# Patient Record
Sex: Male | Born: 1985 | Race: Black or African American | Hispanic: No | Marital: Single | Smoking: Never smoker
Health system: Southern US, Community
[De-identification: ages and names within clinical notes are randomized; demographics above are authoritative.]

## PROBLEM LIST (undated history)

## (undated) DIAGNOSIS — I509 Heart failure, unspecified: Secondary | ICD-10-CM

## (undated) DIAGNOSIS — I10 Essential (primary) hypertension: Secondary | ICD-10-CM

## (undated) DIAGNOSIS — E7800 Pure hypercholesterolemia, unspecified: Secondary | ICD-10-CM

## (undated) DIAGNOSIS — E119 Type 2 diabetes mellitus without complications: Secondary | ICD-10-CM

## (undated) HISTORY — PX: NO PAST SURGERIES: SHX2092

---

## 2009-06-02 ENCOUNTER — Emergency Department (HOSPITAL_COMMUNITY): Admission: EM | Admit: 2009-06-02 | Discharge: 2009-06-02 | Payer: Self-pay | Admitting: Emergency Medicine

## 2014-09-16 ENCOUNTER — Encounter (HOSPITAL_COMMUNITY): Payer: Self-pay | Admitting: *Deleted

## 2014-09-16 ENCOUNTER — Inpatient Hospital Stay (HOSPITAL_COMMUNITY)
Admission: EM | Admit: 2014-09-16 | Discharge: 2014-09-18 | DRG: 637 | Disposition: A | Discharge status: Home / Self Care | Payer: 59 | Attending: Internal Medicine | Admitting: Internal Medicine

## 2014-09-16 ENCOUNTER — Encounter (HOSPITAL_COMMUNITY): Payer: Self-pay | Admitting: Family Medicine

## 2014-09-16 ENCOUNTER — Encounter: Payer: Self-pay | Admitting: Podiatry

## 2014-09-16 ENCOUNTER — Ambulatory Visit (INDEPENDENT_AMBULATORY_CARE_PROVIDER_SITE_OTHER): Payer: 59

## 2014-09-16 ENCOUNTER — Ambulatory Visit (INDEPENDENT_AMBULATORY_CARE_PROVIDER_SITE_OTHER): Payer: 59 | Admitting: Podiatry

## 2014-09-16 VITALS — BP 138/73 | HR 67 | Temp 99.8°F | Resp 17

## 2014-09-16 DIAGNOSIS — L0390 Cellulitis, unspecified: Secondary | ICD-10-CM | POA: Diagnosis present

## 2014-09-16 DIAGNOSIS — R52 Pain, unspecified: Secondary | ICD-10-CM

## 2014-09-16 DIAGNOSIS — E1165 Type 2 diabetes mellitus with hyperglycemia: Secondary | ICD-10-CM | POA: Diagnosis present

## 2014-09-16 DIAGNOSIS — Z9114 Patient's other noncompliance with medication regimen: Secondary | ICD-10-CM | POA: Diagnosis present

## 2014-09-16 DIAGNOSIS — A419 Sepsis, unspecified organism: Secondary | ICD-10-CM | POA: Diagnosis present

## 2014-09-16 DIAGNOSIS — IMO0001 Reserved for inherently not codable concepts without codable children: Secondary | ICD-10-CM | POA: Diagnosis present

## 2014-09-16 DIAGNOSIS — M86171 Other acute osteomyelitis, right ankle and foot: Secondary | ICD-10-CM

## 2014-09-16 DIAGNOSIS — E780 Pure hypercholesterolemia: Secondary | ICD-10-CM | POA: Diagnosis present

## 2014-09-16 DIAGNOSIS — I10 Essential (primary) hypertension: Secondary | ICD-10-CM | POA: Diagnosis present

## 2014-09-16 DIAGNOSIS — E11621 Type 2 diabetes mellitus with foot ulcer: Principal | ICD-10-CM | POA: Diagnosis present

## 2014-09-16 DIAGNOSIS — L03115 Cellulitis of right lower limb: Secondary | ICD-10-CM | POA: Diagnosis present

## 2014-09-16 DIAGNOSIS — E1142 Type 2 diabetes mellitus with diabetic polyneuropathy: Secondary | ICD-10-CM | POA: Diagnosis present

## 2014-09-16 DIAGNOSIS — Z794 Long term (current) use of insulin: Secondary | ICD-10-CM

## 2014-09-16 DIAGNOSIS — L02611 Cutaneous abscess of right foot: Secondary | ICD-10-CM

## 2014-09-16 DIAGNOSIS — E1169 Type 2 diabetes mellitus with other specified complication: Secondary | ICD-10-CM | POA: Diagnosis present

## 2014-09-16 DIAGNOSIS — IMO0002 Reserved for concepts with insufficient information to code with codable children: Secondary | ICD-10-CM

## 2014-09-16 DIAGNOSIS — Z79899 Other long term (current) drug therapy: Secondary | ICD-10-CM | POA: Diagnosis not present

## 2014-09-16 DIAGNOSIS — E785 Hyperlipidemia, unspecified: Secondary | ICD-10-CM | POA: Diagnosis present

## 2014-09-16 DIAGNOSIS — L97519 Non-pressure chronic ulcer of other part of right foot with unspecified severity: Secondary | ICD-10-CM | POA: Diagnosis present

## 2014-09-16 DIAGNOSIS — L97509 Non-pressure chronic ulcer of other part of unspecified foot with unspecified severity: Secondary | ICD-10-CM

## 2014-09-16 DIAGNOSIS — M868X7 Other osteomyelitis, ankle and foot: Secondary | ICD-10-CM | POA: Diagnosis present

## 2014-09-16 DIAGNOSIS — L03119 Cellulitis of unspecified part of limb: Secondary | ICD-10-CM

## 2014-09-16 HISTORY — DX: Essential (primary) hypertension: I10

## 2014-09-16 HISTORY — DX: Type 2 diabetes mellitus without complications: E11.9

## 2014-09-16 HISTORY — DX: Pure hypercholesterolemia, unspecified: E78.00

## 2014-09-16 LAB — CBC WITH DIFFERENTIAL/PLATELET
BASOS ABS: 0 10*3/uL (ref 0.0–0.1)
Basophils Relative: 0 % (ref 0–1)
Eosinophils Absolute: 0.1 10*3/uL (ref 0.0–0.7)
Eosinophils Relative: 1 % (ref 0–5)
HCT: 40.1 % (ref 39.0–52.0)
Hemoglobin: 13.4 g/dL (ref 13.0–17.0)
Lymphocytes Relative: 17 % (ref 12–46)
Lymphs Abs: 2.5 10*3/uL (ref 0.7–4.0)
MCH: 26.9 pg (ref 26.0–34.0)
MCHC: 33.4 g/dL (ref 30.0–36.0)
MCV: 80.5 fL (ref 78.0–100.0)
Monocytes Absolute: 1.2 10*3/uL — ABNORMAL HIGH (ref 0.1–1.0)
Monocytes Relative: 8 % (ref 3–12)
NEUTROS ABS: 10.9 10*3/uL — AB (ref 1.7–7.7)
Neutrophils Relative %: 74 % (ref 43–77)
PLATELETS: 285 10*3/uL (ref 150–400)
RBC: 4.98 MIL/uL (ref 4.22–5.81)
RDW: 13.4 % (ref 11.5–15.5)
WBC: 14.7 10*3/uL — AB (ref 4.0–10.5)

## 2014-09-16 LAB — COMPREHENSIVE METABOLIC PANEL
ALK PHOS: 91 U/L (ref 39–117)
ALT: 18 U/L (ref 0–53)
AST: 13 U/L (ref 0–37)
Albumin: 4 g/dL (ref 3.5–5.2)
Anion gap: 12 (ref 5–15)
BUN: 11 mg/dL (ref 6–23)
CALCIUM: 10 mg/dL (ref 8.4–10.5)
CO2: 27 mEq/L (ref 19–32)
Chloride: 98 mEq/L (ref 96–112)
Creatinine, Ser: 0.86 mg/dL (ref 0.50–1.35)
GFR calc Af Amer: >90 mL/min (ref >90)
Glucose, Bld: 285 mg/dL — ABNORMAL HIGH (ref 70–99)
Potassium: 4.5 mEq/L (ref 3.7–5.3)
SODIUM: 137 meq/L (ref 137–147)
Total Bilirubin: 0.4 mg/dL (ref 0.3–1.2)
Total Protein: 8.3 g/dL (ref 6.0–8.3)

## 2014-09-16 LAB — GLUCOSE, CAPILLARY: Glucose-Capillary: 288 mg/dL — ABNORMAL HIGH (ref 70–99)

## 2014-09-16 LAB — CBG MONITORING, ED
GLUCOSE-CAPILLARY: 158 mg/dL — AB (ref 70–99)
Glucose-Capillary: 278 mg/dL — ABNORMAL HIGH (ref 70–99)

## 2014-09-16 LAB — HEMOGLOBIN A1C
Hgb A1c MFr Bld: 10 % — ABNORMAL HIGH (ref <5.7)
MEAN PLASMA GLUCOSE: 240 mg/dL — AB (ref <117)

## 2014-09-16 LAB — SEDIMENTATION RATE: Sed Rate: 38 mm/hr — ABNORMAL HIGH (ref 0–16)

## 2014-09-16 MED ORDER — ENOXAPARIN SODIUM 40 MG/0.4ML ~~LOC~~ SOLN
40.0000 mg | SUBCUTANEOUS | Status: DC
  Administered 2014-09-16 – 2014-09-17 (×2): 40 mg via SUBCUTANEOUS
  Filled 2014-09-16 (×5): qty 0.4

## 2014-09-16 MED ORDER — HYDROCODONE-ACETAMINOPHEN 5-325 MG PO TABS
1.0000 | ORAL_TABLET | ORAL | Status: DC | PRN
  Filled 2014-09-16: qty 2

## 2014-09-16 MED ORDER — VANCOMYCIN HCL IN DEXTROSE 1-5 GM/200ML-% IV SOLN
1000.0000 mg | Freq: Once | INTRAVENOUS | Status: AC
  Administered 2014-09-16: 1000 mg via INTRAVENOUS
  Filled 2014-09-16: qty 200

## 2014-09-16 MED ORDER — SODIUM CHLORIDE 0.9 % IV BOLUS (SEPSIS)
1000.0000 mL | Freq: Once | INTRAVENOUS | Status: AC
  Administered 2014-09-16: 1000 mL via INTRAVENOUS

## 2014-09-16 MED ORDER — ATORVASTATIN CALCIUM 20 MG PO TABS
20.0000 mg | ORAL_TABLET | Freq: Every day | ORAL | Status: DC
  Administered 2014-09-17: 20 mg via ORAL
  Filled 2014-09-16: qty 1
  Filled 2014-09-16: qty 2
  Filled 2014-09-16: qty 1

## 2014-09-16 MED ORDER — SODIUM CHLORIDE 0.9 % IV SOLN
INTRAVENOUS | Status: DC
  Administered 2014-09-16: 17:00:00 via INTRAVENOUS

## 2014-09-16 MED ORDER — SODIUM CHLORIDE 0.9 % IV SOLN
INTRAVENOUS | Status: DC
  Administered 2014-09-16: 21:00:00 via INTRAVENOUS

## 2014-09-16 MED ORDER — DEXTROSE 5 % IV SOLN
2.0000 g | INTRAVENOUS | Status: DC
  Administered 2014-09-16 – 2014-09-17 (×2): 2 g via INTRAVENOUS
  Filled 2014-09-16 (×4): qty 2

## 2014-09-16 MED ORDER — LINAGLIPTIN 5 MG PO TABS
5.0000 mg | ORAL_TABLET | Freq: Every day | ORAL | Status: DC
  Administered 2014-09-17: 5 mg via ORAL
  Filled 2014-09-16: qty 1

## 2014-09-16 MED ORDER — PIPERACILLIN-TAZOBACTAM 4.5 G IVPB
4.5000 g | Freq: Once | INTRAVENOUS | Status: AC
  Administered 2014-09-16: 4.5 g via INTRAVENOUS
  Filled 2014-09-16: qty 100

## 2014-09-16 MED ORDER — AMLODIPINE BESY-BENAZEPRIL HCL 10-40 MG PO CAPS: 1.0000 | ORAL_CAPSULE | Freq: Every day | ORAL | Status: DC

## 2014-09-16 MED ORDER — BENAZEPRIL HCL 40 MG PO TABS
40.0000 mg | ORAL_TABLET | Freq: Every day | ORAL | Status: DC
Start: 2014-09-16 — End: 2014-09-18
  Administered 2014-09-16 – 2014-09-18 (×3): 40 mg via ORAL
  Filled 2014-09-16 (×4): qty 1

## 2014-09-16 MED ORDER — SITAGLIPTIN PHOS-METFORMIN HCL 50-1000 MG PO TABS: 1.0000 | ORAL_TABLET | Freq: Two times a day (BID) | ORAL | Status: DC

## 2014-09-16 MED ORDER — GLIPIZIDE 10 MG PO TABS
10.0000 mg | ORAL_TABLET | Freq: Two times a day (BID) | ORAL | Status: DC
Start: 2014-09-17 — End: 2014-09-17
  Administered 2014-09-17: 10 mg via ORAL
  Filled 2014-09-16 (×3): qty 1

## 2014-09-16 MED ORDER — ONDANSETRON HCL 4 MG/2ML IJ SOLN: 4.0000 mg | Freq: Four times a day (QID) | INTRAMUSCULAR | Status: DC | PRN

## 2014-09-16 MED ORDER — METFORMIN HCL 500 MG PO TABS
1000.0000 mg | ORAL_TABLET | Freq: Two times a day (BID) | ORAL | Status: DC
  Administered 2014-09-17: 1000 mg via ORAL
  Filled 2014-09-16 (×3): qty 2

## 2014-09-16 MED ORDER — ONDANSETRON HCL 4 MG PO TABS: 4.0000 mg | ORAL_TABLET | Freq: Four times a day (QID) | ORAL | Status: DC | PRN

## 2014-09-16 MED ORDER — INSULIN ASPART 100 UNIT/ML ~~LOC~~ SOLN
8.0000 [IU] | Freq: Once | SUBCUTANEOUS | Status: AC
  Administered 2014-09-16: 8 [IU] via INTRAVENOUS
  Filled 2014-09-16: qty 1

## 2014-09-16 MED ORDER — DAPAGLIFLOZIN PROPANEDIOL 10 MG PO TABS: 10.0000 mg | ORAL_TABLET | Freq: Every day | ORAL | Status: DC

## 2014-09-16 MED ORDER — METRONIDAZOLE IN NACL 5-0.79 MG/ML-% IV SOLN
500.0000 mg | Freq: Three times a day (TID) | INTRAVENOUS | Status: DC
  Administered 2014-09-16 – 2014-09-18 (×6): 500 mg via INTRAVENOUS
  Filled 2014-09-16 (×8): qty 100

## 2014-09-16 MED ORDER — AMLODIPINE BESYLATE 10 MG PO TABS
10.0000 mg | ORAL_TABLET | Freq: Every day | ORAL | Status: DC
Start: 2014-09-16 — End: 2014-09-18
  Administered 2014-09-16 – 2014-09-18 (×3): 10 mg via ORAL
  Filled 2014-09-16 (×3): qty 1
  Filled 2014-09-16: qty 2

## 2014-09-16 NOTE — H&P (Signed)
[facility] History and Physical  Jeffery Villa UQJ:[id number] DOB: 01/15/1986 DOA: 09/16/2014  Referring physician: ED physician PCP: No PCP Per Patient   Chief Complaint: right foot ulcer   HPI:  Pt is 28 yo male with HTN, DM type II, presented to [facility] ED from [place] office for evaluation of diabetic foot ulcer. Pt explains he has noted the small blister like wound on the side of the right foot for several weeks and has gotten progressively worse, associated with malodorous discharge, throbbing pain present only with ambulation and applying pressure to the area, also progressive erythema around the ulcer, warmth to touch noted. Pt also reports subjective fevers, chills but has not checked the temperature. He explains he ran out of the diabetic medication several days ago.  In ED, pt is hemodynamically stable, VSS, report from doctor's office indicated XRAY of the right foot with osteophyte lysis of the fifth metatarsal head c/w osteomyelitis. TRH asked to admit for further evaluation.   Assessment and Plan: Active Problems: Diabetic foot ulcer with osteomyelitis, moderate to severe - SIRS criteria met with T 99.9 F, WBC 14. 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   Code Status: Full Family Communication: Pt at bedside Disposition Plan: Admit for further evaluation     Review of Systems:  Constitutional: Negative for fever,  chills and malaise/fatigue. Negative for diaphoresis.  HENT: Negative for hearing loss, ear pain, nosebleeds, congestion, sore throat, neck pain, tinnitus and ear discharge.   Eyes: Negative for blurred vision, double vision, photophobia, pain, discharge and redness.  Respiratory: Negative for cough, hemoptysis, sputum production, shortness of breath, wheezing and stridor.   Cardiovascular: Negative for chest pain, palpitations, orthopnea, claudication and leg swelling.  Gastrointestinal: Negative for nausea, vomiting and abdominal pain.  Genitourinary: Negative for dysuria, urgency, frequency, hematuria and flank pain.  Musculoskeletal: Negative for myalgias, back pain, joint pain and falls.  Skin: Negative for itching and rash.  Neurological: Negative for dizziness and weakness.  Endo/Heme/Allergies: Negative for environmental allergies and polydipsia. Does not bruise/bleed easily.  Psychiatric/Behavioral: Negative for suicidal ideas. The patient is not nervous/anxious.      Past Medical History  Diagnosis Date  . Hypertension   . Diabetes mellitus without complication   . High cholesterol     History reviewed. No pertinent past surgical history.  Social History:  reports that he has never smoked. He does not have any smokeless tobacco history on file. His alcohol and drug histories are not on file.  No Known Allergies  No known family medical history   Prior to Admission medications   Medication Sig Start Date End Date Taking? Authorizing Provider  acetaminophen (TYLENOL) 325 MG tablet Take 650 mg by mouth every 6 (six) hours as needed.   Yes Historical Provider, MD  glipiZIDE (GLUCOTROL) 10 MG tablet Take 10 mg by mouth 2 (two) times daily.   Yes Historical Provider, MD  amLODipine-benazepril (LOTREL) 10-40 MG per  capsule Take 1 capsule by mouth daily.    Historical Provider, MD  atorvastatin (LIPITOR) 20 MG tablet Take 20 mg by mouth daily.    Historical Provider, MD   dapagliflozin propanediol (FARXIGA) 10 MG TABS tablet Take 10 mg by mouth daily.    Historical Provider, MD  sitaGLIPtin-metformin (JANUMET) 50-1000 MG per tablet Take 1 tablet by mouth 2 (two) times daily with a meal.    Historical Provider, MD    Physical Exam: Filed Vitals:   09/16/14 1554 09/16/14 1730  BP: 156/93 156/87  Pulse: 118 107  Temp: 99.4 F (37.4 C)   TempSrc: Oral   Resp: 18   SpO2: 98% 98%    Physical Exam  Constitutional: Appears well-developed and well-nourished. No distress.  HENT: Normocephalic. External right and left ear normal. Oropharynx is clear and moist.  Eyes: Conjunctivae and EOM are normal. PERRLA, no scleral icterus.  Neck: Normal ROM. Neck supple. No JVD. No tracheal deviation. No thyromegaly.  CVS: RRR, S1/S2 +, no murmurs, no gallops, no carotid bruit.  Pulmonary: Effort and breath sounds normal, no stridor, rhonchi, wheezes, rales.  Abdominal: Soft. BS +,  no distension, tenderness, rebound or guarding.  Musculoskeletal: Normal range of motion. Lateral aspect of the right foot with small open ulcer with purulent drainage, TTP and with surrounding erythema and warmth to touch  Lymphadenopathy: No lymphadenopathy noted, cervical, inguinal. Neuro: Alert. Normal reflexes, muscle tone coordination. No cranial nerve deficit. Skin: Skin is warm and dry. No rash noted. Not diaphoretic. No erythema. No pallor.  Psychiatric: Normal mood and affect. Behavior, judgment, thought content normal.   Labs on Admission:  Basic Metabolic Panel:  Recent Labs Lab 09/16/14 1620  NA 137  K 4.5  CL 98  CO2 27  GLUCOSE 285*  BUN 11  CREATININE 0.86  CALCIUM 10.0   Liver Function Tests:  Recent Labs Lab 09/16/14 1620  AST 13  ALT 18  ALKPHOS 91  BILITOT 0.4  PROT 8.3  ALBUMIN 4.0   No results for input(s): LIPASE, AMYLASE in the last 168 hours. No results for input(s): AMMONIA in the last 168 hours. CBC:  Recent Labs Lab 09/16/14 1620  WBC  14.7*  NEUTROABS 10.9*  HGB 13.4  HCT 40.1  MCV 80.5  PLT 285   Cardiac Enzymes: No results for input(s): CKTOTAL, CKMB, CKMBINDEX, TROPONINI in the last 168 hours. BNP: Invalid input(s): POCBNP CBG:  Recent Labs Lab 09/16/14 1557  GLUCAP 278*    EKG: Normal sinus rhythm, no ST/T wave changes  Faye Ramsay, MD  [facility] Pager [phone]  If 7PM-7AM, please contact night-coverage [email or web address] Password [facility] 09/16/2014, 5:47 PM

## 2014-09-16 NOTE — Progress Notes (Signed)
   Subjective:    Patient ID: Jeffery Villa, male    DOB: [birth year], 28 y.o.   MRN: [id number]  HPI 28 year old male presents the office today with complaints of a wound on his right foot which has been present for proximally one month. He states that the last couple days he hasincrease malodor, drainage, swelling and redness to his right foot and leg. He denies any systemic complaints as fevers, chills, nausea, vomiting. He is diabetic and he states his last HbA1c was 8.0. Does state that he is diabetic on insulin however he ran out of medication on November 18 and he has since not had any medication. He states that since run out of the insulin he has noticed increased symptoms. He states that he periodically gets calluses on both of his feet for which he picks at. No other complaints at this time.  Review of Systems  All other systems reviewed and are negative.      Objective:   Physical Exam AAO 3, NAD DP/PT pulses palpable bilaterally Protective sensation decreased with Simms Weinstein monofilament, Achilles tendon reflex intact Ulceration on the plantar aspect of the right foot submetatarsal 5. Small amount of sharp/scab overlying the wound with purulence identified. The area was then sharply debrided and there was probing to bone. There is malodor present. There is edema and erythema from the foot to mid leg. There is pain on palpation to the right leg. There is increase in warmth to the foot/leg. There is no specific areas of fluctuance or crepitus.  No other open lesions identified.        Assessment & Plan:  28 year old male with bilateral x-ray cellulitis, likely osteomyelitis fifth metatarsal head  -X-rays were obtained and reviewed with the patient. There is osteophyte lysis of the fifth metatarsal head consistent with early osteomyelitis.  -Treatment options were discussed. At this time recommend the patient go to the emergency department for further evaluation due to the  significant swelling to his right leg in warmth consistent with cellulitis as well as likely osteomyelitis.  -I discussed the patient that for future it is best if he follows up with me on a routine basis every 3 months to help prevent recurrence or any future problems.  -Patient verbalizes understanding to go to the hospital.

## 2014-09-16 NOTE — ED Notes (Signed)
Per pt sts worsening wound infection to right foot. sts that he has diabetes and blood sugars have been high. sts wound oozing and hard to walk on.

## 2014-09-16 NOTE — Patient Instructions (Signed)
Due to the warmth/swelling to the right leg, the wound on the bottom on the right foot with some purulence and changes on x-ray concerning for a bone infection, I recommend going to the emergency room for further evaluation.

## 2014-09-16 NOTE — ED Provider Notes (Signed)
CSN: [id number]     Arrival date & time 09/16/14  1549 History   First MD Initiated Contact with Patient 09/16/14 1604     Chief Complaint  Patient presents with  . Wound Infection     (Consider location/radiation/quality/duration/timing/severity/associated sxs/prior Treatment) The history is provided by the patient.  pt w hx diabetes, c/o diabetic foot ulcer for past several weeks, that has been slowly looking worse.  Ulcer is on plantar aspect foot, proximal to base of small toe. Pt states started as small hole that has slowly gotten larger. Now states entire foot and lower leg is 'red and hot'. Mild pain to area. Minimal purulent drainage. Was seen at foot center today and referred to ED. Denies fever or chills. States non compliant w diabetes management, has not been on meds or checked sugars for weeks/months. No nv.      Past Medical History  Diagnosis Date  . Hypertension   . Diabetes mellitus without complication   . High cholesterol    History reviewed. No pertinent past surgical history. History reviewed. No pertinent family history. History  Substance Use Topics  . Smoking status: Never Smoker   . Smokeless tobacco: Not on file  . Alcohol Use: Not on file    Review of Systems  Constitutional: Negative for fever.  HENT: Negative for sore throat.   Eyes: Negative for redness.  Respiratory: Negative for shortness of breath.   Cardiovascular: Negative for chest pain.  Gastrointestinal: Negative for vomiting, abdominal pain and diarrhea.  Endocrine: Negative for polydipsia and polyuria.  Genitourinary: Negative for flank pain.  Musculoskeletal: Negative for back pain and neck pain.  Skin: Negative for rash.  Neurological: Negative for headaches.  Hematological: Does not bruise/bleed easily.  Psychiatric/Behavioral: Negative for confusion.      Allergies  Review of patient's allergies indicates no known allergies.  Home Medications   Prior to Admission  medications   Medication Sig Start Date End Date Taking? Authorizing Provider  amLODipine-benazepril (LOTREL) 10-40 MG per capsule Take 1 capsule by mouth daily.    Historical Provider, MD  atorvastatin (LIPITOR) 20 MG tablet Take 20 mg by mouth daily.    Historical Provider, MD  dapagliflozin propanediol (FARXIGA) 10 MG TABS tablet Take 10 mg by mouth daily.    Historical Provider, MD  sitaGLIPtin-metformin (JANUMET) 50-1000 MG per tablet Take 1 tablet by mouth 2 (two) times daily with a meal.    Historical Provider, MD   BP 156/93 mmHg  Pulse 118  Temp(Src) 99.4 F (37.4 C) (Oral)  Resp 18  SpO2 98% Physical Exam  Constitutional: He is oriented to person, place, and time. He appears well-developed and well-nourished. No distress.  HENT:  Mouth/Throat: Oropharynx is clear and moist.  Eyes: Conjunctivae are normal. No scleral icterus.  Neck: Neck supple. No tracheal deviation present.  Cardiovascular: Normal rate, regular rhythm, normal heart sounds and intact distal pulses.   Pulmonary/Chest: Effort normal and breath sounds normal. No accessory muscle usage. No respiratory distress.  Abdominal: Soft. Bowel sounds are normal. He exhibits no distension.  Musculoskeletal: Normal range of motion. He exhibits no edema or tenderness.  3-4 mm diameter ulcerated area/hole on plantar aspect right foot proximal to base right small toe. Surrounding callus. Foot and lower leg w erythema and increased warmth. Distal pulses palp. Normal cap refill distally.   Neurological: He is alert and oriented to person, place, and time.  Skin: Skin is warm and dry. No rash noted.  Psychiatric: He has  a normal mood and affect.  Nursing note and vitals reviewed.   ED Course  Procedures (including critical care time) Labs Review   Results for orders placed or performed during the hospital encounter of 09/16/14  CBC with Differential  Result Value Ref Range   WBC 14.7 (H) 4.0 - 10.5 K/uL   RBC 4.98 4.22 -  5.81 MIL/uL   Hemoglobin 13.4 13.0 - 17.0 g/dL   HCT 78.240.1 95.639.0 - 21.352.0 %   MCV 80.5 78.0 - 100.0 fL   MCH 26.9 26.0 - 34.0 pg   MCHC 33.4 30.0 - 36.0 g/dL   RDW 08.613.4 57.811.5 - 46.915.5 %   Platelets 285 150 - 400 K/uL   Neutrophils Relative % 74 43 - 77 %   Neutro Abs 10.9 (H) 1.7 - 7.7 K/uL   Lymphocytes Relative 17 12 - 46 %   Lymphs Abs 2.5 0.7 - 4.0 K/uL   Monocytes Relative 8 3 - 12 %   Monocytes Absolute 1.2 (H) 0.1 - 1.0 K/uL   Eosinophils Relative 1 0 - 5 %   Eosinophils Absolute 0.1 0.0 - 0.7 K/uL   Basophils Relative 0 0 - 1 %   Basophils Absolute 0.0 0.0 - 0.1 K/uL  Comprehensive metabolic panel  Result Value Ref Range   Sodium 137 137 - 147 mEq/L   Potassium 4.5 3.7 - 5.3 mEq/L   Chloride 98 96 - 112 mEq/L   CO2 27 19 - 32 mEq/L   Glucose, Bld 285 (H) 70 - 99 mg/dL   BUN 11 6 - 23 mg/dL   Creatinine, Ser 6.290.86 0.50 - 1.35 mg/dL   Calcium 52.810.0 8.4 - 41.310.5 mg/dL   Total Protein 8.3 6.0 - 8.3 g/dL   Albumin 4.0 3.5 - 5.2 g/dL   AST 13 0 - 37 U/L   ALT 18 0 - 53 U/L   Alkaline Phosphatase 91 39 - 117 U/L   Total Bilirubin 0.4 0.3 - 1.2 mg/dL   GFR calc non Af Amer >90 >90 mL/min   GFR calc Af Amer >90 >90 mL/min   Anion gap 12 5 - 15  CBG monitoring, ED  Result Value Ref Range   Glucose-Capillary 278 (H) 70 - 99 mg/dL      MDM  Iv ns.   Labs.  Reviewed nursing notes and prior charts for additional history.   Given cellulitis in setting infected diabetic foot wound, ?underlying osteo, uncontrolled diabetes in patient w poor compliance and no pcp - will admit to med service for iv abx, wound care/consult, and diabetes management/teaching.  Iv zosyn and vanc in ED.  Iv ns bolus. novolog sq.     Suzi RootsKevin E Yardley Beltran, MD 09/16/14 [phone]

## 2014-09-17 ENCOUNTER — Inpatient Hospital Stay (HOSPITAL_COMMUNITY): Payer: 59

## 2014-09-17 DIAGNOSIS — M9080 Osteopathy in diseases classified elsewhere, unspecified site: Secondary | ICD-10-CM

## 2014-09-17 DIAGNOSIS — E1169 Type 2 diabetes mellitus with other specified complication: Secondary | ICD-10-CM

## 2014-09-17 DIAGNOSIS — E11621 Type 2 diabetes mellitus with foot ulcer: Principal | ICD-10-CM

## 2014-09-17 DIAGNOSIS — I10 Essential (primary) hypertension: Secondary | ICD-10-CM

## 2014-09-17 DIAGNOSIS — A419 Sepsis, unspecified organism: Secondary | ICD-10-CM

## 2014-09-17 DIAGNOSIS — M869 Osteomyelitis, unspecified: Secondary | ICD-10-CM

## 2014-09-17 DIAGNOSIS — L97509 Non-pressure chronic ulcer of other part of unspecified foot with unspecified severity: Secondary | ICD-10-CM

## 2014-09-17 DIAGNOSIS — E1165 Type 2 diabetes mellitus with hyperglycemia: Secondary | ICD-10-CM

## 2014-09-17 LAB — GLUCOSE, CAPILLARY
GLUCOSE-CAPILLARY: 283 mg/dL — AB (ref 70–99)
GLUCOSE-CAPILLARY: 161 mg/dL — AB (ref 70–99)
Glucose-Capillary: 335 mg/dL — ABNORMAL HIGH (ref 70–99)
Glucose-Capillary: 168 mg/dL — ABNORMAL HIGH (ref 70–99)

## 2014-09-17 LAB — CBC
HCT: 37.2 % — ABNORMAL LOW (ref 39.0–52.0)
Hemoglobin: 12.4 g/dL — ABNORMAL LOW (ref 13.0–17.0)
MCH: 26.8 pg (ref 26.0–34.0)
MCHC: 33.3 g/dL (ref 30.0–36.0)
MCV: 80.5 fL (ref 78.0–100.0)
PLATELETS: 264 10*3/uL (ref 150–400)
RBC: 4.62 MIL/uL (ref 4.22–5.81)
RDW: 13.6 % (ref 11.5–15.5)
WBC: 10.3 10*3/uL (ref 4.0–10.5)

## 2014-09-17 LAB — BASIC METABOLIC PANEL
ANION GAP: 14 (ref 5–15)
BUN: 10 mg/dL (ref 6–23)
CO2: 24 mEq/L (ref 19–32)
Calcium: 9.1 mg/dL (ref 8.4–10.5)
Chloride: 99 mEq/L (ref 96–112)
Creatinine, Ser: 0.78 mg/dL (ref 0.50–1.35)
GFR calc non Af Amer: >90 mL/min (ref >90)
GLUCOSE: 323 mg/dL — AB (ref 70–99)
POTASSIUM: 4 meq/L (ref 3.7–5.3)
Sodium: 137 mEq/L (ref 137–147)

## 2014-09-17 LAB — HEMOGLOBIN A1C
Hgb A1c MFr Bld: 10.4 % — ABNORMAL HIGH (ref <5.7)
Mean Plasma Glucose: 252 mg/dL — ABNORMAL HIGH (ref <117)

## 2014-09-17 MED ORDER — HYDROCODONE-ACETAMINOPHEN 5-325 MG PO TABS
1.0000 | ORAL_TABLET | ORAL | Status: DC | PRN
  Administered 2014-09-17: 1 via ORAL
  Filled 2014-09-17: qty 1

## 2014-09-17 MED ORDER — INSULIN ASPART 100 UNIT/ML ~~LOC~~ SOLN
0.0000 [IU] | Freq: Three times a day (TID) | SUBCUTANEOUS | Status: DC
  Administered 2014-09-17: 3 [IU] via SUBCUTANEOUS
  Administered 2014-09-18 (×2): 5 [IU] via SUBCUTANEOUS

## 2014-09-17 MED ORDER — INSULIN GLARGINE 100 UNIT/ML ~~LOC~~ SOLN
20.0000 [IU] | Freq: Every day | SUBCUTANEOUS | Status: DC
  Administered 2014-09-17 – 2014-09-18 (×2): 20 [IU] via SUBCUTANEOUS
  Filled 2014-09-17 (×2): qty 0.2

## 2014-09-17 MED ORDER — INSULIN ASPART 100 UNIT/ML ~~LOC~~ SOLN
3.0000 [IU] | Freq: Three times a day (TID) | SUBCUTANEOUS | Status: DC
  Administered 2014-09-18 (×2): 3 [IU] via SUBCUTANEOUS

## 2014-09-17 MED ORDER — INSULIN ASPART 100 UNIT/ML ~~LOC~~ SOLN: 0.0000 [IU] | Freq: Every day | SUBCUTANEOUS | Status: DC

## 2014-09-17 MED ORDER — ADULT MULTIVITAMIN W/MINERALS CH
1.0000 | ORAL_TABLET | Freq: Every day | ORAL | Status: DC
  Administered 2014-09-18: 1 via ORAL
  Filled 2014-09-17: qty 1

## 2014-09-17 NOTE — Consult Note (Signed)
Reason for Consult: right foot diabetic ulcer Referring Physician: Abigail Marsiglia Jeffery Villa is an 28 y.o. male.  HPI: 28 year old male with a new-onset diabetic ulcer under the right 5th toe MTP joint. Patient reports increase in pain and temperature of the foot over the last few days. The patient was seen at the foot center and a simple debridement of the ulcer was performed.  Due to a worsening clinical course, the patient presented to the ED and was admitted for IV antibiotics and further clinical workup.   Past Medical History  Diagnosis Date  . Hypertension   . Diabetes mellitus without complication   . High cholesterol     History reviewed. No pertinent past surgical history.  History reviewed. No pertinent family history.  Social History:  reports that he has never smoked. He does not have any smokeless tobacco history on file. His alcohol and drug histories are not on file.  Allergies: No Known Allergies  Medications: I have reviewed the patient's current medications.  Results for orders placed or performed during the hospital encounter of 09/16/14 (from the past 48 hour(s))  CBG monitoring, ED     Status: Abnormal   Collection Time: 09/16/14  3:57 PM  Result Value Ref Range   Glucose-Capillary 278 (H) 70 - 99 mg/dL  CBC with Differential     Status: Abnormal   Collection Time: 09/16/14  4:20 PM  Result Value Ref Range   WBC 14.7 (H) 4.0 - 10.5 K/uL   RBC 4.98 4.22 - 5.81 MIL/uL   Hemoglobin 13.4 13.0 - 17.0 g/dL   HCT 40.1 39.0 - 52.0 %   MCV 80.5 78.0 - 100.0 fL   MCH 26.9 26.0 - 34.0 pg   MCHC 33.4 30.0 - 36.0 g/dL   RDW 13.4 11.5 - 15.5 %   Platelets 285 150 - 400 K/uL   Neutrophils Relative % 74 43 - 77 %   Neutro Abs 10.9 (H) 1.7 - 7.7 K/uL   Lymphocytes Relative 17 12 - 46 %   Lymphs Abs 2.5 0.7 - 4.0 K/uL   Monocytes Relative 8 3 - 12 %   Monocytes Absolute 1.2 (H) 0.1 - 1.0 K/uL   Eosinophils Relative 1 0 - 5 %   Eosinophils Absolute 0.1 0.0 - 0.7  K/uL   Basophils Relative 0 0 - 1 %   Basophils Absolute 0.0 0.0 - 0.1 K/uL  Comprehensive metabolic panel     Status: Abnormal   Collection Time: 09/16/14  4:20 PM  Result Value Ref Range   Sodium 137 137 - 147 mEq/L   Potassium 4.5 3.7 - 5.3 mEq/L   Chloride 98 96 - 112 mEq/L   CO2 27 19 - 32 mEq/L   Glucose, Bld 285 (H) 70 - 99 mg/dL   BUN 11 6 - 23 mg/dL   Creatinine, Ser 0.86 0.50 - 1.35 mg/dL   Calcium 10.0 8.4 - 10.5 mg/dL   Total Protein 8.3 6.0 - 8.3 g/dL   Albumin 4.0 3.5 - 5.2 g/dL   AST 13 0 - 37 U/L   ALT 18 0 - 53 U/L   Alkaline Phosphatase 91 39 - 117 U/L   Total Bilirubin 0.4 0.3 - 1.2 mg/dL   GFR calc non Af Amer >90 >90 mL/min   GFR calc Af Amer >90 >90 mL/min    Comment: (NOTE) The eGFR has been calculated using the CKD EPI equation. This calculation has not been validated in all clinical situations.  eGFR's persistently <90 mL/min signify possible Chronic Kidney Disease.    Anion gap 12 5 - 15  Hemoglobin A1c     Status: Abnormal   Collection Time: 09/16/14  4:20 PM  Result Value Ref Range   Hgb A1c MFr Bld 10.0 (H) <5.7 %    Comment: (NOTE)                                                                       According to the ADA Clinical Practice Recommendations for 2011, when HbA1c is used as a screening test:  >=6.5%   Diagnostic of Diabetes Mellitus           (if abnormal result is confirmed) 5.7-6.4%   Increased risk of developing Diabetes Mellitus References:Diagnosis and Classification of Diabetes Mellitus,Diabetes TXHF,4142,39(RVUYE 1):S62-S69 and Standards of Medical Care in         Diabetes - 2011,Diabetes Care,2011,34 (Suppl 1):S11-S61.    Mean Plasma Glucose 240 (H) <117 mg/dL    Comment: Performed at [facility]  CBG monitoring, ED     Status: Abnormal   Collection Time: 09/16/14  7:16 PM  Result Value Ref Range   Glucose-Capillary 158 (H) 70 - 99 mg/dL   Comment 1 Notify RN    Comment 2 Documented in Chart   Sedimentation  rate     Status: Abnormal   Collection Time: 09/16/14  8:21 PM  Result Value Ref Range   Sed Rate 38 (H) 0 - 16 mm/hr  Glucose, capillary     Status: Abnormal   Collection Time: 09/16/14  9:59 PM  Result Value Ref Range   Glucose-Capillary 288 (H) 70 - 99 mg/dL   Comment 1 Notify RN   Basic metabolic panel     Status: Abnormal   Collection Time: 09/17/14  4:47 AM  Result Value Ref Range   Sodium 137 137 - 147 mEq/L   Potassium 4.0 3.7 - 5.3 mEq/L   Chloride 99 96 - 112 mEq/L   CO2 24 19 - 32 mEq/L   Glucose, Bld 323 (H) 70 - 99 mg/dL   BUN 10 6 - 23 mg/dL   Creatinine, Ser 0.78 0.50 - 1.35 mg/dL   Calcium 9.1 8.4 - 10.5 mg/dL   GFR calc non Af Amer >90 >90 mL/min   GFR calc Af Amer >90 >90 mL/min    Comment: (NOTE) The eGFR has been calculated using the CKD EPI equation. This calculation has not been validated in all clinical situations. eGFR's persistently <90 mL/min signify possible Chronic Kidney Disease.    Anion gap 14 5 - 15  CBC     Status: Abnormal   Collection Time: 09/17/14  4:47 AM  Result Value Ref Range   WBC 10.3 4.0 - 10.5 K/uL   RBC 4.62 4.22 - 5.81 MIL/uL   Hemoglobin 12.4 (L) 13.0 - 17.0 g/dL   HCT 37.2 (L) 39.0 - 52.0 %   MCV 80.5 78.0 - 100.0 fL   MCH 26.8 26.0 - 34.0 pg   MCHC 33.3 30.0 - 36.0 g/dL   RDW 13.6 11.5 - 15.5 %   Platelets 264 150 - 400 K/uL  Glucose, capillary     Status: Abnormal   Collection Time: 09/17/14  8:02 AM  Result Value Ref Range   Glucose-Capillary 335 (H) 70 - 99 mg/dL   Comment 1 Notify RN   Glucose, capillary     Status: Abnormal   Collection Time: 09/17/14 11:42 AM  Result Value Ref Range   Glucose-Capillary 283 (H) 70 - 99 mg/dL    Dg Foot Complete Right  09/16/2014   3 views of a skeletally mature individual were obtained of the right foot.  Study includes DP, oblique, lateral projections.  There is mild ostial lysis of the fifth metatarsal head consistent with  possible early osteomyelitis. No soft tissue  emphysema. Calcification seen  at the anterior aspect of the leg in the soft tissues.   ROS Blood pressure 148/96, pulse 98, temperature 98.9 F (37.2 C), temperature source Oral, resp. rate 16, height '6\' 2"'  (1.88 m), weight 103 kg (227 lb 1.2 oz), SpO2 98 %. Physical Exam  Right foot: generalized swelling of the foot and ankle.  Increased temperature of the right foot compared to the left foot.  There is a 1cm shallow ulcer under the 5th MT head.  No fluctuance and no active drainage.  No erythema noted.  Pulses present. No specific swelling in the 5th toe.  Assessment/Plan: Right foot ulcer in diabetic male with poor diabetic control.  Rule out osteomyelitis and abscess.  Recommend MRI at this time to evaluate for these possibilities.  Continue local wound care and antibiotics.  I have discussed this case with Dr Sharol Given who is available for surgical consultation if needed.  Jeffery Villa,STEVEN R 09/17/2014, 4:14 PM

## 2014-09-17 NOTE — Care Management (Signed)
09-17-14 Consult for PCP .   Confirmed patient does have [facility] insurance , faxed copy of card ( front and back ) to admitting .   Provided patient with [facility] phone number [phone] . Patient can call [facility] if he wants a [facility] MD in network , or he can call number on insurance card to be provided with a list of PCP's in network.   Explained to patient if he knows a MD he wants to see he can call MD office directly to see if he/ she are accepting new patients with UMR.   Also provided [facility] and [facility] information.   Patient stated he will decide on a MD.   Ronny Flurry RN BSN [phone]

## 2014-09-17 NOTE — Consult Note (Addendum)
WOC consulted for the neuropathic foot ulcer.  Evaluation per podiatry 09/16/14 as outpatient recommended inpatient admission for management.   Xray: There is mild ostial lysis of the fifth metatarsal head consistent with possible early osteomyelitis The treatment of osteomyelitis is considered outside of the scope of practice for the [facility] nurse. Would recommend orthopedic evaluation at this time.  May need MRI for further evaluation, contacted hospitalist for consultation to orthopedics.    Re consult if needed, will not follow at this time. Thanks  Thurmond Hildebran [employer], CWOCN [phone])

## 2014-09-17 NOTE — Plan of Care (Signed)
Problem: Food- and Nutrition-Related Knowledge Deficit (NB-1.1) Goal: Nutrition education Formal process to instruct or train a patient/client in a skill or to impart knowledge to help patients/clients voluntarily manage or modify food choices and eating behavior to maintain or improve health. Outcome: Completed/Met Date Met:  09/17/14  RD consulted for nutrition education regarding diabetes.   Pt has had DM since 15 but was non-compliant for a time. Pt plans to seek care from [facility] and [facility]. Pt ready to make changes and needs review of DM diet.  Pt with good appetite eating well.  Blood sugar control will be the most helpful for healing his ulcer.  Will add MVI.     Lab Results  Component Value Date    HGBA1C 10.0* 09/16/2014    RD provided "Carbohydrate Counting for People with Diabetes" handout from the Academy of Nutrition and Dietetics. Discussed different food groups and their effects on blood sugar, emphasizing carbohydrate-containing foods. Provided list of carbohydrates and recommended serving sizes of common foods.  Discussed importance of controlled and consistent carbohydrate intake throughout the day. Provided examples of ways to balance meals/snacks and encouraged intake of high-fiber, whole grain complex carbohydrates. Teach back method used.  Expect good compliance.  Body mass index is 29.14 kg/(m^2). Pt meets criteria for overweight based on current BMI.  Current diet order is CHO modified, patient is consuming approximately 100% of meals at this time. Labs and medications reviewed. No further nutrition interventions warranted at this time. RD contact information provided. If additional nutrition issues arise, please re-consult RD.  [place], [place], [place] Pager [phone] After Hours Pager

## 2014-09-17 NOTE — Plan of Care (Signed)
Problem: Phase I Progression Outcomes Goal: Pain controlled with appropriate interventions Outcome: Progressing     

## 2014-09-17 NOTE — Progress Notes (Addendum)
Inpatient Diabetes Program Recommendations  AACE/ADA: New Consensus Statement on Inpatient Glycemic Control (2013)  Target Ranges:  Prepandial:   less than 140 mg/dL      Peak postprandial:   less than 180 mg/dL (1-2 hours)      Critically ill patients:  140 - 180 mg/dL   Reason for Visit:  Referral received for Diabetes.  A1C=10.0%.    Diabetes history: Type 2 diabetes Outpatient Diabetes medications: Janumet 50-1000 mg bid, Glipizide 10 mg bid, Farxiga 10 mg daily Current orders for Inpatient glycemic control:   Glucotrol 10 mg bid, Tradgenta 5 mg daily, Metformin 1000 mg bid   CBG's elevated.  Note patient admitted for a diabetic foot ulcer.  Text-paged MD to request basal, bolus and correction insulin.  Ordered "Living Well with Diabetes" booklet for patient and will talk to patient regarding diabetes, survival skills and follow-up.  Thanks,  Beryl Meager, RN, BC-ADM Inpatient Diabetes Coordinator Pager [phone]   Addendum:  Spoke at length to patient regarding diabetes.  He has had diabetes since age 28 and states that he was on insulin until age 62.  When he went to college he stopped taking insulin and states "I was in denial and did not want to have diabetes any more".  He has seen MD at family practice in [place] who prescribed the oral diabetes medications for him.  He states that he does not have a meter and has not been checking CBG's but noticed that when he ran out of his medications on 11/17, his foot ulcer got much worse.  He states that he is motivated and wants to get his diabetes under control.  He states that he is willing to go back on insulin if necessary.  He has used insulin pens in the past.  Will need Rx. For medications and meter at discharge.  May also benefit from consult with endocrinologist? Will follow.  Gave him information regarding "Link to Wellness" program and also gave referral to [facility] case manager.

## 2014-09-17 NOTE — Progress Notes (Signed)
Received referral from DM Inpatient Coordinator for [facility] Care Mangement/ Link to Wellness program. Patient has [employer] insurance. Therefore, eligible for Link to Wellness program for DM, HTN, HLD. Fortunately DM Coordinator had already given patient packet and discussed program. Micah Flesher to bedside to reiterate the benefits of Link to [employer] and consents were signed. Patient endorses he does not have a PCP locally and that he has been without his medications since the November 17th. States he has tried to call his PCP in [place] without returned calls. Encouraged patient to make follow appointment with [facility] for follow up sooner than later. Discussed that he will need all of his prescriptions prior to discharge as he does not have any refills on his medications prior to admission. Patient will receive call for appointment to be arranged for Link to [facility] Coordinator for further folllow up. Left contact information and [facility] Care Management folder and Link to Wellness brochure with patient as well. Confirmed contact information.Will make inpatient RNCM aware of bedside encounter. Raiford Noble, MSN- RN,BSN- [facility] [phone]

## 2014-09-17 NOTE — Progress Notes (Signed)
PROGRESS NOTE    Jeffery Villa OTL:[id number] DOB: 26-Jun-1986 DOA: 09/16/2014 PCP: No PCP Per Patient  HPI/Brief narrative 28 year old male, security personnel at [employer], with history of essential hypertension, type II DM with possible peripheral neuropathy, Medication noncompliance, presented from wound care center to ED with complaints of worsening pain and drainage from chronic right foot wound. X-ray of right foot suggests osteomyelitis. Orthopedics/Dr. Veverly Fells consulted.  Assessment/Plan:  1. 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 Continue IV Rocephin and Flagyl as per foot ulcer protocol. Consulted Dr. Veverly Fells, orthopedics for further evaluation and management. 2. Poorly controlled type II DM with peripheral neuropathy: DC oral hypoglycemics while inpatient. Start patient on Lantus, NovoLog mealtime and SSI. Diabetes coordinator input is appreciated. Patient extensively counseled regarding need for compliance with all medical management to avoid further complications. He verbalized understanding. He has apparently been a diabetic since age 31 and used to be on insulins. We should discharge on insulin secondary to ongoing acute infection and A1C 10. 3. Essential hypertension: Mildly uncontrolled. Continue amlodipine, benazepril. 4. History of hyperlipidemia: Continue statins 5. Medication noncompliance: Counseled.   Code Status: Full Family Communication: None at bedside Disposition Plan: Home when medically stable   Consultants:  Orthopedics  Procedures:  None  Antibiotics:  IV Rocephin  IV Flagyl   Subjective: Mild right foot pain.  Objective: Filed Vitals:   09/16/14 1830 09/16/14 1900 09/16/14 1945 09/17/14 0531  BP: 134/84 142/95 154/97 144/90  Pulse: 100 104 102 93  Temp:   98.7 F (37.1 C) 98.9 F (37.2 C)    TempSrc:   Oral Oral  Resp:   18 17  Height:   '6\' 2"'  (1.88 m)   Weight:   103 kg (227 lb 1.2 oz)   SpO2: 97% 98% 97% 97%    Intake/Output Summary (Last 24 hours) at 09/17/14 1211 Last data filed at 09/17/14 1053  Gross per 24 hour  Intake 1413.75 ml  Output      0 ml  Net 1413.75 ml   Filed Weights   09/16/14 1945  Weight: 103 kg (227 lb 1.2 oz)     Exam:  General exam: pleasant moderately built and obese male patient sitting up comfortably in bed. Respiratory system: Clear. No increased work of breathing. Cardiovascular system: S1 & S2 heard, RRR. No JVD, murmurs, gallops, clicks or pedal edema. Gastrointestinal system: Abdomen is nondistended, soft and nontender. Normal bowel sounds heard. Central nervous system: Alert and oriented. No focal neurological deficits. Extremities: Symmetric 5 x 5 power. Approximately 1 cm diameter wound with callus around, plantar aspect of right fifth MTP with no tenderness or active drainage or other acute signs at this time.    Data Reviewed: Basic Metabolic Panel:  Recent Labs Lab 09/16/14 1620 09/17/14 0447  NA 137 137  K 4.5 4.0  CL 98 99  CO2 27 24  GLUCOSE 285* 323*  BUN 11 10  CREATININE 0.86 0.78  CALCIUM 10.0 9.1   Liver Function Tests:  Recent Labs Lab 09/16/14 1620  AST 13  ALT 18  ALKPHOS 91  BILITOT 0.4  PROT 8.3  ALBUMIN 4.0   No results for input(s): LIPASE, AMYLASE in the last 168 hours. No results for input(s): AMMONIA in the last 168 hours. CBC:  Recent Labs Lab 09/16/14 1620 09/17/14 0447  WBC 14.7* 10.3  NEUTROABS 10.9*  --  HGB 13.4 12.4*  HCT 40.1 37.2*  MCV 80.5 80.5  PLT 285 264   Cardiac Enzymes: No results for input(s): CKTOTAL, CKMB, CKMBINDEX, TROPONINI in the last 168 hours. BNP (last 3 results) No results for input(s): PROBNP in the last 8760 hours. CBG:  Recent Labs Lab 2014/10/07 1557 10-07-2014 1916 2014/10/07 2159 09/17/14 0802 09/17/14 1142  GLUCAP 278* 158* 288*  335* 283*    No results found for this or any previous visit (from the past 240 hour(s)).    Additional labs: 1. ESR 38 2. A1C; 10     Studies: Dg Foot Complete Right  10-07-2014   3 views of a skeletally mature individual were obtained of the right foot.  Study includes DP, oblique, lateral projections.  There is mild ostial lysis of the fifth metatarsal head consistent with  possible early osteomyelitis. No soft tissue emphysema. Calcification seen  at the anterior aspect of the leg in the soft tissues.       Scheduled Meds: . amLODipine  10 mg Oral Daily  . atorvastatin  20 mg Oral q1800  . benazepril  40 mg Oral Daily  . cefTRIAXone (ROCEPHIN)  IV  2 g Intravenous Q24H   And  . metronidazole  500 mg Intravenous Q8H  . dapagliflozin propanediol  10 mg Oral Daily  . enoxaparin (LOVENOX) injection  40 mg Subcutaneous Q24H  . glipiZIDE  10 mg Oral BID WC  . insulin aspart  0-15 Units Subcutaneous TID WC  . insulin aspart  0-5 Units Subcutaneous QHS  . insulin aspart  3 Units Subcutaneous TID WC  . insulin glargine  20 Units Subcutaneous Daily  . linagliptin  5 mg Oral Daily  . metFORMIN  1,000 mg Oral BID WC   Continuous Infusions: . sodium chloride 75 mL/hr at 09/17/14 0242    Active Problems:   Cellulitis    Time spent: 50 minutes.    Vernell Leep, MD, FACP, FHM. [facility] Pager [phone]  If 7PM-7AM, please contact night-coverage [email or web address] Password [email or web address] 09/17/2014, 12:11 PM    LOS: 1 day

## 2014-09-18 ENCOUNTER — Other Ambulatory Visit (HOSPITAL_COMMUNITY): Payer: Self-pay | Admitting: Orthopedic Surgery

## 2014-09-18 DIAGNOSIS — L97509 Non-pressure chronic ulcer of other part of unspecified foot with unspecified severity: Secondary | ICD-10-CM

## 2014-09-18 DIAGNOSIS — E1342 Other specified diabetes mellitus with diabetic polyneuropathy: Secondary | ICD-10-CM

## 2014-09-18 DIAGNOSIS — L03115 Cellulitis of right lower limb: Secondary | ICD-10-CM

## 2014-09-18 DIAGNOSIS — IMO0001 Reserved for inherently not codable concepts without codable children: Secondary | ICD-10-CM | POA: Diagnosis present

## 2014-09-18 DIAGNOSIS — E13621 Other specified diabetes mellitus with foot ulcer: Secondary | ICD-10-CM

## 2014-09-18 DIAGNOSIS — E11621 Type 2 diabetes mellitus with foot ulcer: Secondary | ICD-10-CM | POA: Diagnosis present

## 2014-09-18 DIAGNOSIS — E1142 Type 2 diabetes mellitus with diabetic polyneuropathy: Secondary | ICD-10-CM

## 2014-09-18 DIAGNOSIS — G629 Polyneuropathy, unspecified: Secondary | ICD-10-CM

## 2014-09-18 DIAGNOSIS — Z794 Long term (current) use of insulin: Secondary | ICD-10-CM

## 2014-09-18 DIAGNOSIS — E1065 Type 1 diabetes mellitus with hyperglycemia: Secondary | ICD-10-CM

## 2014-09-18 DIAGNOSIS — L97519 Non-pressure chronic ulcer of other part of right foot with unspecified severity: Secondary | ICD-10-CM

## 2014-09-18 DIAGNOSIS — E1165 Type 2 diabetes mellitus with hyperglycemia: Secondary | ICD-10-CM | POA: Diagnosis present

## 2014-09-18 LAB — CBC
HCT: 36.8 % — ABNORMAL LOW (ref 39.0–52.0)
HEMOGLOBIN: 12.2 g/dL — AB (ref 13.0–17.0)
MCH: 26 pg (ref 26.0–34.0)
MCHC: 33.2 g/dL (ref 30.0–36.0)
MCV: 78.5 fL (ref 78.0–100.0)
Platelets: 272 10*3/uL (ref 150–400)
RBC: 4.69 MIL/uL (ref 4.22–5.81)
RDW: 13.5 % (ref 11.5–15.5)
WBC: 10.7 10*3/uL — ABNORMAL HIGH (ref 4.0–10.5)

## 2014-09-18 LAB — GLUCOSE, CAPILLARY
GLUCOSE-CAPILLARY: 225 mg/dL — AB (ref 70–99)
GLUCOSE-CAPILLARY: 202 mg/dL — AB (ref 70–99)

## 2014-09-18 LAB — C-REACTIVE PROTEIN: CRP: 2.7 mg/dL — AB (ref <0.60)

## 2014-09-18 LAB — HIV ANTIBODY (ROUTINE TESTING W REFLEX): HIV 1&2 Ab, 4th Generation: NONREACTIVE

## 2014-09-18 MED ORDER — LIVING WELL WITH DIABETES BOOK
Freq: Once | Status: AC
  Administered 2014-09-18: 15:00:00
  Filled 2014-09-18: qty 1

## 2014-09-18 MED ORDER — AMLODIPINE BESY-BENAZEPRIL HCL 10-40 MG PO CAPS: 1.0000 | ORAL_CAPSULE | Freq: Every day | ORAL | Status: DC

## 2014-09-18 MED ORDER — FREESTYLE SYSTEM KIT: 1.0000 | PACK | Status: DC | PRN

## 2014-09-18 MED ORDER — INSULIN STARTER KIT- PEN NEEDLES (ENGLISH)
1.0000 | Freq: Once | Status: AC
  Administered 2014-09-18: 1
  Filled 2014-09-18: qty 1

## 2014-09-18 MED ORDER — ATORVASTATIN CALCIUM 20 MG PO TABS
20.0000 mg | ORAL_TABLET | Freq: Every day | ORAL | Status: DC
Start: 2014-09-18 — End: 2022-03-09

## 2014-09-18 MED ORDER — CEPHALEXIN 500 MG PO CAPS: 500.0000 mg | ORAL_CAPSULE | Freq: Three times a day (TID) | ORAL | Status: DC

## 2014-09-18 MED ORDER — DOXYCYCLINE HYCLATE 100 MG PO TABS: 100.0000 mg | ORAL_TABLET | Freq: Two times a day (BID) | ORAL | Status: DC

## 2014-09-18 MED ORDER — INSULIN GLARGINE 100 UNIT/ML SOLOSTAR PEN: 60.0000 [IU] | PEN_INJECTOR | Freq: Every day | SUBCUTANEOUS | Status: DC

## 2014-09-18 MED ORDER — METFORMIN HCL 1000 MG PO TABS: 1000.0000 mg | ORAL_TABLET | Freq: Two times a day (BID) | ORAL | Status: DC

## 2014-09-18 MED ORDER — INSULIN PEN NEEDLE 30G X 5 MM MISC: Status: DC

## 2014-09-18 NOTE — Progress Notes (Signed)
Discharge instructions and Rx's reviewed with the patient and significant other at the bedside. Pt asked appropriate questions. Pt ready for discharge.

## 2014-09-18 NOTE — Progress Notes (Signed)
   Subjective:     Right foot ulcer Dr. Ranell Patrick spoke with Dr. Lajoyce Corners about managing care  MRI shows small ulcer extending down to towards the 5th metatarsal with only slight edema suggesting possible early osteomyelitis Pt doing well today Denies any new symptoms or issues   Patient reports pain as mild.  Objective:   VITALS:   Filed Vitals:   09/18/14 0526  BP: 152/87  Pulse: 87  Temp: 98.2 F (36.8 C)  Resp: 18    Right foot with small ulcer at 5th metatarsal head nv intact distally   LABS  Recent Labs  09/16/14 1620 09/17/14 0447 09/18/14 0538  HGB 13.4 12.4* 12.2*  HCT 40.1 37.2* 36.8*  WBC 14.7* 10.3 10.7*  PLT 285 264 272     Recent Labs  09/16/14 1620 09/17/14 0447  NA 137 137  K 4.5 4.0  BUN 11 10  CREATININE 0.86 0.78  GLUCOSE 285* 323*     Assessment/Plan:    right foot ulcer with possible early osteomyelitis Recommend continue IV antibiotics  Dr. Lajoyce Corners to evaluate for further disposition Continue wound care to right foot   Alphonsa Overall, MPAS, PA-C  09/18/2014, 11:15 AM

## 2014-09-18 NOTE — Discharge Summary (Signed)
Physician Discharge Summary  Jeffery Villa ZOX:[id number] DOB: 1986/06/07 DOA: 09/16/2014  PCP: No PCP Per Patient  Admit date: 09/16/2014 Discharge date: 09/18/2014  Time spent: 40 minutes  Recommendations for Outpatient Follow-up:  1. Follow-up with primary care physician within one week.  Discharge Diagnoses:  Principal Problem:   Diabetic foot ulcer Active Problems:   Cellulitis   Diabetes mellitus, insulin dependent (IDDM), uncontrolled   Diabetic peripheral neuropathy   Discharge Condition: Stable  Diet recommendation: Carbohydrate modified diet  Filed Weights   09/16/14 1945  Weight: 103 kg (227 lb 1.2 oz)    History of present illness:  Pt is 28 yo male with HTN, DM type II, presented to [facility] ED from doctor's office for evaluation of diabetic foot ulcer. Pt explains he has noted the small blister like wound on the side of the right foot for several weeks and has gotten progressively worse, associated with malodorous discharge, throbbing pain present only with ambulation and applying pressure to the area, also progressive erythema around the ulcer, warmth to touch noted. Pt also reports subjective fevers, chills but has not checked the temperature. He explains he ran out of the diabetic medication several days ago.  In ED, pt is hemodynamically stable, VSS, report from doctor's office indicated XRAY of the right foot with osteophyte lysis of the fifth metatarsal head c/w osteomyelitis. TRH asked to admit for further evaluation.   Hospital Course:   Diabetic foot ulcer with osteomyelitis Patient has right lower extremity nonhealing ulcer for about 1-2 month. Patient has diabetic insensate neuropathy. Seen by Dr. Lajoyce Corners recommended right fifth ray amputation. Patient wants to go home because of the Thanksgiving holiday, probably for surgery next week. Patient discharged on Keflex and doxycycline to follow-up with Lajoyce Corners next Wednesday probably right fifth ray  amputation.  Sepsis Present at the time of admission, temp was 99.9, WBCs was 14.7, HR was 120 and evidence of osteomyelitis on x-ray. Sepsis physiology resolved after antibiotics started and patient aggressively hydrated with IV fluid.  Diabetes mellitus Atypical presentation, patient said was diagnosed at age 78 year old and he was on insulin. Patient reported for the past 2 years he's been on oral hypoglycemic. Likely MODY, anyway patient will need insulin, he was on 60 units of Lantus before restarted.  Hyperlipidemia Understanding continue, likely will improve after insulin started.  Hypertension On Lotrel (amlodipine and benazepril) continue regimen, stable blood pressure.  Procedures:  None  Consultations:  Dr. Lajoyce Corners  Discharge Exam: Filed Vitals:   09/18/14 1341  BP: 145/105  Pulse: 92  Temp: 98.4 F (36.9 C)  Resp: 18   General: Alert and awake, oriented x3, not in any acute distress. HEENT: anicteric sclera, pupils reactive to light and accommodation, EOMI CVS: S1-S2 clear, no murmur rubs or gallops Chest: clear to auscultation bilaterally, no wheezing, rales or rhonchi Abdomen: soft nontender, nondistended, normal bowel sounds, no organomegaly Extremities: no cyanosis, clubbing or edema noted bilaterally Neuro: Cranial nerves II-XII intact, no focal neurological deficits  Discharge Instructions You were cared for by a hospitalist during your hospital stay. If you have any questions about your discharge medications or the care you received while you were in the hospital after you are discharged, you can call the unit and asked to speak with the hospitalist on call if the hospitalist that took care of you is not available. Once you are discharged, your primary care physician will handle any further medical issues. Please note that NO REFILLS for any discharge medications will be  authorized once you are discharged, as it is imperative that you return to your primary  care physician (or establish a relationship with a primary care physician if you do not have one) for your aftercare needs so that they can reassess your need for medications and monitor your lab values.  Discharge Instructions    Ambulatory referral to Nutrition and Diabetic Education    Complete by:  As directed   Will need 1:1.  Diabetes since age 7.     Diet Carb Modified    Complete by:  As directed      Increase activity slowly    Complete by:  As directed           Current Discharge Medication List    START taking these medications   Details  cephALEXin (KEFLEX) 500 MG capsule Take 1 capsule (500 mg total) by mouth 3 (three) times daily. Qty: 21 capsule, Refills: 0    doxycycline (VIBRA-TABS) 100 MG tablet Take 1 tablet (100 mg total) by mouth 2 (two) times daily. Qty: 14 tablet, Refills: 0    Insulin Glargine (LANTUS SOLOSTAR) 100 UNIT/ML Solostar Pen Inject 60 Units into the skin daily at 10 pm. Qty: 15 mL, Refills: 0    metFORMIN (GLUCOPHAGE) 1000 MG tablet Take 1 tablet (1,000 mg total) by mouth 2 (two) times daily with a meal. Qty: 60 tablet, Refills: 0      CONTINUE these medications which have CHANGED   Details  amLODipine-benazepril (LOTREL) 10-40 MG per capsule Take 1 capsule by mouth daily. Qty: 30 capsule, Refills: 0    atorvastatin (LIPITOR) 20 MG tablet Take 1 tablet (20 mg total) by mouth daily. Qty: 30 tablet, Refills: 0      CONTINUE these medications which have NOT CHANGED   Details  acetaminophen (TYLENOL) 325 MG tablet Take 650 mg by mouth every 6 (six) hours as needed.      STOP taking these medications     glipiZIDE (GLUCOTROL) 10 MG tablet      dapagliflozin propanediol (FARXIGA) 10 MG TABS tablet      sitaGLIPtin-metformin (JANUMET) 50-1000 MG per tablet        No Known Allergies Follow-up Information    Follow up with DUDA,MARCUS V, MD In 1 week.   Specialty:  Orthopedic Surgery   Contact information:   [address] [place] [address] [phone]        The results of significant diagnostics from this hospitalization (including imaging, microbiology, ancillary and laboratory) are listed below for reference.    Significant Diagnostic Studies: Mr Foot Right Wo Contrast  09/18/2014   CLINICAL DATA:  Pt is 28 yo male with HTN, DM type II, presented to [facility] ED from doctor's office for evaluation of diabetic foot ulcer. Pt explains he has noted the small blister like wound on the side of the right foot for several weeks and has gotten progressively worse, associated with malodorous discharge, throbbing pain present only with ambulation and applying pressure to the area, also progressive erythema around the ulcer, warmth to touch noted. Pt also reports subjective fevers, chills but has not checked the temperature. He explains he ran out of the diabetic medication several days ago. report from doctor's office indicated XRAY of the right foot with osteophyte lysis of the fifth metatarsal head c/w osteomyelitis.  EXAM: MRI OF THE RIGHT FOREFOOT WITHOUT CONTRAST  TECHNIQUE: Multiplanar, multisequence MR imaging was performed. No intravenous contrast was administered.  COMPARISON:  None.  FINDINGS: There is generalized soft tissue edema along the dorsal aspect of the midfoot and forefoot. There is a soft tissue ulcer along the plantar lateral aspect of the right forefoot at the level of the fifth MTP joint. The soft tissue abnormality extends to very cortex of the fifth metatarsal head. There is abnormal T1 and T2 signal within the fifth metatarsal head and the base of the fifth proximal phalanx with a small joint effusion.  There is mild osteoarthritis of the first MTP joint. There is no other marrow signal abnormality. There is no fracture or dislocation. There is no focal fluid collection to suggest an abscess. There is no soft tissue mass or hematoma. The visualized portions of the flexor and extensor compartment  tendons are intact.  IMPRESSION: 1. Soft tissue ulcer along the plantar lateral aspect of the right forefoot at the level of the fifth MTP joint extending down to the cortex of the fifth metatarsal head. There is no definite cortical destruction, but there is abnormal T1 and T2 signal within the fifth metatarsal head and fifth proximal phalanx which may be reactive versus early osteomyelitis. No drainable abscess.   Electronically Signed   By: Elige KoHetal  Patel   On: 09/18/2014 07:43   Dg Foot Complete Right  09/16/2014   3 views of a skeletally mature individual were obtained of the right foot.  Study includes DP, oblique, lateral projections.  There is mild ostial lysis of the fifth metatarsal head consistent with  possible early osteomyelitis. No soft tissue emphysema. Calcification seen  at the anterior aspect of the leg in the soft tissues.   Microbiology: Recent Results (from the past 240 hour(s))  Blood Cultures x 2 sites     Status: None (Preliminary result)   Collection Time: 09/16/14  8:07 PM  Result Value Ref Range Status   Specimen Description BLOOD RIGHT HAND  Final   Special Requests BOTTLES DRAWN AEROBIC ONLY 5CC  Final   Culture  Setup Time   Final    09/17/2014 04:37 Performed at [facility]    Culture   Final           BLOOD CULTURE RECEIVED NO GROWTH TO DATE CULTURE WILL BE HELD FOR 5 DAYS BEFORE ISSUING A FINAL NEGATIVE REPORT Performed at [facility]    Report Status PENDING  Incomplete  Blood Cultures x 2 sites     Status: None (Preliminary result)   Collection Time: 09/16/14  8:16 PM  Result Value Ref Range Status   Specimen Description BLOOD LEFT HAND  Final   Special Requests BOTTLES DRAWN AEROBIC ONLY 5CC  Final   Culture  Setup Time   Final    09/17/2014 04:37 Performed at [facility]    Culture   Final           BLOOD CULTURE RECEIVED NO GROWTH TO DATE CULTURE WILL BE HELD FOR 5 DAYS BEFORE ISSUING A FINAL NEGATIVE REPORT Performed  at [facility]    Report Status PENDING  Incomplete     Labs: Basic Metabolic Panel:  Recent Labs Lab 09/16/14 1620 09/17/14 0447  NA 137 137  K 4.5 4.0  CL 98 99  CO2 27 24  GLUCOSE 285* 323*  BUN 11 10  CREATININE 0.86 0.78  CALCIUM 10.0 9.1   Liver Function Tests:  Recent Labs Lab 09/16/14 1620  AST 13  ALT 18  ALKPHOS 91  BILITOT 0.4  PROT 8.3  ALBUMIN 4.0  No results for input(s): LIPASE, AMYLASE in the last 168 hours. No results for input(s): AMMONIA in the last 168 hours. CBC:  Recent Labs Lab 09/16/14 1620 09/17/14 0447 09/18/14 0538  WBC 14.7* 10.3 10.7*  NEUTROABS 10.9*  --   --   HGB 13.4 12.4* 12.2*  HCT 40.1 37.2* 36.8*  MCV 80.5 80.5 78.5  PLT 285 264 272   Cardiac Enzymes: No results for input(s): CKTOTAL, CKMB, CKMBINDEX, TROPONINI in the last 168 hours. BNP: BNP (last 3 results) No results for input(s): PROBNP in the last 8760 hours. CBG:  Recent Labs Lab 09/17/14 1142 09/17/14 1708 09/17/14 2131 09/18/14 0739 09/18/14 1128  GLUCAP 283* 161* 168* 225* 202*       Signed:  Fran Neiswonger A  [facility] 09/18/2014, 2:49 PM

## 2014-09-18 NOTE — Progress Notes (Signed)
VASCULAR LAB PRELIMINARY  ARTERIAL  ABI completed:  ABIs and pedal waveforms normal.      RIGHT    LEFT    PRESSURE WAVEFORM  PRESSURE WAVEFORM  BRACHIAL 160 Triphasic  BRACHIAL 156 Triphasic   DP 196 Triphasic  DP 176 Triphasic   AT   AT    PT 205 Triphasic  PT 192 Triphasic   PER   PER    GREAT TOE 157 NA GREAT TOE 153 NA    RIGHT LEFT  ABI 0.98 0.96     Less [place], RVT 09/18/2014, 9:34 AM

## 2014-09-18 NOTE — Consult Note (Signed)
Reason for Consult: Ulceration purulent drainage right foot fifth metatarsal head Referring Physician: Dr. Reatha Armour Jeffery Villa is an 27 y.o. male.  HPI: Patient is a 28 year old gentleman diabetic insensate neuropathy who presents with purulent drainage from an ulceration fifth metatarsal head right foot  Past Medical History  Diagnosis Date  . Hypertension   . Diabetes mellitus without complication   . High cholesterol     History reviewed. No pertinent past surgical history.  History reviewed. No pertinent family history.  Social History:  reports that he has never smoked. He does not have any smokeless tobacco history on file. His alcohol and drug histories are not on file.  Allergies: No Known Allergies  Medications: I have reviewed the patient's current medications.  Results for orders placed or performed during the hospital encounter of 09/16/14 (from the past 48 hour(s))  CBG monitoring, ED     Status: Abnormal   Collection Time: 09/16/14  3:57 PM  Result Value Ref Range   Glucose-Capillary 278 (H) 70 - 99 mg/dL  CBC with Differential     Status: Abnormal   Collection Time: 09/16/14  4:20 PM  Result Value Ref Range   WBC 14.7 (H) 4.0 - 10.5 K/uL   RBC 4.98 4.22 - 5.81 MIL/uL   Hemoglobin 13.4 13.0 - 17.0 g/dL   HCT 40.1 39.0 - 52.0 %   MCV 80.5 78.0 - 100.0 fL   MCH 26.9 26.0 - 34.0 pg   MCHC 33.4 30.0 - 36.0 g/dL   RDW 13.4 11.5 - 15.5 %   Platelets 285 150 - 400 K/uL   Neutrophils Relative % 74 43 - 77 %   Neutro Abs 10.9 (H) 1.7 - 7.7 K/uL   Lymphocytes Relative 17 12 - 46 %   Lymphs Abs 2.5 0.7 - 4.0 K/uL   Monocytes Relative 8 3 - 12 %   Monocytes Absolute 1.2 (H) 0.1 - 1.0 K/uL   Eosinophils Relative 1 0 - 5 %   Eosinophils Absolute 0.1 0.0 - 0.7 K/uL   Basophils Relative 0 0 - 1 %   Basophils Absolute 0.0 0.0 - 0.1 K/uL  Comprehensive metabolic panel     Status: Abnormal   Collection Time: 09/16/14  4:20 PM  Result Value Ref Range   Sodium 137  137 - 147 mEq/L   Potassium 4.5 3.7 - 5.3 mEq/L   Chloride 98 96 - 112 mEq/L   CO2 27 19 - 32 mEq/L   Glucose, Bld 285 (H) 70 - 99 mg/dL   BUN 11 6 - 23 mg/dL   Creatinine, Ser 0.86 0.50 - 1.35 mg/dL   Calcium 10.0 8.4 - 10.5 mg/dL   Total Protein 8.3 6.0 - 8.3 g/dL   Albumin 4.0 3.5 - 5.2 g/dL   AST 13 0 - 37 U/L   ALT 18 0 - 53 U/L   Alkaline Phosphatase 91 39 - 117 U/L   Total Bilirubin 0.4 0.3 - 1.2 mg/dL   GFR calc non Af Amer >90 >90 mL/min   GFR calc Af Amer >90 >90 mL/min    Comment: (NOTE) The eGFR has been calculated using the CKD EPI equation. This calculation has not been validated in all clinical situations. eGFR's persistently <90 mL/min signify possible Chronic Kidney Disease.    Anion gap 12 5 - 15  Hemoglobin A1c     Status: Abnormal   Collection Time: 09/16/14  4:20 PM  Result Value Ref Range   Hgb A1c MFr Bld  10.0 (H) <5.7 %    Comment: (NOTE)                                                                       According to the ADA Clinical Practice Recommendations for 2011, when HbA1c is used as a screening test:  >=6.5%   Diagnostic of Diabetes Mellitus           (if abnormal result is confirmed) 5.7-6.4%   Increased risk of developing Diabetes Mellitus References:Diagnosis and Classification of Diabetes Mellitus,Diabetes LOVF,6433,29(JJOAC 1):S62-S69 and Standards of Medical Care in         Diabetes - 2011,Diabetes ZYSA,6301,60 (Suppl 1):S11-S61.    Mean Plasma Glucose 240 (H) <117 mg/dL    Comment: Performed at [facility]  CBG monitoring, ED     Status: Abnormal   Collection Time: 09/16/14  7:16 PM  Result Value Ref Range   Glucose-Capillary 158 (H) 70 - 99 mg/dL   Comment 1 Notify RN    Comment 2 Documented in Chart   Blood Cultures x 2 sites     Status: None (Preliminary result)   Collection Time: 09/16/14  8:07 PM  Result Value Ref Range   Specimen Description BLOOD RIGHT HAND    Special Requests BOTTLES DRAWN AEROBIC ONLY 5CC     Culture  Setup Time      09/17/2014 04:37 Performed at [place] NO GROWTH TO DATE CULTURE WILL BE HELD FOR 5 DAYS BEFORE ISSUING A FINAL NEGATIVE REPORT Performed at [facility]    Report Status PENDING   Blood Cultures x 2 sites     Status: None (Preliminary result)   Collection Time: 09/16/14  8:16 PM  Result Value Ref Range   Specimen Description BLOOD LEFT HAND    Special Requests BOTTLES DRAWN AEROBIC ONLY 5CC    Culture  Setup Time      09/17/2014 04:37 Performed at [place] TO DATE CULTURE WILL BE HELD FOR 5 DAYS BEFORE ISSUING A FINAL NEGATIVE REPORT Performed at [facility]    Report Status PENDING   Sedimentation rate     Status: Abnormal   Collection Time: 09/16/14  8:21 PM  Result Value Ref Range   Sed Rate 38 (H) 0 - 16 mm/hr  Glucose, capillary     Status: Abnormal   Collection Time: 09/16/14  9:59 PM  Result Value Ref Range   Glucose-Capillary 288 (H) 70 - 99 mg/dL   Comment 1 Notify RN   Basic metabolic panel     Status: Abnormal   Collection Time: 09/17/14  4:47 AM  Result Value Ref Range   Sodium 137 137 - 147 mEq/L   Potassium 4.0 3.7 - 5.3 mEq/L   Chloride 99 96 - 112 mEq/L   CO2 24 19 - 32 mEq/L   Glucose, Bld 323 (H) 70 - 99 mg/dL   BUN 10 6 - 23 mg/dL   Creatinine, Ser 0.78 0.50 - 1.35 mg/dL   Calcium  9.1 8.4 - 10.5 mg/dL   GFR calc non Af Amer >90 >90 mL/min   GFR calc Af Amer >90 >90 mL/min    Comment: (NOTE) The eGFR has been calculated using the CKD EPI equation. This calculation has not been validated in all clinical situations. eGFR's persistently <90 mL/min signify possible Chronic Kidney Disease.    Anion gap 14 5 - 15  CBC     Status: Abnormal   Collection Time: 09/17/14  4:47 AM  Result Value Ref Range   WBC 10.3 4.0 - 10.5 K/uL   RBC 4.62 4.22 - 5.81 MIL/uL   Hemoglobin 12.4 (L) 13.0 -  17.0 g/dL   HCT 37.2 (L) 39.0 - 52.0 %   MCV 80.5 78.0 - 100.0 fL   MCH 26.8 26.0 - 34.0 pg   MCHC 33.3 30.0 - 36.0 g/dL   RDW 13.6 11.5 - 15.5 %   Platelets 264 150 - 400 K/uL  Glucose, capillary     Status: Abnormal   Collection Time: 09/17/14  8:02 AM  Result Value Ref Range   Glucose-Capillary 335 (H) 70 - 99 mg/dL   Comment 1 Notify RN   Glucose, capillary     Status: Abnormal   Collection Time: 09/17/14 11:42 AM  Result Value Ref Range   Glucose-Capillary 283 (H) 70 - 99 mg/dL  Hemoglobin A1c     Status: Abnormal   Collection Time: 09/17/14  1:01 PM  Result Value Ref Range   Hgb A1c MFr Bld 10.4 (H) <5.7 %    Comment: (NOTE)                                                                       According to the ADA Clinical Practice Recommendations for 2011, when HbA1c is used as a screening test:  >=6.5%   Diagnostic of Diabetes Mellitus           (if abnormal result is confirmed) 5.7-6.4%   Increased risk of developing Diabetes Mellitus References:Diagnosis and Classification of Diabetes Mellitus,Diabetes UKGU,5427,06(CBJSE 1):S62-S69 and Standards of Medical Care in         Diabetes - 2011,Diabetes Care,2011,34 (Suppl 1):S11-S61.    Mean Plasma Glucose 252 (H) <117 mg/dL    Comment: Performed at [facility]  Glucose, capillary     Status: Abnormal   Collection Time: 09/17/14  5:08 PM  Result Value Ref Range   Glucose-Capillary 161 (H) 70 - 99 mg/dL  Glucose, capillary     Status: Abnormal   Collection Time: 09/17/14  9:31 PM  Result Value Ref Range   Glucose-Capillary 168 (H) 70 - 99 mg/dL   Comment 1 Notify RN   CBC     Status: Abnormal   Collection Time: 09/18/14  5:38 AM  Result Value Ref Range   WBC 10.7 (H) 4.0 - 10.5 K/uL   RBC 4.69 4.22 - 5.81 MIL/uL   Hemoglobin 12.2 (L) 13.0 - 17.0 g/dL   HCT 36.8 (L) 39.0 - 52.0 %   MCV 78.5 78.0 - 100.0 fL   MCH 26.0 26.0 - 34.0 pg   MCHC 33.2 30.0 - 36.0 g/dL   RDW 13.5 11.5 - 15.5 %   Platelets 272  150 - 400 K/uL  Glucose, capillary     Status: Abnormal   Collection Time: 09/18/14  7:39 AM  Result Value Ref Range   Glucose-Capillary 225 (H) 70 - 99 mg/dL    Mr Foot Right Wo Contrast  09/18/2014   CLINICAL DATA:  Pt is 28 yo male with HTN, DM type II, presented to [facility] ED from [place] office for evaluation of diabetic foot ulcer. Pt explains he has noted the small blister like wound on the side of the right foot for several weeks and has gotten progressively worse, associated with malodorous discharge, throbbing pain present only with ambulation and applying pressure to the area, also progressive erythema around the ulcer, warmth to touch noted. Pt also reports subjective fevers, chills but has not checked the temperature. He explains he ran out of the diabetic medication several days ago. report from doctor's office indicated XRAY of the right foot with osteophyte lysis of the fifth metatarsal head c/w osteomyelitis.  EXAM: MRI OF THE RIGHT FOREFOOT WITHOUT CONTRAST  TECHNIQUE: Multiplanar, multisequence MR imaging was performed. No intravenous contrast was administered.  COMPARISON:  None.  FINDINGS: There is generalized soft tissue edema along the dorsal aspect of the midfoot and forefoot. There is a soft tissue ulcer along the plantar lateral aspect of the right forefoot at the level of the fifth MTP joint. The soft tissue abnormality extends to very cortex of the fifth metatarsal head. There is abnormal T1 and T2 signal within the fifth metatarsal head and the base of the fifth proximal phalanx with a small joint effusion.  There is mild osteoarthritis of the first MTP joint. There is no other marrow signal abnormality. There is no fracture or dislocation. There is no focal fluid collection to suggest an abscess. There is no soft tissue mass or hematoma. The visualized portions of the flexor and extensor compartment tendons are intact.  IMPRESSION: 1. Soft tissue ulcer along the plantar lateral  aspect of the right forefoot at the level of the fifth MTP joint extending down to the cortex of the fifth metatarsal head. There is no definite cortical destruction, but there is abnormal T1 and T2 signal within the fifth metatarsal head and fifth proximal phalanx which may be reactive versus early osteomyelitis. No drainable abscess.   Electronically Signed   By: Kathreen Devoid   On: 09/18/2014 07:43   Dg Foot Complete Right  09/16/2014   3 views of a skeletally mature individual were obtained of the right foot.  Study includes DP, oblique, lateral projections.  There is mild ostial lysis of the fifth metatarsal head consistent with  possible early osteomyelitis. No soft tissue emphysema. Calcification seen  at the anterior aspect of the leg in the soft tissues.   Review of Systems  All other systems reviewed and are negative.  Blood pressure 152/87, pulse 87, temperature 98.2 F (36.8 C), temperature source Oral, resp. rate 18, height '6\' 2"'  (1.88 m), weight 103 kg (227 lb 1.2 oz), SpO2 96 %. Physical Exam On examination patient has palpable pulses with good ankle-brachial indices with triphasic flow ankle-brachial indices greater than 80%. Patient has some mild cellulitis over the dorsum of his foot and has a purulent draining ulcer over the fifth metatarsal head right foot. The ulcer is about 3 mm in diameter and probes down to the capsule of the bone. Radiographs show no destructive changes in the MRI scan does show some increased uptake in the bone consistent with possibly early bony infection. Hemoglobin A1c is 10  with poor glucose control. Assessment/Plan: Assessment: Diabetic insensate neuropathy with poor glucose control with ulceration purulent drainage cellulitis and early osteomyelitis of the fifth metatarsal head right foot.  Plan: Patient states he would like to go home at this time. I discussed that my recommendations are to proceed with a fifth ray amputation. Discussed that we could  suppress this infection but most likely will return.   I feel that it  would be safe to discharge the patient today on oral antibiotics and I will call to set the patient up for surgery for next week. Patient states that he agrees to this plan to proceed with a fifth ray amputation next week.   Discussed proper nutrition and discussed that without proper nutrition patient will have recurrent problems with ulcerations and infections in his feet. Patient states he understands I will follow-up with him next week.  Jeffery Villa 09/18/2014, 12:04 PM

## 2014-09-18 NOTE — Progress Notes (Addendum)
Followed up with patient regarding Diabetes today.  He understands that he will need insulin at discharge.  Re-demonstrated use of insulin pen to patient (as he has used them in the past).  He demonstrated and verbalized use of insulin pen.  He met with [facility] liason yesterday.  Note that at discharge he will need Rx. For insulin pens, insulin pen needles, and glucose meter (30047).  He should likely continue the metformin at discharge for insulin resistance.Will be glad to assist as needed.  Thanks, Adah Perl, RN, BC-ADM Inpatient Diabetes Coordinator Pager [phone]   Gave patient my card.  He has several questions about diet.  Referral has been placed for Nutrition and Diabetes Management program.  Answered questions.  Patient verbalized understanding.

## 2014-09-23 ENCOUNTER — Encounter (HOSPITAL_COMMUNITY): Payer: Self-pay | Admitting: *Deleted

## 2014-09-23 LAB — CULTURE, BLOOD (ROUTINE X 2)
Culture: NO GROWTH
Culture: NO GROWTH

## 2014-09-24 ENCOUNTER — Encounter (HOSPITAL_COMMUNITY): Payer: Self-pay | Admitting: Pharmacy Technician

## 2014-09-24 LAB — HEMOGLOBIN A1C
HEMOGLOBIN A1C: 10.3 % — AB (ref <5.7)
Mean Plasma Glucose: 249 mg/dL — ABNORMAL HIGH (ref <117)

## 2014-09-24 MED ORDER — CEFAZOLIN SODIUM-DEXTROSE 2-3 GM-% IV SOLR
2.0000 g | INTRAVENOUS | Status: AC
  Administered 2014-09-25: 3 g via INTRAVENOUS
  Filled 2014-09-24: qty 50

## 2014-09-25 ENCOUNTER — Encounter (HOSPITAL_COMMUNITY): Payer: Self-pay | Admitting: *Deleted

## 2014-09-25 ENCOUNTER — Ambulatory Visit (HOSPITAL_COMMUNITY): Payer: 59 | Admitting: Anesthesiology

## 2014-09-25 ENCOUNTER — Ambulatory Visit: Payer: 59 | Admitting: Podiatry

## 2014-09-25 ENCOUNTER — Ambulatory Visit (HOSPITAL_COMMUNITY)
Admission: RE | Admit: 2014-09-25 | Discharge: 2014-09-25 | Disposition: A | Discharge status: Home / Self Care | Payer: 59 | Source: Ambulatory Visit | Attending: Orthopedic Surgery | Admitting: Orthopedic Surgery

## 2014-09-25 ENCOUNTER — Encounter (HOSPITAL_COMMUNITY)
Admission: RE | Disposition: A | Discharge status: Home / Self Care | Payer: Self-pay | Source: Ambulatory Visit | Attending: Orthopedic Surgery

## 2014-09-25 DIAGNOSIS — M869 Osteomyelitis, unspecified: Secondary | ICD-10-CM

## 2014-09-25 DIAGNOSIS — E1140 Type 2 diabetes mellitus with diabetic neuropathy, unspecified: Secondary | ICD-10-CM | POA: Insufficient documentation

## 2014-09-25 DIAGNOSIS — E780 Pure hypercholesterolemia: Secondary | ICD-10-CM | POA: Diagnosis not present

## 2014-09-25 DIAGNOSIS — L03031 Cellulitis of right toe: Secondary | ICD-10-CM | POA: Diagnosis present

## 2014-09-25 DIAGNOSIS — L97519 Non-pressure chronic ulcer of other part of right foot with unspecified severity: Secondary | ICD-10-CM | POA: Diagnosis not present

## 2014-09-25 DIAGNOSIS — M868X7 Other osteomyelitis, ankle and foot: Secondary | ICD-10-CM | POA: Insufficient documentation

## 2014-09-25 DIAGNOSIS — I10 Essential (primary) hypertension: Secondary | ICD-10-CM | POA: Insufficient documentation

## 2014-09-25 HISTORY — PX: AMPUTATION: SHX166

## 2014-09-25 LAB — BASIC METABOLIC PANEL
Anion gap: 13 (ref 5–15)
BUN: 12 mg/dL (ref 6–23)
CALCIUM: 9.6 mg/dL (ref 8.4–10.5)
CO2: 25 meq/L (ref 19–32)
CREATININE: 0.77 mg/dL (ref 0.50–1.35)
Chloride: 100 mEq/L (ref 96–112)
GFR calc Af Amer: >90 mL/min (ref >90)
GFR calc non Af Amer: >90 mL/min (ref >90)
Glucose, Bld: 122 mg/dL — ABNORMAL HIGH (ref 70–99)
Potassium: 4.2 mEq/L (ref 3.7–5.3)
Sodium: 138 mEq/L (ref 137–147)

## 2014-09-25 LAB — CBC
HCT: 38.3 % — ABNORMAL LOW (ref 39.0–52.0)
Hemoglobin: 13 g/dL (ref 13.0–17.0)
MCH: 26.5 pg (ref 26.0–34.0)
MCHC: 33.9 g/dL (ref 30.0–36.0)
MCV: 78 fL (ref 78.0–100.0)
Platelets: 434 10*3/uL — ABNORMAL HIGH (ref 150–400)
RBC: 4.91 MIL/uL (ref 4.22–5.81)
RDW: 13.2 % (ref 11.5–15.5)
WBC: 11.9 10*3/uL — ABNORMAL HIGH (ref 4.0–10.5)

## 2014-09-25 LAB — GLUCOSE, CAPILLARY
Glucose-Capillary: 120 mg/dL — ABNORMAL HIGH (ref 70–99)
Glucose-Capillary: 115 mg/dL — ABNORMAL HIGH (ref 70–99)

## 2014-09-25 SURGERY — AMPUTATION, FOOT, RAY
Anesthesia: General | Site: Toe | Laterality: Right

## 2014-09-25 MED ORDER — LIDOCAINE HCL 4 % MT SOLN
OROMUCOSAL | Status: DC | PRN
  Administered 2014-09-25: 4 mL via TOPICAL

## 2014-09-25 MED ORDER — ROCURONIUM BROMIDE 100 MG/10ML IV SOLN
INTRAVENOUS | Status: DC | PRN
  Administered 2014-09-25: 5 mg via INTRAVENOUS

## 2014-09-25 MED ORDER — FENTANYL CITRATE 0.05 MG/ML IJ SOLN
INTRAMUSCULAR | Status: DC | PRN
  Administered 2014-09-25: 25 ug via INTRAVENOUS
  Administered 2014-09-25: 100 ug via INTRAVENOUS
  Administered 2014-09-25: 50 ug via INTRAVENOUS

## 2014-09-25 MED ORDER — PROPOFOL 10 MG/ML IV BOLUS
INTRAVENOUS | Status: AC
  Filled 2014-09-25: qty 20

## 2014-09-25 MED ORDER — ONDANSETRON HCL 4 MG/2ML IJ SOLN
INTRAMUSCULAR | Status: AC
  Filled 2014-09-25: qty 2

## 2014-09-25 MED ORDER — SUCCINYLCHOLINE CHLORIDE 20 MG/ML IJ SOLN
INTRAMUSCULAR | Status: AC
  Filled 2014-09-25: qty 1

## 2014-09-25 MED ORDER — MIDAZOLAM HCL 5 MG/5ML IJ SOLN
INTRAMUSCULAR | Status: DC | PRN
  Administered 2014-09-25: 2 mg via INTRAVENOUS

## 2014-09-25 MED ORDER — MIDAZOLAM HCL 2 MG/2ML IJ SOLN
INTRAMUSCULAR | Status: AC
  Filled 2014-09-25: qty 2

## 2014-09-25 MED ORDER — FENTANYL CITRATE 0.05 MG/ML IJ SOLN
INTRAMUSCULAR | Status: AC
  Filled 2014-09-25: qty 5

## 2014-09-25 MED ORDER — LIDOCAINE HCL (CARDIAC) 20 MG/ML IV SOLN
INTRAVENOUS | Status: AC
  Filled 2014-09-25: qty 5

## 2014-09-25 MED ORDER — NEOSTIGMINE METHYLSULFATE 10 MG/10ML IV SOLN
INTRAVENOUS | Status: AC
  Filled 2014-09-25: qty 1

## 2014-09-25 MED ORDER — GLYCOPYRROLATE 0.2 MG/ML IJ SOLN
INTRAMUSCULAR | Status: AC
  Filled 2014-09-25: qty 3

## 2014-09-25 MED ORDER — PROPOFOL 10 MG/ML IV BOLUS
INTRAVENOUS | Status: DC | PRN
  Administered 2014-09-25: 200 mg via INTRAVENOUS
  Administered 2014-09-25: 100 mg via INTRAVENOUS

## 2014-09-25 MED ORDER — LACTATED RINGERS IV SOLN
INTRAVENOUS | Status: DC | PRN
  Administered 2014-09-25: 11:00:00 via INTRAVENOUS

## 2014-09-25 MED ORDER — ONDANSETRON HCL 4 MG/2ML IJ SOLN
INTRAMUSCULAR | Status: DC | PRN
  Administered 2014-09-25: 4 mg via INTRAVENOUS

## 2014-09-25 MED ORDER — HYDROCODONE-ACETAMINOPHEN 5-325 MG PO TABS: 1.0000 | ORAL_TABLET | Freq: Four times a day (QID) | ORAL | Status: DC | PRN

## 2014-09-25 MED ORDER — LIDOCAINE HCL (CARDIAC) 20 MG/ML IV SOLN
INTRAVENOUS | Status: DC | PRN
  Administered 2014-09-25: 100 mg via INTRAVENOUS

## 2014-09-25 MED ORDER — 0.9 % SODIUM CHLORIDE (POUR BTL) OPTIME
TOPICAL | Status: DC | PRN
  Administered 2014-09-25: 1000 mL

## 2014-09-25 MED ORDER — ARTIFICIAL TEARS OP OINT
TOPICAL_OINTMENT | OPHTHALMIC | Status: DC | PRN
  Administered 2014-09-25: 1 via OPHTHALMIC

## 2014-09-25 MED ORDER — LACTATED RINGERS IV SOLN
INTRAVENOUS | Status: DC
  Administered 2014-09-25: 10:00:00 via INTRAVENOUS

## 2014-09-25 MED ORDER — ROCURONIUM BROMIDE 50 MG/5ML IV SOLN
INTRAVENOUS | Status: AC
  Filled 2014-09-25: qty 1

## 2014-09-25 MED ORDER — SUCCINYLCHOLINE CHLORIDE 20 MG/ML IJ SOLN
INTRAMUSCULAR | Status: DC | PRN
  Administered 2014-09-25: 110 mg via INTRAVENOUS

## 2014-09-25 SURGICAL SUPPLY — 34 items
BLADE SAW SGTL MED 73X18.5 STR (BLADE) IMPLANT
BNDG COHESIVE 4X5 TAN STRL (GAUZE/BANDAGES/DRESSINGS) ×2 IMPLANT
BNDG GAUZE ELAST 4 BULKY (GAUZE/BANDAGES/DRESSINGS) ×2 IMPLANT
COVER SURGICAL LIGHT HANDLE (MISCELLANEOUS) ×2 IMPLANT
DRAPE U-SHAPE 47X51 STRL (DRAPES) ×4 IMPLANT
DRSG ADAPTIC 3X8 NADH LF (GAUZE/BANDAGES/DRESSINGS) ×2 IMPLANT
DRSG PAD ABDOMINAL 8X10 ST (GAUZE/BANDAGES/DRESSINGS) ×2 IMPLANT
DURAPREP 26ML APPLICATOR (WOUND CARE) ×2 IMPLANT
ELECT REM PT RETURN 9FT ADLT (ELECTROSURGICAL) ×2
ELECTRODE REM PT RTRN 9FT ADLT (ELECTROSURGICAL) ×1 IMPLANT
GAUZE SPONGE 4X4 12PLY STRL (GAUZE/BANDAGES/DRESSINGS) ×2 IMPLANT
GLOVE BIOGEL PI IND STRL 6.5 (GLOVE) ×1 IMPLANT
GLOVE BIOGEL PI IND STRL 9 (GLOVE) ×1 IMPLANT
GLOVE BIOGEL PI INDICATOR 6.5 (GLOVE) ×1
GLOVE BIOGEL PI INDICATOR 9 (GLOVE) ×1
GLOVE SURG ORTHO 9.0 STRL STRW (GLOVE) ×2 IMPLANT
GLOVE SURG SS PI 6.5 STRL IVOR (GLOVE) ×2 IMPLANT
GOWN STRL REUS W/ TWL XL LVL3 (GOWN DISPOSABLE) ×2 IMPLANT
GOWN STRL REUS W/TWL XL LVL3 (GOWN DISPOSABLE) ×2
KIT BASIN OR (CUSTOM PROCEDURE TRAY) ×2 IMPLANT
KIT ROOM TURNOVER OR (KITS) ×2 IMPLANT
NS IRRIG 1000ML POUR BTL (IV SOLUTION) ×2 IMPLANT
PACK ORTHO EXTREMITY (CUSTOM PROCEDURE TRAY) ×2 IMPLANT
PAD ARMBOARD 7.5X6 YLW CONV (MISCELLANEOUS) ×4 IMPLANT
SPONGE GAUZE 4X4 12PLY STER LF (GAUZE/BANDAGES/DRESSINGS) ×2 IMPLANT
SPONGE LAP 18X18 X RAY DECT (DISPOSABLE) ×2 IMPLANT
STOCKINETTE IMPERVIOUS LG (DRAPES) IMPLANT
SUT ETHILON 2 0 PSLX (SUTURE) ×8 IMPLANT
TOWEL OR 17X24 6PK STRL BLUE (TOWEL DISPOSABLE) ×2 IMPLANT
TOWEL OR 17X26 10 PK STRL BLUE (TOWEL DISPOSABLE) ×2 IMPLANT
TUBE CONNECTING 20X1/4 (TUBING) ×2 IMPLANT
UNDERPAD 30X30 INCONTINENT (UNDERPADS AND DIAPERS) ×2 IMPLANT
WATER STERILE IRR 1000ML POUR (IV SOLUTION) ×2 IMPLANT
YANKAUER SUCT BULB TIP NO VENT (SUCTIONS) ×2 IMPLANT

## 2014-09-25 NOTE — Op Note (Signed)
09/25/2014  11:10 AM  PATIENT:  Jeffery Villa    PRE-OPERATIVE DIAGNOSIS:  Cellulitis, osteomyelitis right 5th MT head  POST-OPERATIVE DIAGNOSIS:  Same  PROCEDURE:  AMPUTATION RIGHT 5th RAY Local tissue rearrangement to close wound.  SURGEON:  Nadara Mustard, MD  PHYSICIAN ASSISTANT:None ANESTHESIA:   General  PREOPERATIVE INDICATIONS:  Jeffery Villa is a  28 y.o. male with a diagnosis of Cellulitis, osteomyelitis right 5th MT head who failed conservative measures and elected for surgical management.    The risks benefits and alternatives were discussed with the patient preoperatively including but not limited to the risks of infection, bleeding, nerve injury, cardiopulmonary complications, the need for revision surgery, among others, and the patient was willing to proceed.  OPERATIVE IMPLANTS: None  OPERATIVE FINDINGS: No deep abscess  OPERATIVE PROCEDURE: Patient was brought to the operating room and underwent a general anesthetic. After adequate levels of anesthesia were obtained patient's right lower extremity was prepped using DuraPrep draped into a sterile field. A timeout was called. A racquet incision was made around the metatarsal and ulcer. The toe ulcer and metatarsal were resected in one block of tissue. The wound was irrigated with normal saline. Hemostasis was obtained. The incision was closed using 2-0 nylon. Local tissue rearrangement was performed to close the wound wound was 7 x 3 cm.

## 2014-09-25 NOTE — Transfer of Care (Signed)
Immediate Anesthesia Transfer of Care Note  Patient: Jeffery Villa  Procedure(s) Performed: Procedure(s): AMPUTATION RIGHT 5th RAY (Right)  Patient Location: PACU  Anesthesia Type:General  Level of Consciousness: awake and alert   Airway & Oxygen Therapy: Patient Spontanous Breathing and Patient connected to nasal cannula oxygen  Post-op Assessment: Report given to PACU RN, Post -op Vital signs reviewed and stable and Patient moving all extremities  Post vital signs: Reviewed and stable  Complications: No apparent anesthesia complications

## 2014-09-25 NOTE — Anesthesia Procedure Notes (Addendum)
Anesthesia Regional Block:  Ankle block  Pre-Anesthetic Checklist: ,, timeout performed, Correct Patient, Correct Site, Correct Laterality, Correct Procedure, Correct Position,,,,, post-op pain management  Laterality: Right  Prep: chloraprep       Needles:  Injection technique: Single-shot  Needle Type: Other     Needle Length: 4cm 4 cm Needle Gauge: 25 and 25 G    Additional Needles: Ankle block Narrative:  Start time: 09/25/2014 9:51 AM End time: 09/25/2014 9:58 AM Injection made incrementally with aspirations every 5 mL.  Performed by: Personally  Anesthesiologist: Sebastian Ache  Additional Notes: R ankle block with 20cc .5% marcaine plain, sterile prep, no complications, no sedation given   Procedure Name: Intubation Date/Time: 09/25/2014 10:40 AM Performed by: Edmonia Caprio Pre-anesthesia Checklist: Patient identified, Emergency Drugs available, Patient being monitored, Suction available and Timeout performed Patient Re-evaluated:Patient Re-evaluated prior to inductionOxygen Delivery Method: Circle system utilized Preoxygenation: Pre-oxygenation with 100% oxygen Intubation Type: IV induction Ventilation: Mask ventilation without difficulty Laryngoscope Size: Miller and 1 Grade View: Grade I Tube type: Oral Number of attempts: 1 Placement Confirmation: ETT inserted through vocal cords under direct vision,  breath sounds checked- equal and bilateral and positive ETCO2 Secured at: 23 cm Tube secured with: Tape Dental Injury: Teeth and Oropharynx as per pre-operative assessment

## 2014-09-25 NOTE — Progress Notes (Signed)
Orthopedic Tech Progress Note Patient Details:  Jeffery Villa [birth year] [id number]  Ortho Devices Type of Ortho Device: Postop shoe/boot, Crutches Ortho Device/Splint Location: rle Ortho Device/Splint Interventions: Application   Jeffery Villa 09/25/2014, 11:55 AM

## 2014-09-25 NOTE — Anesthesia Postprocedure Evaluation (Signed)
  Anesthesia Post-op Note  Patient: Jeffery Villa  Procedure(s) Performed: Procedure(s): AMPUTATION RIGHT 5th RAY (Right)  Patient Location: PACU  Anesthesia Type:General  Level of Consciousness: alert  and oriented  Airway and Oxygen Therapy: Patient Spontanous Breathing and Patient connected to nasal cannula oxygen  Post-op Pain: none  Post-op Assessment: Post-op Vital signs reviewed, Patient's Cardiovascular Status Stable, Respiratory Function Stable, Patent Airway and No signs of Nausea or vomiting  Post-op Vital Signs: Reviewed and stable  Last Vitals:  Filed Vitals:   09/25/14 0934  BP: 164/95  Pulse: 98  Temp: 36.9 C  Resp: 18    Complications: No apparent anesthesia complications

## 2014-09-25 NOTE — Progress Notes (Signed)
Orthopedic Tech Progress Note Patient Details:  Jeffery Villa [birth year] [id number]  Patient ID: [name], male   DOB: 11/08/1985, 28 y.o.   MRN: [id number] Viewed order from doctor's order list  Nikki Dom 09/25/2014, 11:55 AM

## 2014-09-25 NOTE — Anesthesia Preprocedure Evaluation (Addendum)
Anesthesia Evaluation  Patient identified by MRN, date of birth, ID band Patient awake    Reviewed: Allergy & Precautions, H&P , NPO status , Patient's Chart, lab work & pertinent test results, reviewed documented beta blocker date and time   Airway Mallampati: II   Neck ROM: Full    Dental  (+) Teeth Intact, Dental Advisory Given   Pulmonary  breath sounds clear to auscultation        Cardiovascular hypertension,     Neuro/Psych    GI/Hepatic   Endo/Other  diabetes, Poorly Controlled, Type 2  Renal/GU      Musculoskeletal   Abdominal (+)  Abdomen: soft.    Peds  Hematology   Anesthesia Other Findings   Reproductive/Obstetrics                           Anesthesia Physical Anesthesia Plan  ASA: II  Anesthesia Plan: General   Post-op Pain Management:    Induction: Intravenous  Airway Management Planned: LMA and Oral ETT  Additional Equipment:   Intra-op Plan:   Post-operative Plan: Extubation in OR  Informed Consent: I have reviewed the patients History and Physical, chart, labs and discussed the procedure including the risks, benefits and alternatives for the proposed anesthesia with the patient or authorized representative who has indicated his/her understanding and acceptance.     Plan Discussed with:   Anesthesia Plan Comments:        Anesthesia Quick Evaluation

## 2014-09-25 NOTE — H&P (Signed)
Tyee Dzikowski is an 28 y.o. male.   Chief Complaint: Osteomyelitis ulceration right foot fifth metatarsal head HPI: Patient is a 28 year old gentleman with diabetic insensate neuropathy who is failed conservative wound care for the right foot.  Past Medical History  Diagnosis Date  . Hypertension   . Diabetes mellitus without complication   . High cholesterol     Past Surgical History  Procedure Laterality Date  . No past surgeries      History reviewed. No pertinent family history. Social History:  reports that he has never smoked. He does not have any smokeless tobacco history on file. He reports that he does not drink alcohol or use illicit drugs.  Allergies: No Known Allergies  No prescriptions prior to admission    No results found for this or any previous visit (from the past 48 hour(s)). No results found.  Review of Systems  All other systems reviewed and are negative.   There were no vitals taken for this visit. Physical Exam  On examination patient has palpable pulses. There is ulceration and osteomyelitis of the fifth metatarsal head right foot. Assessment/Plan Osteomyelitis and ulceration right foot fifth metatarsal head.  Plan: We will plan for right foot fifth ray amputation. Risks and benefits were discussed including risk of the wound not healing. Patient states he understands and wished to proceed at this time.  Marvella Jenning V 09/25/2014, 6:37 AM

## 2014-09-27 ENCOUNTER — Ambulatory Visit: Payer: 59 | Admitting: Skilled Nursing Facility1

## 2014-09-27 ENCOUNTER — Encounter (HOSPITAL_COMMUNITY): Payer: Self-pay | Admitting: Orthopedic Surgery

## 2014-11-08 ENCOUNTER — Ambulatory Visit: Payer: 59 | Admitting: Skilled Nursing Facility1

## 2015-02-13 ENCOUNTER — Ambulatory Visit (INDEPENDENT_AMBULATORY_CARE_PROVIDER_SITE_OTHER): Payer: Worker's Compensation | Admitting: Physician Assistant

## 2015-02-13 VITALS — BP 138/96 | HR 72 | Temp 97.6°F | Resp 18 | Ht 74.0 in | Wt 294.4 lb

## 2015-02-13 DIAGNOSIS — T7589XA Other specified effects of external causes, initial encounter: Secondary | ICD-10-CM

## 2015-02-13 NOTE — Progress Notes (Signed)
   Subjective:    Patient ID: Jeffery Villa, male    DOB: 1986-10-20, 29 y.o.   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 When source patient bit down on his left forearm he yanked arm out of her mouth. Washed arm with peroxide and covered with band-aid. Reports that area is sore, but not painful. Area of erythema no larger than yesterday. Denies fever or numbness. Has had both Hep B and TDAP vaccinations. NKDA.  BP elevated in office. Has been off BP medication for 4 months and just saw PCP yesterday. Picking up Lotrel today to restart.   Review of Systems  Constitutional: Negative for fever and chills.  Musculoskeletal: Negative for myalgias, joint swelling and arthralgias.  Skin: Positive for color change and wound (small). Negative for pallor and rash.       Objective:   Physical Exam  Constitutional: He appears well-developed and well-nourished. No distress.  Blood pressure 142/100, pulse 72, temperature 97.6 F (36.4 C), temperature source Oral, resp. rate 18, height 6\' 2"  (1.88 m), weight 294 lb 6.4 oz (133.539 kg), SpO2 97 %.  HENT:  Head: Normocephalic and atraumatic.  Right Ear: External ear normal.  Left Ear: External ear normal.  Eyes: Conjunctivae are normal. Right eye exhibits no discharge. Left eye exhibits no discharge.  Pulmonary/Chest: Effort normal.  Skin: Skin is warm and dry. No rash noted. He is not diaphoretic. There is erythema. No pallor.  5x4 cm area of erythema. Skin broken, but no puncture wounds appreciated. No warm or tender to palpation.       Assessment & Plan:  1. Exposure, initial encounter HIV and Hep C exposure risk minimal. Hep B vaccination UTD as required by job. Source patient coming in for testing. Best # for patient [phone]. - Hepatitis B surface antibody - Hepatitis C antibody   Janan Ridge PA-C  [facility] 02/13/2015 7:59 PM

## 2015-02-14 LAB — HEPATITIS B SURFACE ANTIBODY, QUANTITATIVE: Hepatitis B-Post: 29.3 m[IU]/mL

## 2015-02-14 LAB — HEPATITIS C ANTIBODY: HCV Ab: NEGATIVE

## 2015-02-17 NOTE — Addendum Note (Signed)
Addended by: Hinton Rao on: 02/17/2015 06:38 PM   Modules accepted: Level of Service

## 2015-02-17 NOTE — Progress Notes (Addendum)
Source patient MRN [id number] Hep B antigen and Hep C antibody labs negative. No additional follow up needed at this time.

## 2015-10-09 ENCOUNTER — Ambulatory Visit (INDEPENDENT_AMBULATORY_CARE_PROVIDER_SITE_OTHER): Payer: Commercial Managed Care - HMO | Admitting: Family Medicine

## 2015-10-09 VITALS — BP 130/82 | HR 89 | Temp 98.8°F | Resp 18 | Ht 74.0 in | Wt 310.0 lb

## 2015-10-09 DIAGNOSIS — Z0000 Encounter for general adult medical examination without abnormal findings: Secondary | ICD-10-CM | POA: Diagnosis not present

## 2015-10-09 DIAGNOSIS — E782 Mixed hyperlipidemia: Secondary | ICD-10-CM | POA: Diagnosis not present

## 2015-10-09 DIAGNOSIS — Z794 Long term (current) use of insulin: Secondary | ICD-10-CM

## 2015-10-09 DIAGNOSIS — I10 Essential (primary) hypertension: Secondary | ICD-10-CM

## 2015-10-09 DIAGNOSIS — Z2821 Immunization not carried out because of patient refusal: Secondary | ICD-10-CM

## 2015-10-09 DIAGNOSIS — E1165 Type 2 diabetes mellitus with hyperglycemia: Secondary | ICD-10-CM

## 2015-10-09 LAB — LIPID PANEL
Cholesterol: 206 mg/dL — ABNORMAL HIGH (ref 125–200)
HDL: 34 mg/dL — ABNORMAL LOW (ref >=40)
LDL Cholesterol: 119 mg/dL (ref <130)
Total CHOL/HDL Ratio: 6.1 ratio — ABNORMAL HIGH (ref <=5.0)
Triglycerides: 263 mg/dL — ABNORMAL HIGH (ref <150)
VLDL: 53 mg/dL — ABNORMAL HIGH (ref <30)

## 2015-10-09 LAB — COMPLETE METABOLIC PANEL WITH GFR
ALT: 28 U/L (ref 9–46)
Albumin: 4.3 g/dL (ref 3.6–5.1)
CO2: 25 mmol/L (ref 20–31)
Chloride: 98 mmol/L (ref 98–110)
GFR, Est Non African American: >89 mL/min (ref >=60)
Glucose, Bld: 313 mg/dL — ABNORMAL HIGH (ref 65–99)
Potassium: 4.5 mmol/L (ref 3.5–5.3)
Sodium: 135 mmol/L (ref 135–146)

## 2015-10-09 LAB — CBC
HCT: 42.6 % (ref 39.0–52.0)
Hemoglobin: 14.2 g/dL (ref 13.0–17.0)
MCH: 26.8 pg (ref 26.0–34.0)
MCHC: 33.3 g/dL (ref 30.0–36.0)
MCV: 80.4 fL (ref 78.0–100.0)
MPV: 11.8 fL (ref 8.6–12.4)
Platelets: 259 10*3/uL (ref 150–400)
RBC: 5.3 MIL/uL (ref 4.22–5.81)
RDW: 15.2 % (ref 11.5–15.5)
WBC: 8.8 10*3/uL (ref 4.0–10.5)

## 2015-10-09 LAB — COMPLETE METABOLIC PANEL WITHOUT GFR
AST: 24 U/L (ref 10–40)
Alkaline Phosphatase: 56 U/L (ref 40–115)
BUN: 13 mg/dL (ref 7–25)
Calcium: 9.4 mg/dL (ref 8.6–10.3)
Creat: 0.97 mg/dL (ref 0.60–1.35)
GFR, Est African American: >89 mL/min (ref >=60)
Total Bilirubin: 0.4 mg/dL (ref 0.2–1.2)
Total Protein: 7.4 g/dL (ref 6.1–8.1)

## 2015-10-09 LAB — GLUCOSE, POCT (MANUAL RESULT ENTRY): POC Glucose: 312 mg/dL — AB (ref 70–99)

## 2015-10-09 LAB — POCT GLYCOSYLATED HEMOGLOBIN (HGB A1C): Hemoglobin A1C: 11.2

## 2015-10-09 LAB — TSH: TSH: 2.379 u[IU]/mL (ref 0.350–4.500)

## 2015-10-09 NOTE — Patient Instructions (Signed)
Diabetes Mellitus and Food It is important for you to manage your blood sugar (glucose) level. Your blood glucose level can be greatly affected by what you eat. Eating healthier foods in the appropriate amounts throughout the day at about the same time each day will help you control your blood glucose level. It can also help slow or prevent worsening of your diabetes mellitus. Healthy eating may even help you improve the level of your blood pressure and reach or maintain a healthy weight.  General recommendations for healthful eating and cooking habits include:  Eating meals and snacks regularly. Avoid going long periods of time without eating to lose weight.  Eating a diet that consists mainly of plant-based foods, such as fruits, vegetables, nuts, legumes, and whole grains.  Using low-heat cooking methods, such as baking, instead of high-heat cooking methods, such as deep frying. Work with your dietitian to make sure you understand how to use the Nutrition Facts information on food labels. HOW CAN FOOD AFFECT ME? Carbohydrates Carbohydrates affect your blood glucose level more than any other type of food. Your dietitian will help you determine how many carbohydrates to eat at each meal and teach you how to count carbohydrates. Counting carbohydrates is important to keep your blood glucose at a healthy level, especially if you are using insulin or taking certain medicines for diabetes mellitus. Alcohol Alcohol can cause sudden decreases in blood glucose (hypoglycemia), especially if you use insulin or take certain medicines for diabetes mellitus. Hypoglycemia can be a life-threatening condition. Symptoms of hypoglycemia (sleepiness, dizziness, and disorientation) are similar to symptoms of having too much alcohol.  If your health care provider has given you approval to drink alcohol, do so in moderation and use the following guidelines:  Women should not have more than one drink per day, and men  should not have more than two drinks per day. One drink is equal to:  12 oz of beer.  5 oz of wine.  1 oz of hard liquor.  Do not drink on an empty stomach.  Keep yourself hydrated. Have water, diet soda, or unsweetened iced tea.  Regular soda, juice, and other mixers might contain a lot of carbohydrates and should be counted. WHAT FOODS ARE NOT RECOMMENDED? As you make food choices, it is important to remember that all foods are not the same. Some foods have fewer nutrients per serving than other foods, even though they might have the same number of calories or carbohydrates. It is difficult to get your body what it needs when you eat foods with fewer nutrients. Examples of foods that you should avoid that are high in calories and carbohydrates but low in nutrients include:  Trans fats (most processed foods list trans fats on the Nutrition Facts label).  Regular soda.  Juice.  Candy.  Sweets, such as cake, pie, doughnuts, and cookies.  Fried foods. WHAT FOODS CAN I EAT? Eat nutrient-rich foods, which will nourish your body and keep you healthy. The food you should eat also will depend on several factors, including:  The calories you need.  The medicines you take.  Your weight.  Your blood glucose level.  Your blood pressure level.  Your cholesterol level. You should eat a variety of foods, including:  Protein.  Lean cuts of meat.  Proteins low in saturated fats, such as fish, egg whites, and beans. Avoid processed meats.  Fruits and vegetables.  Fruits and vegetables that may help control blood glucose levels, such as apples, mangoes, and  yams.  Dairy products.  Choose fat-free or low-fat dairy products, such as milk, yogurt, and cheese.  Grains, bread, pasta, and rice.  Choose whole grain products, such as multigrain bread, whole oats, and brown rice. These foods may help control blood pressure.  Fats.  Foods containing healthful fats, such as nuts,  avocado, olive oil, canola oil, and fish. DOES EVERYONE WITH DIABETES MELLITUS HAVE THE SAME MEAL PLAN? Because every person with diabetes mellitus is different, there is not one meal plan that works for everyone. It is very important that you meet with a dietitian who will help you create a meal plan that is just right for you.   This information is not intended to replace advice given to you by your health care provider. Make sure you discuss any questions you have with your health care provider.   Document Released: 07/08/2005 Document Revised: 11/01/2014 Document Reviewed: 09/07/2013 Elsevier Interactive Patient Education 2016 [place]. Diabetes and Standards of Medical Care Diabetes is complicated. You may find that your diabetes team includes a dietitian, nurse, diabetes educator, eye doctor, and more. To help everyone know what is going on and to help you get the care you deserve, the following schedule of care was developed to help keep you on track. Below are the tests, exams, vaccines, medicines, education, and plans you will need. HbA1c test This test shows how well you have controlled your glucose over the past 2-3 months. It is used to see if your diabetes management plan needs to be adjusted.   It is performed at least 2 times a year if you are meeting treatment goals.  It is performed 4 times a year if therapy has changed or if you are not meeting treatment goals. Blood pressure test  This test is performed at every routine medical visit. The goal is less than 140/90 mm Hg for most people, but 130/80 mm Hg in some cases. Ask your health care provider about your goal. Dental exam  Follow up with the dentist regularly. Eye exam  If you are diagnosed with type 1 diabetes as a child, get an exam upon reaching the age of 76 years or older and having had diabetes for 3-5 years. Yearly eye exams are recommended after that initial eye exam.  If you are diagnosed with type 1  diabetes as an adult, get an exam within 5 years of diagnosis and then yearly.  If you are diagnosed with type 2 diabetes, get an exam as soon as possible after the diagnosis and then yearly. Foot care exam  Visual foot exams are performed at every routine medical visit. The exams check for cuts, injuries, or other problems with the feet.  You should have a complete foot exam performed every year. This exam includes an inspection of the structure and skin of your feet, a check of the pulses in your feet, and a check of the sensation in your feet.  Type 1 diabetes: The first exam is performed 5 years after diagnosis.  Type 2 diabetes: The first exam is performed at the time of diagnosis.  Check your feet nightly for cuts, injuries, or other problems with your feet. Tell your health care provider if anything is not healing. Kidney function test (urine microalbumin)  This test is performed once a year.  Type 1 diabetes: The first test is performed 5 years after diagnosis.  Type 2 diabetes: The first test is performed at the time of diagnosis.  A serum creatinine and  estimated glomerular filtration rate (eGFR) test is done once a year to assess the level of chronic kidney disease (CKD), if present. Lipid profile (cholesterol, HDL, LDL, triglycerides)  Performed every 5 years for most people.  The goal for LDL is less than 100 mg/dL. If you are at high risk, the goal is less than 70 mg/dL.  The goal for HDL is 40 mg/dL-50 mg/dL for men and 50 mg/dL-60 mg/dL for women. An HDL cholesterol of 60 mg/dL or higher gives some protection against heart disease.  The goal for triglycerides is less than 150 mg/dL. Immunizations  The flu (influenza) vaccine is recommended yearly for every person 39 months of age or older who has diabetes.  The pneumonia (pneumococcal) vaccine is recommended for every person 57 years of age or older who has diabetes. Adults 72 years of age or older may receive the  pneumonia vaccine as a series of two separate shots.  The hepatitis B vaccine is recommended for adults shortly after they have been diagnosed with diabetes.  The Tdap (tetanus, diphtheria, and pertussis) vaccine should be given:  According to normal childhood vaccination schedules, for children.  Every 10 years, for adults who have diabetes. Diabetes self-management education  Education is recommended at diagnosis and ongoing as needed. Treatment plan  Your treatment plan is reviewed at every medical visit.   This information is not intended to replace advice given to you by your health care provider. Make sure you discuss any questions you have with your health care provider.   Document Released: 08/08/2009 Document Revised: 11/01/2014 Document Reviewed: 03/13/2013 Elsevier Interactive Patient Education [employer].

## 2015-10-09 NOTE — Progress Notes (Signed)
Chief Complaint: Annual   HPI: Jeffery Villa is a 29 y.o. 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 Hypertension   . High cholesterol   . Diabetes mellitus without complication ([place])     diagnosed at age 3 due to obesity, started on insulin at that time    Past Surgical History  Procedure Laterality Date  . No past surgeries    . Amputation Right 09/25/2014    Procedure: AMPUTATION RIGHT 5th RAY;  Surgeon: Newt Minion, MD;  Location: [place];  Service: Orthopedics;  Laterality: Right;   Social History   Social History  . Marital Status: Single    Spouse Name: N/A  . Number of Children: N/A  . Years of Education: N/A   Social History Main Topics  . Smoking status: Never Smoker   . Smokeless tobacco: None  . Alcohol Use: No  . Drug Use: No  . Sexual Activity: Not Asked   Other Topics Concern  . None   Social History Narrative   Family History  Problem Relation Age of Onset  . Diabetes Mother   . Hypertension Mother   . Stroke Mother   . Alcohol abuse Father   . Cancer Maternal Grandmother   . Diabetes Maternal Grandmother   . Hypertension Maternal Grandmother    No Known Allergies Prior to Admission  medications   Medication Sig Start Date End Date Taking? Authorizing Provider  atorvastatin (LIPITOR) 20 MG tablet Take 1 tablet (20 mg total) by mouth daily. 09/18/14  Yes Verlee Monte, MD  Insulin Glargine (LANTUS SOLOSTAR) 100 UNIT/ML Solostar Pen Inject 60 Units into the skin daily at 10 pm. 09/18/14  Yes Verlee Monte, MD  Insulin Pen Needle 30G X 5 MM MISC New needle with every insulin adminsration 09/18/14  Yes Verlee Monte, MD  metFORMIN (GLUCOPHAGE) 1000 MG tablet Take 1 tablet (1,000 mg total) by mouth 2 (two) times daily with a meal. 09/18/14  Yes Verlee Monte, MD  acetaminophen (TYLENOL) 325 MG tablet Take 650 mg by mouth every 6 (six) hours as needed for mild pain. Reported on 10/09/2015    Historical Provider, MD  amLODipine-benazepril (LOTREL) 10-40 MG per capsule Take 1 capsule by mouth daily. Patient not taking: Reported on 10/09/2015 09/18/14   Verlee Monte, MD  glucose monitoring kit (FREESTYLE) monitoring kit 1 each by Does not apply route as needed for other. Patient not taking: Reported on 10/09/2015 09/18/14   Verlee Monte, MD     ROS: The patient denies fevers, chills, night sweats, unintentional weight loss, chest pain, palpitations, wheezing, dyspnea on exertion, nausea, vomiting, abdominal pain, dysuria, hematuria, melena, acute numbness,

## 2015-10-10 LAB — MICROALBUMIN, URINE: Microalb, Ur: 22.5 mg/dL

## 2015-10-22 ENCOUNTER — Encounter: Payer: Self-pay | Admitting: Family Medicine

## 2017-11-20 DIAGNOSIS — E1169 Type 2 diabetes mellitus with other specified complication: Secondary | ICD-10-CM | POA: Diagnosis present

## 2017-11-20 DIAGNOSIS — E1159 Type 2 diabetes mellitus with other circulatory complications: Secondary | ICD-10-CM | POA: Diagnosis present

## 2019-07-12 DIAGNOSIS — Z6833 Body mass index (BMI) 33.0-33.9, adult: Secondary | ICD-10-CM

## 2020-12-02 DIAGNOSIS — N1831 Chronic kidney disease, stage 3a: Secondary | ICD-10-CM | POA: Diagnosis present

## 2020-12-08 ENCOUNTER — Encounter: Payer: Self-pay | Admitting: Orthopedic Surgery

## 2020-12-08 ENCOUNTER — Other Ambulatory Visit: Payer: Self-pay

## 2020-12-08 ENCOUNTER — Ambulatory Visit (INDEPENDENT_AMBULATORY_CARE_PROVIDER_SITE_OTHER): Payer: 59 | Admitting: Physician Assistant

## 2020-12-08 DIAGNOSIS — E1142 Type 2 diabetes mellitus with diabetic polyneuropathy: Secondary | ICD-10-CM

## 2020-12-08 NOTE — Progress Notes (Signed)
Office Visit Note   Patient: Jeffery Villa           Date of Birth: 12-01-1985           MRN: [id number] Visit Date: 12/08/2020              Requested by: No referring provider defined for this encounter. PCP: Patient, No Pcp Per  Chief Complaint  Patient presents with  . Right Foot - Pain      HPI: Patient comes in today for evaluation of discoloration of his right great toe.  He is status post fifth ray amputation by Dr. Lajoyce Corners remotely.  He works on a food truck and wears crocs and is unsure if he hit or injured the toe because he has neuropathy.  He was placed on doxycycline by his primary care provider.  He is also wondering if he can have several calluses trimmed on both of his feet.  Assessment & Plan: Visit Diagnoses: No diagnosis found.  Plan: I think the patient would do well in a pair of vive compression socks.  We also discussed the importance of getting a supportive shoe such as a [place] or a new balance.  Also discussed possibly getting orthotics from [facility].  I think he really needs to focus on Achilles stretching and he was assisted in demonstrated on how to do these exercises today.  He will wash his feet daily in the area of the removed blood blister he will place some antibiotic Bactroban and Band-Aid daily.  Follow-up for reevaluation in 2 weeks  Follow-Up Instructions: No follow-ups on file.   Ortho Exam  Patient is alert, oriented, no adenopathy, well-dressed, normal affect, normal respiratory effort. Bilateral easily palpable dorsalis pedis pulses.  Neither foot has any redness swelling or cellulitis.  On the right foot he is status post fifth ray amputation with a large callus on the lateral aspect of his foot.  This has a Band-Aid with just a spot of drainage but no surrounding cellulitis.  He also has a large callus on the medial side of his great toe.  At the end of the great toe he has a darkened what appears to be severely thick blood blister.  This was  debrided to healthy skin.  There was nothing that would probe to bone no necrotic areas no foul odor all calluses were trimmed to softer surfaces. On the left foot he had calluses on the inside of the great toe as well as the base of the MTP joint.  He had no surrounding cellulitis no swelling.  These were also debrided to healthy surfaces he does have significant tightness in both of his Achilles  Imaging: No results found. No images are attached to the encounter.  Labs: Lab Results  Component Value Date   HGBA1C 11.2 10/09/2015   HGBA1C 10.4 (H) 09/17/2014   HGBA1C 10.3 (H) 09/16/2014   ESRSEDRATE 38 (H) 09/16/2014   CRP 2.7 (H) 09/16/2014   REPTSTATUS 09/23/2014 FINAL 09/16/2014   CULT  09/16/2014    NO GROWTH 5 DAYS Performed at [facility]      Lab Results  Component Value Date   ALBUMIN 4.3 10/09/2015   ALBUMIN 4.0 09/16/2014    No results found for: MG No results found for: VD25OH  No results found for: PREALBUMIN CBC EXTENDED Latest Ref Rng & Units 10/09/2015 09/25/2014 09/18/2014  WBC 4.0 - 10.5 K/uL 8.8 11.9(H) 10.7(H)  RBC 4.22 - 5.81 MIL/uL 5.30 4.91 4.69  HGB 13.0 - 17.0 g/dL 88.4 16.6 12.2(L)  HCT 39.0 - 52.0 % 42.6 38.3(L) 36.8(L)  PLT 150 - 400 K/uL 259 434(H) 272  NEUTROABS 1.7 - 7.7 K/uL - - -  LYMPHSABS 0.7 - 4.0 K/uL - - -     There is no height or weight on file to calculate BMI.  Orders:  No orders of the defined types were placed in this encounter.  No orders of the defined types were placed in this encounter.    Procedures: No procedures performed  Clinical Data: No additional findings.  ROS:  All other systems negative, except as noted in the HPI. Review of Systems  Objective: Vital Signs: There were no vitals taken for this visit.  Specialty Comments:  No specialty comments available.  PMFS History: Patient Active Problem List   Diagnosis Date Noted  . Diabetes mellitus, insulin dependent (IDDM), uncontrolled  09/18/2014  . Diabetic peripheral neuropathy (HCC) 09/18/2014  . Diabetic foot ulcer (HCC) 09/18/2014  . Cellulitis 09/16/2014  . Cellulitis of right foot    Past Medical History:  Diagnosis Date  . Diabetes mellitus without complication (HCC)    diagnosed at age 60 due to obesity, started on insulin at that time   . High cholesterol   . Hypertension     Family History  Problem Relation Age of Onset  . Diabetes Mother   . Hypertension Mother   . Stroke Mother   . Alcohol abuse Father   . Cancer Maternal Grandmother   . Diabetes Maternal Grandmother   . Hypertension Maternal Grandmother     Past Surgical History:  Procedure Laterality Date  . AMPUTATION Right 09/25/2014   Procedure: AMPUTATION RIGHT 5th RAY;  Surgeon: Nadara Mustard, MD;  [facility];  Service: Orthopedics;  Laterality: Right;  . NO PAST SURGERIES     Social History   Occupational History  . Not on file  Tobacco Use  . Smoking status: Never Smoker  . Smokeless tobacco: Not on file  Substance and Sexual Activity  . Alcohol use: No    Alcohol/week: 0.0 standard drinks  . Drug use: No  . Sexual activity: Not on file

## 2020-12-22 ENCOUNTER — Ambulatory Visit (INDEPENDENT_AMBULATORY_CARE_PROVIDER_SITE_OTHER): Payer: 59 | Admitting: Orthopedic Surgery

## 2020-12-22 ENCOUNTER — Encounter: Payer: Self-pay | Admitting: Orthopedic Surgery

## 2020-12-22 DIAGNOSIS — M6701 Short Achilles tendon (acquired), right ankle: Secondary | ICD-10-CM | POA: Diagnosis not present

## 2020-12-22 DIAGNOSIS — E1142 Type 2 diabetes mellitus with diabetic polyneuropathy: Secondary | ICD-10-CM | POA: Diagnosis not present

## 2020-12-22 DIAGNOSIS — L97511 Non-pressure chronic ulcer of other part of right foot limited to breakdown of skin: Secondary | ICD-10-CM | POA: Diagnosis not present

## 2020-12-22 DIAGNOSIS — M6702 Short Achilles tendon (acquired), left ankle: Secondary | ICD-10-CM | POA: Diagnosis not present

## 2020-12-22 NOTE — Progress Notes (Signed)
Office Visit Note   Patient: Jeffery Villa           Date of Birth: 07-05-1986           MRN: [id number] Visit Date: 12/22/2020              Requested by: No referring provider defined for this encounter. PCP: Patient, No Pcp Per  Chief Complaint  Patient presents with  . Right Foot - Follow-up    GT ulcer       HPI: Patient is a 35 year old gentleman who was seen in follow-up for callus and ulcerations both feet with diabetic insensate neuropathy and Achilles contracture.  Assessment & Plan: Visit Diagnoses:  1. Diabetic peripheral neuropathy (HCC)   2. Non-pressure chronic ulcer of other part of right foot limited to breakdown of skin (HCC)     Plan: Recommended Achilles stretching Band-Aid over the ulcer base of the fifth metatarsal right foot continue extra-depth protective sneakers.  Follow-Up Instructions: Return in about 3 weeks (around 01/12/2021).   Ortho Exam  Patient is alert, oriented, no adenopathy, well-dressed, normal affect, normal respiratory effort. Examination patient has good pulses at the ankle with his knee extended he has dorsiflexion to neutral with Achilles tightness bilaterally. He has multiple calluses on the base of both feet after informed consent a 10 blade knife was used to pare the callus on the left foot beneath the base of the fifth metatarsal and on the fifth metatarsal head as well as beneath the first metatarsal head. Patient also has a Wagner grade 1 ulcer beneath the base of the fifth metatarsal right foot after informed consent a 10 blade knife was used to debride the skin and soft tissue back to healthy viable granulation tissue this was touched with silver nitrate a Band-Aid was applied predebridement the ulcer is 1 cm diameter after debridement the ulcer is 3 cm in diameter and 3 mm deep. There is no exposed bone or tendon no abscess.  Imaging: No results found. No images are attached to the encounter.  Labs: Lab Results   Component Value Date   HGBA1C 11.2 10/09/2015   HGBA1C 10.4 (H) 09/17/2014   HGBA1C 10.3 (H) 09/16/2014   ESRSEDRATE 38 (H) 09/16/2014   CRP 2.7 (H) 09/16/2014   REPTSTATUS 09/23/2014 FINAL 09/16/2014   CULT  09/16/2014    NO GROWTH 5 DAYS Performed at [facility]      Lab Results  Component Value Date   ALBUMIN 4.3 10/09/2015   ALBUMIN 4.0 09/16/2014    No results found for: MG No results found for: VD25OH  No results found for: PREALBUMIN CBC EXTENDED Latest Ref Rng & Units 10/09/2015 09/25/2014 09/18/2014  WBC 4.0 - 10.5 K/uL 8.8 11.9(H) 10.7(H)  RBC 4.22 - 5.81 MIL/uL 5.30 4.91 4.69  HGB 13.0 - 17.0 g/dL 38.4 66.5 12.2(L)  HCT 39.0 - 52.0 % 42.6 38.3(L) 36.8(L)  PLT 150 - 400 K/uL 259 434(H) 272  NEUTROABS 1.7 - 7.7 K/uL - - -  LYMPHSABS 0.7 - 4.0 K/uL - - -     There is no height or weight on file to calculate BMI.  Orders:  No orders of the defined types were placed in this encounter.  No orders of the defined types were placed in this encounter.    Procedures: No procedures performed  Clinical Data: No additional findings.  ROS:  All other systems negative, except as noted in the HPI. Review of Systems  Objective: Vital  Signs: There were no vitals taken for this visit.  Specialty Comments:  No specialty comments available.  PMFS History: Patient Active Problem List   Diagnosis Date Noted  . Diabetes mellitus, insulin dependent (IDDM), uncontrolled 09/18/2014  . Diabetic peripheral neuropathy (HCC) 09/18/2014  . Diabetic foot ulcer (HCC) 09/18/2014  . Cellulitis 09/16/2014  . Cellulitis of right foot    Past Medical History:  Diagnosis Date  . Diabetes mellitus without complication (HCC)    diagnosed at age 28 due to obesity, started on insulin at that time   . High cholesterol   . Hypertension     Family History  Problem Relation Age of Onset  . Diabetes Mother   . Hypertension Mother   . Stroke Mother   . Alcohol abuse  Father   . Cancer Maternal Grandmother   . Diabetes Maternal Grandmother   . Hypertension Maternal Grandmother     Past Surgical History:  Procedure Laterality Date  . AMPUTATION Right 09/25/2014   Procedure: AMPUTATION RIGHT 5th RAY;  Surgeon: Nadara Mustard, MD;  Location[facility] OR;  Service: Orthopedics;  Laterality: Right;  . NO PAST SURGERIES     Social History   Occupational History  . Not on file  Tobacco Use  . Smoking status: Never Smoker  . Smokeless tobacco: Not on file  Substance and Sexual Activity  . Alcohol use: No    Alcohol/week: 0.0 standard drinks  . Drug use: No  . Sexual activity: Not on file

## 2021-01-12 ENCOUNTER — Ambulatory Visit (INDEPENDENT_AMBULATORY_CARE_PROVIDER_SITE_OTHER): Payer: 59 | Admitting: Orthopedic Surgery

## 2021-01-12 ENCOUNTER — Encounter: Payer: Self-pay | Admitting: Orthopedic Surgery

## 2021-01-12 DIAGNOSIS — M6702 Short Achilles tendon (acquired), left ankle: Secondary | ICD-10-CM | POA: Diagnosis not present

## 2021-01-12 DIAGNOSIS — E1142 Type 2 diabetes mellitus with diabetic polyneuropathy: Secondary | ICD-10-CM

## 2021-01-12 DIAGNOSIS — M6701 Short Achilles tendon (acquired), right ankle: Secondary | ICD-10-CM

## 2021-01-12 DIAGNOSIS — L97511 Non-pressure chronic ulcer of other part of right foot limited to breakdown of skin: Secondary | ICD-10-CM | POA: Diagnosis not present

## 2021-01-12 NOTE — Progress Notes (Signed)
Office Visit Note   Patient: Jeffery Villa           Date of Birth: [birth year]           MRN: [id number] Visit Date: 01/12/2021              Requested by: No referring provider defined for this encounter. PCP: Patient, No Pcp Per  Chief Complaint  Patient presents with  . Right Foot - Follow-up      HPI: Patient is a 35 year old gentleman with diabetic insensate neuropathy Achilles contracture bilaterally with Wagner grade 1 ulcers on both feet.  Patient states he has been doing his Achilles stretching and is in a soft sneaker.  Assessment & Plan: Visit Diagnoses:  1. Diabetic peripheral neuropathy (HCC)   2. Non-pressure chronic ulcer of other part of right foot limited to breakdown of skin (HCC)   3. Achilles tendon contracture, bilateral     Plan: Continue Achilles stretching continue protective shoe wear  Follow-Up Instructions: Return in about 4 weeks (around 02/09/2021).   Ortho Exam  Patient is alert, oriented, no adenopathy, well-dressed, normal affect, normal respiratory effort. Examination patient has good pulses bilaterally he has dorsiflexion to neutral bilaterally with persistent Achilles contracture he has 3 ulcers on the plantar aspect the left foot beneath the base of the fifth metatarsal fifth metatarsal head and first metatarsal head.  After informed consent a 10 blade knife was used to debride the skin and soft tissue back to healthy viable tissue these ulcers were 2 cm in diameter 2 mm deep after debridement prior to debridement these were all 1 cm diameter.  Examination of the right foot patient has a ulcer over the tip of the right great toe as well as the base of the fifth metatarsal.  After informed consent a 10 blade knife was used to debride these 2 areas both had healthy granulation tissue that was touched with silver nitrate the base of the fifth metatarsal ulcer after debridement is 3 cm in diameter 2 mm deep prior to debridement this was 1 cm  diameter.  Right great toe was also debrided after debridement this ulcer was 5 mm in diameter with healthy granulation tissue no exposed bone or tendon prior to debridement the ulcer was 1 mm diameter.  Imaging: No results found. No images are attached to the encounter.  Labs: Lab Results  Component Value Date   HGBA1C 11.2 10/09/2015   HGBA1C 10.4 (H) 09/17/2014   HGBA1C 10.3 (H) 09/16/2014   ESRSEDRATE 38 (H) 09/16/2014   CRP 2.7 (H) 09/16/2014   REPTSTATUS 09/23/2014 FINAL 09/16/2014   CULT  09/16/2014    NO GROWTH 5 DAYS Performed at [facility]      Lab Results  Component Value Date   ALBUMIN 4.3 10/09/2015   ALBUMIN 4.0 09/16/2014    No results found for: MG No results found for: VD25OH  No results found for: PREALBUMIN CBC EXTENDED Latest Ref Rng & Units 10/09/2015 09/25/2014 09/18/2014  WBC 4.0 - 10.5 K/uL 8.8 11.9(H) 10.7(H)  RBC 4.22 - 5.81 MIL/uL 5.30 4.91 4.69  HGB 13.0 - 17.0 g/dL 43.3 29.5 12.2(L)  HCT 39.0 - 52.0 % 42.6 38.3(L) 36.8(L)  PLT 150 - 400 K/uL 259 434(H) 272  NEUTROABS 1.7 - 7.7 K/uL - - -  LYMPHSABS 0.7 - 4.0 K/uL - - -     There is no height or weight on file to calculate BMI.  Orders:  No orders of  the defined types were placed in this encounter.  No orders of the defined types were placed in this encounter.    Procedures: No procedures performed  Clinical Data: No additional findings.  ROS:  All other systems negative, except as noted in the HPI. Review of Systems  Objective: Vital Signs: There were no vitals taken for this visit.  Specialty Comments:  No specialty comments available.  PMFS History: Patient Active Problem List   Diagnosis Date Noted  . Diabetes mellitus, insulin dependent (IDDM), uncontrolled 09/18/2014  . Diabetic peripheral neuropathy (HCC) 09/18/2014  . Diabetic foot ulcer (HCC) 09/18/2014  . Cellulitis 09/16/2014  . Cellulitis of right foot    Past Medical History:  Diagnosis  Date  . Diabetes mellitus without complication (HCC)    diagnosed at age 23 due to obesity, started on insulin at that time   . High cholesterol   . Hypertension     Family History  Problem Relation Age of Onset  . Diabetes Mother   . Hypertension Mother   . Stroke Mother   . Alcohol abuse Father   . Cancer Maternal Grandmother   . Diabetes Maternal Grandmother   . Hypertension Maternal Grandmother     Past Surgical History:  Procedure Laterality Date  . AMPUTATION Right 09/25/2014   Procedure: AMPUTATION RIGHT 5th RAY;  Surgeon: Nadara Mustard, MD;  Location: [facility] OR;  Service: Orthopedics;  Laterality: Right;  . NO PAST SURGERIES     Social History   Occupational History  . Not on file  Tobacco Use  . Smoking status: Never Smoker  . Smokeless tobacco: Not on file  Substance and Sexual Activity  . Alcohol use: No    Alcohol/week: 0.0 standard drinks  . Drug use: No  . Sexual activity: Not on file

## 2021-02-09 ENCOUNTER — Ambulatory Visit: Payer: 59 | Admitting: Orthopedic Surgery

## 2021-03-10 ENCOUNTER — Ambulatory Visit: Payer: 59 | Admitting: Family

## 2021-03-17 ENCOUNTER — Ambulatory Visit: Payer: 59 | Admitting: Family

## 2021-03-18 ENCOUNTER — Ambulatory Visit: Payer: 59 | Admitting: Physician Assistant

## 2021-07-14 ENCOUNTER — Encounter: Payer: Self-pay | Admitting: Physician Assistant

## 2021-07-14 ENCOUNTER — Ambulatory Visit (INDEPENDENT_AMBULATORY_CARE_PROVIDER_SITE_OTHER): Payer: 59

## 2021-07-14 ENCOUNTER — Other Ambulatory Visit: Payer: Self-pay

## 2021-07-14 ENCOUNTER — Ambulatory Visit (INDEPENDENT_AMBULATORY_CARE_PROVIDER_SITE_OTHER): Payer: 59 | Admitting: Physician Assistant

## 2021-07-14 ENCOUNTER — Ambulatory Visit: Payer: Self-pay

## 2021-07-14 DIAGNOSIS — M6702 Short Achilles tendon (acquired), left ankle: Secondary | ICD-10-CM | POA: Diagnosis not present

## 2021-07-14 DIAGNOSIS — L97511 Non-pressure chronic ulcer of other part of right foot limited to breakdown of skin: Secondary | ICD-10-CM

## 2021-07-14 MED ORDER — MUPIROCIN 2 % EX OINT: 1.0000 "application " | TOPICAL_OINTMENT | Freq: Two times a day (BID) | CUTANEOUS | 3 refills | Status: DC

## 2021-07-14 MED ORDER — DOXYCYCLINE HYCLATE 100 MG PO TABS: 100.0000 mg | ORAL_TABLET | Freq: Two times a day (BID) | ORAL | 0 refills | Status: DC

## 2021-07-14 NOTE — Progress Notes (Signed)
Office Visit Note   Patient: Jeffery Villa           Date of Birth: [birth year]           MRN: [id number] Visit Date: 07/14/2021              Requested by: No referring provider defined for this encounter. PCP: Patient, [name]  Chief Complaint  Patient presents with   Right Foot - Follow-up   Left Foot - Follow-up      HPI: Patient presents today with a complaint of a right lateral foot ulcer.  Also a small ulcer on the PIP joint of his left second toe.  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  He was previously seen by Dr. Lajoyce Corners in March for the same issue on the right foot.  He says he does notice more drainage.  He admits he is try to debride the callus himself.  He does regularly work on Achilles stretching as was told to him.  He wears compression as socks for venous insufficiency  Assessment & Plan: Visit Diagnoses:  1. Non-pressure chronic ulcer of other part of right foot limited to breakdown of skin [facility])     Plan: Patient will go on a short course of doxycycline.  Also will purchase some more vive compression socks.  Have placed a small felt relieving doughnut in his shoe.  Also gave him a prescription to obtain an orthotic for pressure relief status post fifth ray amputation.  Follow-Up Instructions: No follow-ups on file.   Ortho Exam  Patient is alert, oriented, no adenopathy, well-dressed, normal affect, normal respiratory effort. Bilateral feet strong dorsalis pedis pulses are palpable.  On the right foot he is status post fifth ray amputation.  He has some swelling but no cellulitis in the feet going up into the lower leg.  No open ulcers.  On the leg.  On the foot on the lateral side he does have a ulcer with surrounding callus that measures about 2 cm in diameter does not probe to bone.  After obtaining verbal consent this was trimmed to a healthy surface no foul odor no ascending cellulitis.  On the left foot he has a small  ulcer at the PIP joint of the second toe which is  Mild soft tissue swelling no cellulitis  Imaging: No results found. No images are attached to the encounter.  Labs: Lab Results  Component Value Date   HGBA1C 11.2 10/09/2015   HGBA1C 10.4 (H) 09/17/2014   HGBA1C 10.3 (H) 09/16/2014   ESRSEDRATE 38 (H) 09/16/2014   CRP 2.7 (H) 09/16/2014   REPTSTATUS 09/23/2014 FINAL 09/16/2014   CULT  09/16/2014    NO GROWTH 5 DAYS Performed at [facility]      Lab Results  Component Value Date   ALBUMIN 4.3 10/09/2015   ALBUMIN 4.0 09/16/2014    No results found for: MG No results found for: VD25OH  No results found for: PREALBUMIN CBC EXTENDED Latest Ref Rng & Units 10/09/2015 09/25/2014 09/18/2014  WBC 4.0 - 10.5 K/uL 8.8 11.9(H) 10.7(H)  RBC 4.22 - 5.81 MIL/uL 5.30 4.91 4.69  HGB 13.0 - 17.0 g/dL 00.9 38.1 12.2(L)  HCT 39.0 - 52.0 % 42.6 38.3(L) 36.8(L)  PLT 150 - 400 K/uL 259 434(H) 272  NEUTROABS 1.7 - 7.7 K/uL - - -  LYMPHSABS 0.7 - 4.0 K/uL - - -     There is no  height or weight on file to calculate BMI.  Orders:  Orders Placed This Encounter  Procedures   XR Foot Complete Right   XR Foot Complete Left   Meds ordered this encounter  Medications   mupirocin ointment (BACTROBAN) 2 %    Sig: Apply 1 application topically 2 (two) times daily. Apply to the affected area 2 times a day    Dispense:  22 g    Refill:  3   doxycycline (VIBRA-TABS) 100 MG tablet    Sig: Take 1 tablet (100 mg total) by mouth 2 (two) times daily.    Dispense:  30 tablet    Refill:  0     Procedures: No procedures performed  Clinical Data: No additional findings.  ROS:  All other systems negative, except as noted in the HPI. Review of Systems  Objective: Vital Signs: There were no vitals taken for this visit.  Specialty Comments:  No specialty comments available.  PMFS History: Patient Active Problem List   Diagnosis Date Noted   Diabetes mellitus, insulin  dependent (IDDM), uncontrolled 09/18/2014   Diabetic peripheral neuropathy (HCC) 09/18/2014   Diabetic foot ulcer (HCC) 09/18/2014   Cellulitis 09/16/2014   Cellulitis of right foot    Past Medical History:  Diagnosis Date   Diabetes mellitus without complication (HCC)    diagnosed at age 11 due to obesity, started on insulin at that time    High cholesterol    Hypertension     Family History  Problem Relation Age of Onset   Diabetes Mother    Hypertension Mother    Stroke Mother    Alcohol abuse Father    Cancer Maternal Grandmother    Diabetes Maternal Grandmother    Hypertension Maternal Grandmother     Past Surgical History:  Procedure Laterality Date   AMPUTATION Right 09/25/2014   Procedure: AMPUTATION RIGHT 5th RAY;  Surgeon: Nadara Mustard, MD;  Location: [facility] OR;  Service: Orthopedics;  Laterality: Right;   NO PAST SURGERIES     Social History   Occupational History   Not on file  Tobacco Use   Smoking status: Never   Smokeless tobacco: Not on file  Substance and Sexual Activity   Alcohol use: No    Alcohol/week: 0.0 standard drinks   Drug use: No   Sexual activity: Not on file

## 2021-07-24 DIAGNOSIS — R9430 Abnormal result of cardiovascular function study, unspecified: Secondary | ICD-10-CM | POA: Diagnosis present

## 2021-07-24 DIAGNOSIS — R931 Abnormal findings on diagnostic imaging of heart and coronary circulation: Secondary | ICD-10-CM | POA: Diagnosis present

## 2021-07-28 ENCOUNTER — Encounter: Payer: Self-pay | Admitting: Orthopedic Surgery

## 2021-07-28 ENCOUNTER — Ambulatory Visit (INDEPENDENT_AMBULATORY_CARE_PROVIDER_SITE_OTHER): Payer: 59 | Admitting: Orthopedic Surgery

## 2021-07-28 DIAGNOSIS — L97511 Non-pressure chronic ulcer of other part of right foot limited to breakdown of skin: Secondary | ICD-10-CM | POA: Diagnosis not present

## 2021-07-28 DIAGNOSIS — M79671 Pain in right foot: Secondary | ICD-10-CM | POA: Diagnosis not present

## 2021-07-28 NOTE — Progress Notes (Signed)
Office Visit Note   Patient: Jeffery Villa           Date of Birth: 1986-07-07           MRN: [id number] Visit Date: 07/28/2021              Requested by: No referring provider defined for this encounter. PCP: Patient, [name]  Chief Complaint  Patient presents with   Right Foot - Follow-up      HPI: Patient is a 35 year old gentleman who is currently ambulating in soft on cloud shoes he is status post the fifth ray amputation and has a persistent Wagner grade 1 ulcer beneath the base of the fifth metatarsal he is currently wearing knee-high compression stockings and completing a course of doxycycline.  Assessment & Plan: Visit Diagnoses:  1. Non-pressure chronic ulcer of other part of right foot limited to breakdown of skin (HCC)     Plan: Patient was placed in a postoperative shoe a felt relieving donut was placed to unload the ulcer.  Continue with the compression stocking.  Follow-Up Instructions: Return in about 3 weeks (around 08/18/2021).   Ortho Exam  Patient is alert, oriented, no adenopathy, well-dressed, normal affect, normal respiratory effort. Examination patient has a good dorsalis pedis pulse there is a Wagner grade 1 ulcer beneath the base of the fifth metatarsal there is no redness no cellulitis no odor no drainage no signs of infection.  Patient has good dorsiflexion of the ankle of 20 degrees.  After informed consent a 10 blade knife was used to debride the skin and soft tissue back to healthy viable granulation tissue.  Silver nitrate was used hemostasis after debridement the ulcer is 3 cm in diameter and 2 mm deep.  Imaging: No results found. No images are attached to the encounter.  Labs: Lab Results  Component Value Date   HGBA1C 11.2 10/09/2015   HGBA1C 10.4 (H) 09/17/2014   HGBA1C 10.3 (H) 09/16/2014   ESRSEDRATE 38 (H) 09/16/2014   CRP 2.7 (H) 09/16/2014   REPTSTATUS 09/23/2014 FINAL 09/16/2014   CULT  09/16/2014    NO  GROWTH 5 DAYS Performed at [facility]      Lab Results  Component Value Date   ALBUMIN 4.3 10/09/2015   ALBUMIN 4.0 09/16/2014    No results found for: MG No results found for: VD25OH  No results found for: PREALBUMIN CBC EXTENDED Latest Ref Rng & Units 10/09/2015 09/25/2014 09/18/2014  WBC 4.0 - 10.5 K/uL 8.8 11.9(H) 10.7(H)  RBC 4.22 - 5.81 MIL/uL 5.30 4.91 4.69  HGB 13.0 - 17.0 g/dL 45.8 09.9 12.2(L)  HCT 39.0 - 52.0 % 42.6 38.3(L) 36.8(L)  PLT 150 - 400 K/uL 259 434(H) 272  NEUTROABS 1.7 - 7.7 K/uL - - -  LYMPHSABS 0.7 - 4.0 K/uL - - -     There is no height or weight on file to calculate BMI.  Orders:  No orders of the defined types were placed in this encounter.  No orders of the defined types were placed in this encounter.    Procedures: No procedures performed  Clinical Data: No additional findings.  ROS:  All other systems negative, except as noted in the HPI. Review of Systems  Objective: Vital Signs: There were no vitals taken for this visit.  Specialty Comments:  No specialty comments available.  PMFS History: Patient Active Problem List   Diagnosis Date Noted   Diabetes mellitus, insulin dependent (IDDM), uncontrolled 09/18/2014  Diabetic peripheral neuropathy (HCC) 09/18/2014   Diabetic foot ulcer (HCC) 09/18/2014   Cellulitis 09/16/2014   Cellulitis of right foot    Past Medical History:  Diagnosis Date   Diabetes mellitus without complication (HCC)    diagnosed at age 50 due to obesity, started on insulin at that time    High cholesterol    Hypertension     Family History  Problem Relation Age of Onset   Diabetes Mother    Hypertension Mother    Stroke Mother    Alcohol abuse Father    Cancer Maternal Grandmother    Diabetes Maternal Grandmother    Hypertension Maternal Grandmother     Past Surgical History:  Procedure Laterality Date   AMPUTATION Right 09/25/2014   Procedure: AMPUTATION RIGHT 5th RAY;  Surgeon:  Nadara Mustard, MD;  Location: [facility] OR;  Service: Orthopedics;  Laterality: Right;   NO PAST SURGERIES     Social History   Occupational History   Not on file  Tobacco Use   Smoking status: Never   Smokeless tobacco: Not on file  Substance and Sexual Activity   Alcohol use: No    Alcohol/week: 0.0 standard drinks   Drug use: No   Sexual activity: Not on file

## 2021-07-29 DIAGNOSIS — I340 Nonrheumatic mitral (valve) insufficiency: Secondary | ICD-10-CM | POA: Diagnosis present

## 2021-07-29 DIAGNOSIS — I071 Rheumatic tricuspid insufficiency: Secondary | ICD-10-CM | POA: Diagnosis present

## 2021-08-11 ENCOUNTER — Ambulatory Visit: Payer: 59 | Admitting: Family

## 2021-08-18 ENCOUNTER — Ambulatory Visit: Payer: 59 | Admitting: Family

## 2021-08-18 ENCOUNTER — Ambulatory Visit (INDEPENDENT_AMBULATORY_CARE_PROVIDER_SITE_OTHER): Payer: 59 | Admitting: Family

## 2021-08-18 ENCOUNTER — Other Ambulatory Visit: Payer: Self-pay

## 2021-08-18 ENCOUNTER — Encounter: Payer: Self-pay | Admitting: Family

## 2021-08-18 DIAGNOSIS — E13621 Other specified diabetes mellitus with foot ulcer: Secondary | ICD-10-CM | POA: Diagnosis not present

## 2021-08-18 DIAGNOSIS — Z89421 Acquired absence of other right toe(s): Secondary | ICD-10-CM | POA: Diagnosis not present

## 2021-08-18 DIAGNOSIS — L97411 Non-pressure chronic ulcer of right heel and midfoot limited to breakdown of skin: Secondary | ICD-10-CM

## 2021-08-18 NOTE — Progress Notes (Signed)
Office Visit Note   Patient: Jeffery Villa           Date of Birth: [birth year]           MRN: [id number] Visit Date: 08/18/2021              Requested by: No referring provider defined for this encounter. PCP: Patient, [name]  Chief Complaint  Patient presents with   Right Foot - Wound Check    Ulcer check      HPI: Patient is a 35 year old gentleman who is currently ambulating in soft on cloud shoes he is status post the fifth ray amputation and has a persistent Wagner grade 1 ulcer beneath the base of the fifth metatarsal he is currently wearing knee-high compression stockings.  Has completed a course of doxycycline  Assessment & Plan: Visit Diagnoses:  1. Diabetic ulcer of right midfoot associated with diabetes mellitus of other type, limited to breakdown of skin (HCC)   2. S/P amputation of lesser toe, right (HCC)     Plan: Given a felt relieving donut was placed to unload the ulcer in his regular shoe wear. Continue with the compression stocking.  Follow-Up Instructions: Return in about 4 weeks (around 09/15/2021).   Ortho Exam  Patient is alert, oriented, no adenopathy, well-dressed, normal affect, normal respiratory effort. Examination patient has a good dorsalis pedis pulse there is a Wagner grade 1 ulcer beneath the base of the fifth metatarsal there is no redness no cellulitis no odor no drainage no signs of infection.  Patient has good dorsiflexion of the ankle of 20 degrees.  After informed consent a 10 blade knife was used to debride the skin and soft tissue back to healthy viable granulation tissue.  After debridement the ulcer is 3 mm in diameter and 2 mm deep.  Imaging: No results found. No images are attached to the encounter.  Labs: Lab Results  Component Value Date   HGBA1C 11.2 10/09/2015   HGBA1C 10.4 (H) 09/17/2014   HGBA1C 10.3 (H) 09/16/2014   ESRSEDRATE 38 (H) 09/16/2014   CRP 2.7 (H) 09/16/2014   REPTSTATUS 09/23/2014  FINAL 09/16/2014   CULT  09/16/2014    NO GROWTH 5 DAYS Performed at [facility]      Lab Results  Component Value Date   ALBUMIN 4.3 10/09/2015   ALBUMIN 4.0 09/16/2014    No results found for: MG No results found for: VD25OH  No results found for: PREALBUMIN CBC EXTENDED Latest Ref Rng & Units 10/09/2015 09/25/2014 09/18/2014  WBC 4.0 - 10.5 K/uL 8.8 11.9(H) 10.7(H)  RBC 4.22 - 5.81 MIL/uL 5.30 4.91 4.69  HGB 13.0 - 17.0 g/dL 96.7 89.3 12.2(L)  HCT 39.0 - 52.0 % 42.6 38.3(L) 36.8(L)  PLT 150 - 400 K/uL 259 434(H) 272  NEUTROABS 1.7 - 7.7 K/uL - - -  LYMPHSABS 0.7 - 4.0 K/uL - - -     There is no height or weight on file to calculate BMI.  Orders:  No orders of the defined types were placed in this encounter.  No orders of the defined types were placed in this encounter.    Procedures: No procedures performed  Clinical Data: No additional findings.  ROS:  All other systems negative, except as noted in the HPI. Review of Systems  Objective: Vital Signs: There were no vitals taken for this visit.  Specialty Comments:  No specialty comments available.  PMFS History: Patient Active Problem List  Diagnosis Date Noted   S/P amputation of lesser toe, right (HCC) 08/18/2021   Diabetes mellitus, insulin dependent (IDDM), uncontrolled 09/18/2014   Diabetic peripheral neuropathy (HCC) 09/18/2014   Diabetic foot ulcer (HCC) 09/18/2014   Cellulitis 09/16/2014   Cellulitis of right foot    Past Medical History:  Diagnosis Date   Diabetes mellitus without complication (HCC)    diagnosed at age 29 due to obesity, started on insulin at that time    High cholesterol    Hypertension     Family History  Problem Relation Age of Onset   Diabetes Mother    Hypertension Mother    Stroke Mother    Alcohol abuse Father    Cancer Maternal Grandmother    Diabetes Maternal Grandmother    Hypertension Maternal Grandmother     Past Surgical History:   Procedure Laterality Date   AMPUTATION Right 09/25/2014   Procedure: AMPUTATION RIGHT 5th RAY;  Surgeon: Nadara Mustard, MD;  Location: [facility] OR;  Service: Orthopedics;  Laterality: Right;   NO PAST SURGERIES     Social History   Occupational History   Not on file  Tobacco Use   Smoking status: Never   Smokeless tobacco: Not on file  Substance and Sexual Activity   Alcohol use: No    Alcohol/week: 0.0 standard drinks   Drug use: No   Sexual activity: Not on file

## 2021-09-15 ENCOUNTER — Ambulatory Visit: Payer: 59 | Admitting: Orthopedic Surgery

## 2021-11-10 ENCOUNTER — Ambulatory Visit: Payer: Self-pay | Admitting: Family

## 2021-11-30 ENCOUNTER — Ambulatory Visit: Payer: Self-pay | Admitting: Orthopedic Surgery

## 2021-12-01 ENCOUNTER — Ambulatory Visit: Payer: Self-pay | Admitting: Orthopedic Surgery

## 2022-03-05 ENCOUNTER — Emergency Department (HOSPITAL_BASED_OUTPATIENT_CLINIC_OR_DEPARTMENT_OTHER): Payer: 59

## 2022-03-05 ENCOUNTER — Inpatient Hospital Stay (HOSPITAL_BASED_OUTPATIENT_CLINIC_OR_DEPARTMENT_OTHER)
Admission: EM | Admit: 2022-03-05 | Discharge: 2022-03-09 | DRG: 871 | Disposition: A | Discharge status: Home / Self Care | Payer: 59 | Attending: Internal Medicine | Admitting: Internal Medicine

## 2022-03-05 ENCOUNTER — Encounter (HOSPITAL_BASED_OUTPATIENT_CLINIC_OR_DEPARTMENT_OTHER): Payer: Self-pay

## 2022-03-05 ENCOUNTER — Other Ambulatory Visit: Payer: Self-pay

## 2022-03-05 ENCOUNTER — Emergency Department (HOSPITAL_BASED_OUTPATIENT_CLINIC_OR_DEPARTMENT_OTHER): Payer: 59 | Admitting: Radiology

## 2022-03-05 DIAGNOSIS — Z7984 Long term (current) use of oral hypoglycemic drugs: Secondary | ICD-10-CM

## 2022-03-05 DIAGNOSIS — I5043 Acute on chronic combined systolic (congestive) and diastolic (congestive) heart failure: Secondary | ICD-10-CM | POA: Diagnosis not present

## 2022-03-05 DIAGNOSIS — E1122 Type 2 diabetes mellitus with diabetic chronic kidney disease: Secondary | ICD-10-CM | POA: Diagnosis present

## 2022-03-05 DIAGNOSIS — Z809 Family history of malignant neoplasm, unspecified: Secondary | ICD-10-CM

## 2022-03-05 DIAGNOSIS — E11649 Type 2 diabetes mellitus with hypoglycemia without coma: Secondary | ICD-10-CM | POA: Diagnosis not present

## 2022-03-05 DIAGNOSIS — I152 Hypertension secondary to endocrine disorders: Secondary | ICD-10-CM | POA: Diagnosis not present

## 2022-03-05 DIAGNOSIS — A419 Sepsis, unspecified organism: Principal | ICD-10-CM | POA: Diagnosis present

## 2022-03-05 DIAGNOSIS — Z79899 Other long term (current) drug therapy: Secondary | ICD-10-CM

## 2022-03-05 DIAGNOSIS — E785 Hyperlipidemia, unspecified: Secondary | ICD-10-CM

## 2022-03-05 DIAGNOSIS — L03116 Cellulitis of left lower limb: Secondary | ICD-10-CM | POA: Diagnosis present

## 2022-03-05 DIAGNOSIS — I272 Pulmonary hypertension, unspecified: Secondary | ICD-10-CM | POA: Diagnosis not present

## 2022-03-05 DIAGNOSIS — L0390 Cellulitis, unspecified: Secondary | ICD-10-CM | POA: Diagnosis not present

## 2022-03-05 DIAGNOSIS — G4733 Obstructive sleep apnea (adult) (pediatric): Secondary | ICD-10-CM | POA: Diagnosis not present

## 2022-03-05 DIAGNOSIS — I081 Rheumatic disorders of both mitral and tricuspid valves: Secondary | ICD-10-CM | POA: Diagnosis present

## 2022-03-05 DIAGNOSIS — E669 Obesity, unspecified: Secondary | ICD-10-CM | POA: Diagnosis not present

## 2022-03-05 DIAGNOSIS — Z833 Family history of diabetes mellitus: Secondary | ICD-10-CM

## 2022-03-05 DIAGNOSIS — E1169 Type 2 diabetes mellitus with other specified complication: Secondary | ICD-10-CM | POA: Diagnosis present

## 2022-03-05 DIAGNOSIS — Z811 Family history of alcohol abuse and dependence: Secondary | ICD-10-CM

## 2022-03-05 DIAGNOSIS — I130 Hypertensive heart and chronic kidney disease with heart failure and stage 1 through stage 4 chronic kidney disease, or unspecified chronic kidney disease: Secondary | ICD-10-CM | POA: Diagnosis not present

## 2022-03-05 DIAGNOSIS — Z6833 Body mass index (BMI) 33.0-33.9, adult: Secondary | ICD-10-CM | POA: Diagnosis not present

## 2022-03-05 DIAGNOSIS — E1165 Type 2 diabetes mellitus with hyperglycemia: Secondary | ICD-10-CM | POA: Diagnosis present

## 2022-03-05 DIAGNOSIS — E1159 Type 2 diabetes mellitus with other circulatory complications: Secondary | ICD-10-CM | POA: Diagnosis present

## 2022-03-05 DIAGNOSIS — E7800 Pure hypercholesterolemia, unspecified: Secondary | ICD-10-CM | POA: Diagnosis not present

## 2022-03-05 DIAGNOSIS — N1831 Chronic kidney disease, stage 3a: Secondary | ICD-10-CM | POA: Diagnosis present

## 2022-03-05 DIAGNOSIS — I340 Nonrheumatic mitral (valve) insufficiency: Secondary | ICD-10-CM

## 2022-03-05 DIAGNOSIS — R6520 Severe sepsis without septic shock: Secondary | ICD-10-CM | POA: Diagnosis present

## 2022-03-05 DIAGNOSIS — N179 Acute kidney failure, unspecified: Secondary | ICD-10-CM | POA: Diagnosis not present

## 2022-03-05 DIAGNOSIS — Z794 Long term (current) use of insulin: Secondary | ICD-10-CM | POA: Diagnosis not present

## 2022-03-05 DIAGNOSIS — I429 Cardiomyopathy, unspecified: Secondary | ICD-10-CM | POA: Diagnosis not present

## 2022-03-05 DIAGNOSIS — Z6834 Body mass index (BMI) 34.0-34.9, adult: Secondary | ICD-10-CM

## 2022-03-05 DIAGNOSIS — I252 Old myocardial infarction: Secondary | ICD-10-CM | POA: Diagnosis not present

## 2022-03-05 DIAGNOSIS — I5022 Chronic systolic (congestive) heart failure: Secondary | ICD-10-CM

## 2022-03-05 DIAGNOSIS — Z20822 Contact with and (suspected) exposure to covid-19: Secondary | ICD-10-CM | POA: Diagnosis present

## 2022-03-05 DIAGNOSIS — Z823 Family history of stroke: Secondary | ICD-10-CM

## 2022-03-05 DIAGNOSIS — Z8249 Family history of ischemic heart disease and other diseases of the circulatory system: Secondary | ICD-10-CM

## 2022-03-05 DIAGNOSIS — N200 Calculus of kidney: Secondary | ICD-10-CM | POA: Diagnosis present

## 2022-03-05 DIAGNOSIS — R9430 Abnormal result of cardiovascular function study, unspecified: Secondary | ICD-10-CM

## 2022-03-05 DIAGNOSIS — M7989 Other specified soft tissue disorders: Secondary | ICD-10-CM | POA: Diagnosis present

## 2022-03-05 DIAGNOSIS — I071 Rheumatic tricuspid insufficiency: Secondary | ICD-10-CM

## 2022-03-05 HISTORY — DX: Heart failure, unspecified: I50.9

## 2022-03-05 LAB — URINALYSIS, ROUTINE W REFLEX MICROSCOPIC
Bilirubin Urine: NEGATIVE
Glucose, UA: 500 mg/dL — AB
Ketones, ur: NEGATIVE mg/dL
Leukocytes,Ua: NEGATIVE
Nitrite: NEGATIVE
Protein, ur: 30 mg/dL — AB
Specific Gravity, Urine: 1.007 (ref 1.005–1.030)
pH: 5 (ref 5.0–8.0)

## 2022-03-05 LAB — BRAIN NATRIURETIC PEPTIDE: B Natriuretic Peptide: 2958.3 pg/mL — ABNORMAL HIGH (ref 0.0–100.0)

## 2022-03-05 LAB — CBC
HCT: 37.5 % — ABNORMAL LOW (ref 39.0–52.0)
Hemoglobin: 11.6 g/dL — ABNORMAL LOW (ref 13.0–17.0)
MCH: 21.8 pg — ABNORMAL LOW (ref 26.0–34.0)
MCHC: 30.9 g/dL (ref 30.0–36.0)
MCV: 70.6 fL — ABNORMAL LOW (ref 80.0–100.0)
Platelets: 378 10*3/uL (ref 150–400)
RBC: 5.31 MIL/uL (ref 4.22–5.81)
RDW: 20.7 % — ABNORMAL HIGH (ref 11.5–15.5)
WBC: 30.7 10*3/uL — ABNORMAL HIGH (ref 4.0–10.5)
nRBC: 0 % (ref 0.0–0.2)

## 2022-03-05 LAB — DIFFERENTIAL
Abs Immature Granulocytes: 0.2 10*3/uL — ABNORMAL HIGH (ref 0.00–0.07)
Basophils Absolute: 0.1 10*3/uL (ref 0.0–0.1)
Basophils Relative: 0 %
Eosinophils Absolute: 0 10*3/uL (ref 0.0–0.5)
Eosinophils Relative: 0 %
Immature Granulocytes: 1 %
Lymphocytes Relative: 9 %
Lymphs Abs: 2.2 10*3/uL (ref 0.7–4.0)
Monocytes Absolute: 1.8 10*3/uL — ABNORMAL HIGH (ref 0.1–1.0)
Monocytes Relative: 7 %
Neutro Abs: 20.7 10*3/uL — ABNORMAL HIGH (ref 1.7–7.7)
Neutrophils Relative %: 83 %
Other: ADEQUATE %

## 2022-03-05 LAB — BASIC METABOLIC PANEL
Anion gap: 11 (ref 5–15)
BUN: 68 mg/dL — ABNORMAL HIGH (ref 6–20)
CO2: 18 mmol/L — ABNORMAL LOW (ref 22–32)
Calcium: 9.1 mg/dL (ref 8.9–10.3)
Chloride: 108 mmol/L (ref 98–111)
Creatinine, Ser: 2.49 mg/dL — ABNORMAL HIGH (ref 0.61–1.24)
GFR, Estimated: 34 mL/min — ABNORMAL LOW (ref >60)
Glucose, Bld: 144 mg/dL — ABNORMAL HIGH (ref 70–99)
Potassium: 4.7 mmol/L (ref 3.5–5.1)
Sodium: 137 mmol/L (ref 135–145)

## 2022-03-05 LAB — HEPATIC FUNCTION PANEL
ALT: 35 U/L (ref 0–44)
AST: 37 U/L (ref 15–41)
Albumin: 3.4 g/dL — ABNORMAL LOW (ref 3.5–5.0)
Alkaline Phosphatase: 65 U/L (ref 38–126)
Bilirubin, Direct: 0.2 mg/dL (ref 0.0–0.2)
Indirect Bilirubin: 0.6 mg/dL (ref 0.3–0.9)
Total Bilirubin: 0.8 mg/dL (ref 0.3–1.2)
Total Protein: 6.4 g/dL — ABNORMAL LOW (ref 6.5–8.1)

## 2022-03-05 LAB — LACTIC ACID, PLASMA
Lactic Acid, Venous: 1.9 mmol/L (ref 0.5–1.9)
Lactic Acid, Venous: 1.8 mmol/L (ref 0.5–1.9)

## 2022-03-05 LAB — RESP PANEL BY RT-PCR (FLU A&B, COVID) ARPGX2
Influenza A by PCR: NEGATIVE
Influenza B by PCR: NEGATIVE
SARS Coronavirus 2 by RT PCR: NEGATIVE

## 2022-03-05 LAB — PROTIME-INR
INR: 1.3 — ABNORMAL HIGH (ref 0.8–1.2)
Prothrombin Time: 15.6 seconds — ABNORMAL HIGH (ref 11.4–15.2)

## 2022-03-05 LAB — GLUCOSE, CAPILLARY: Glucose-Capillary: 198 mg/dL — ABNORMAL HIGH (ref 70–99)

## 2022-03-05 LAB — CK: Total CK: 402 U/L — ABNORMAL HIGH (ref 49–397)

## 2022-03-05 MED ORDER — VANCOMYCIN HCL 10 G IV SOLR
2500.0000 mg | Freq: Once | INTRAVENOUS | Status: AC
  Administered 2022-03-05: 2500 mg via INTRAVENOUS
  Filled 2022-03-05: qty 2500

## 2022-03-05 MED ORDER — PIPERACILLIN-TAZOBACTAM 3.375 G IVPB
3.3750 g | Freq: Three times a day (TID) | INTRAVENOUS | Status: DC
  Administered 2022-03-05 – 2022-03-06 (×2): 3.375 g via INTRAVENOUS
  Filled 2022-03-05 (×3): qty 50

## 2022-03-05 MED ORDER — METOPROLOL TARTRATE 5 MG/5ML IV SOLN
5.0000 mg | Freq: Once | INTRAVENOUS | Status: AC
  Administered 2022-03-05: 5 mg via INTRAVENOUS
  Filled 2022-03-05: qty 5

## 2022-03-05 MED ORDER — VANCOMYCIN VARIABLE DOSE PER UNSTABLE RENAL FUNCTION (PHARMACIST DOSING): Status: DC

## 2022-03-05 MED ORDER — HEPARIN SODIUM (PORCINE) 5000 UNIT/ML IJ SOLN
5000.0000 [IU] | Freq: Three times a day (TID) | INTRAMUSCULAR | Status: DC
  Administered 2022-03-05 – 2022-03-09 (×9): 5000 [IU] via SUBCUTANEOUS
  Filled 2022-03-05 (×10): qty 1

## 2022-03-05 MED ORDER — INSULIN ASPART 100 UNIT/ML IJ SOLN
0.0000 [IU] | Freq: Three times a day (TID) | INTRAMUSCULAR | Status: DC
  Administered 2022-03-08: 3 [IU] via SUBCUTANEOUS

## 2022-03-05 MED ORDER — CEFAZOLIN SODIUM-DEXTROSE 1-4 GM/50ML-% IV SOLN
1.0000 g | Freq: Once | INTRAVENOUS | Status: AC
  Administered 2022-03-05: 1 g via INTRAVENOUS
  Filled 2022-03-05: qty 50

## 2022-03-05 MED ORDER — SODIUM CHLORIDE 0.9 % IV SOLN: INTRAVENOUS | Status: DC | PRN

## 2022-03-05 MED ORDER — ACETAMINOPHEN 650 MG RE SUPP: 650.0000 mg | Freq: Four times a day (QID) | RECTAL | Status: DC | PRN

## 2022-03-05 MED ORDER — DAPAGLIFLOZIN PROPANEDIOL 10 MG PO TABS
10.0000 mg | ORAL_TABLET | Freq: Every day | ORAL | Status: DC
  Administered 2022-03-06 – 2022-03-07 (×2): 10 mg via ORAL
  Filled 2022-03-05 (×3): qty 1

## 2022-03-05 MED ORDER — ACETAMINOPHEN 325 MG PO TABS
650.0000 mg | ORAL_TABLET | Freq: Four times a day (QID) | ORAL | Status: DC | PRN
  Filled 2022-03-05: qty 2

## 2022-03-05 MED ORDER — FUROSEMIDE 10 MG/ML IJ SOLN
40.0000 mg | Freq: Two times a day (BID) | INTRAMUSCULAR | Status: DC
  Administered 2022-03-05 – 2022-03-06 (×2): 40 mg via INTRAVENOUS
  Filled 2022-03-05 (×2): qty 4

## 2022-03-05 MED ORDER — ATORVASTATIN CALCIUM 10 MG PO TABS
20.0000 mg | ORAL_TABLET | Freq: Every day | ORAL | Status: DC
  Administered 2022-03-06 – 2022-03-09 (×4): 20 mg via ORAL
  Filled 2022-03-05 (×4): qty 2

## 2022-03-05 MED ORDER — HYDROCODONE-ACETAMINOPHEN 5-325 MG PO TABS: 1.0000 | ORAL_TABLET | ORAL | Status: DC | PRN

## 2022-03-05 MED ORDER — CARVEDILOL 12.5 MG PO TABS
12.5000 mg | ORAL_TABLET | Freq: Two times a day (BID) | ORAL | Status: DC
Start: 2022-03-06 — End: 2022-03-07
  Administered 2022-03-06 – 2022-03-07 (×3): 12.5 mg via ORAL
  Filled 2022-03-05 (×3): qty 1

## 2022-03-05 MED ORDER — INSULIN ASPART 100 UNIT/ML IJ SOLN: 0.0000 [IU] | Freq: Every day | INTRAMUSCULAR | Status: DC

## 2022-03-05 MED ORDER — IOHEXOL 300 MG/ML  SOLN
100.0000 mL | Freq: Once | INTRAMUSCULAR | Status: AC | PRN
  Administered 2022-03-05: 80 mL via INTRAVENOUS

## 2022-03-05 MED ORDER — INSULIN GLARGINE-YFGN 100 UNIT/ML ~~LOC~~ SOLN
50.0000 [IU] | Freq: Every day | SUBCUTANEOUS | Status: DC
Start: 2022-03-05 — End: 2022-03-06
  Administered 2022-03-05: 50 [IU] via SUBCUTANEOUS
  Filled 2022-03-05 (×2): qty 0.5

## 2022-03-05 NOTE — Progress Notes (Signed)
Pharmacy Antibiotic Note ? ?Jeffery Villa [place] is a 36 y.o. male admitted on 03/05/2022 with sepsis.  Pharmacy has been consulted for vancomycin dosing. ?-WBC= 30.7, tmax= 100.1 ?-SCr= 2.49 (1.99 in 07/2021) ? ?Plan: ?-Vancomycin 2500mg  IV x1 ?-BMET in am to determine maintenance dosing ?-Will follow renal function, cultures and clinical progress ? ? ?Height: 6\' 2"  (188 cm) ?Weight: 122.5 kg (270 lb) ?IBW/kg (Calculated) : 82.2 ? ?Temp (24hrs), Avg:99 ?F (37.2 ?C), Min:98.4 ?F (36.9 ?C), Max:100.1 ?F (37.8 ?C) ? ?Recent Labs  ?Lab 03/05/22 ?1249 03/05/22 ?1443 03/05/22 ?1625  ?WBC 30.7*  --   --   ?CREATININE 2.49*  --   --   ?LATICACIDVEN  --  1.9 1.8  ?  ?Estimated Creatinine Clearance: 58.1 mL/min (A) (by C-G formula based on SCr of 2.49 mg/dL (H)).   ? ?No Known Allergies ? ?Antimicrobials this admission: ?5/12 zosyn ?5/12 vanc ? ?Dose adjustments this admission: ? ? ?Microbiology results: ?5/12 blood x1  ? ?Thank you for allowing pharmacy to be a part of this patient?s care. ? ? ?Hildred Laser, PharmD ?Clinical Pharmacist ?**Pharmacist phone directory can now be found on [email or web address] ([email or web address]).  Listed under [place]. ? ? ?

## 2022-03-05 NOTE — ED Notes (Signed)
Pt called X 1 for room. No answer. Will call again soon. ?

## 2022-03-05 NOTE — Assessment & Plan Note (Signed)
Will need update echo.  May need to see structural heart team. ? ?

## 2022-03-05 NOTE — Assessment & Plan Note (Signed)
Continue with Lantus.  Add sliding scale.  Continue Farxiga. ?

## 2022-03-05 NOTE — Assessment & Plan Note (Addendum)
Patient has a reported left ventricular ejection fraction of 20 to 25% based on echo back in September 2022.  He follows with [facility] cardiology.  Patient is not on guideline directed medical therapy.  Not sure why he has not been referred to advanced heart failure team as he has severely reduced LVEF and he is a very young age of 75.  Patient would benefit from seeing advanced heart failure team or is here.  We will update his echo.  Hold his ARB to give his blood pressure so more room for diuresis.  Patient would likely benefit from starting [facility].  Patient does not have any trouble affording prescriptions.  In addition, given his extremely low ejection fraction, he would be a candidate for ICD placement.  Defer this to cardiology consultation. ? ?Patient does have any pulmonary edema on chest x-ray.  His BNP is elevated but we do not have a baseline.  He does describe PND.  We will need to start diuresis with 40 mg of Lasix IV every 12 hours.  He states that his weight is up about 18 pounds.  Given the amount of edema in his legs, I estimate he is about 20 pounds over his dry weight. ?

## 2022-03-05 NOTE — Assessment & Plan Note (Signed)
Baseline creatinine approximately 2.2.  Likely needs extra diuresis.  Check renal ultrasound.  May need to establish with nephrology. ?

## 2022-03-05 NOTE — Assessment & Plan Note (Signed)
Will need update echo.  May need to see structural heart team. ? ?

## 2022-03-05 NOTE — ED Triage Notes (Signed)
Pt c/o L leg swelling, pain, and bruise on calf. Pt with hx of CHF. Pt sent to ED by PCP to r/o DVT.   ?

## 2022-03-05 NOTE — ED Provider Notes (Signed)
?[facility] EMERGENC[facility] DEPT ?Provider Note ? ? ?CSN: [id number] ?Arrival date & time: 03/05/22  1134 ? ?  ? ?History ? ?Chief Complaint  ?Patient presents with  ? Leg Swelling  ? ? ?Jeffery Villa is a 36 y.o. male who presents to the emergency department for leg swelling.  Patient has history of CHF and diabetes, saw his primary doctor to today and there was concern for a left lower leg DVT.  Patient was sent by his PCP to rule out a DVT.  He states he has had bilateral leg swelling and dyspnea on exertion, that he associated with his CHF.  Denies fever. ? ?HPI ? ?  ? ?Home Medications ?Prior to Admission medications   ?Medication Sig Start Date End Date Taking? Authorizing Provider  ?acetaminophen (TYLENOL) 325 MG tablet Take 650 mg by mouth every 6 (six) hours as needed for mild pain. Reported on 10/09/2015    [provider]  ?amLODipine-benazepril (LOTREL) 10-40 MG per capsule Take 1 capsule by mouth daily. ?Patient not taking: Reported on 10/09/2015 09/18/14   Verlee Monte, MD  ?atorvastatin (LIPITOR) 20 MG tablet Take 1 tablet (20 mg total) by mouth daily. 09/18/14   Verlee Monte, MD  ?doxycycline (VIBRA-TABS) 100 MG tablet Take 1 tablet (100 mg total) by mouth 2 (two) times daily. 07/14/21   Persons, Bevely Palmer, PA  ?glucose monitoring kit (FREESTYLE) monitoring kit 1 each by Does not apply route as needed for other. ?Patient not taking: Reported on 10/09/2015 09/18/14   Verlee Monte, MD  ?Insulin Glargine (LANTUS SOLOSTAR) 100 UNIT/ML Solostar Pen Inject 60 Units into the skin daily at 10 pm. 09/18/14   Verlee Monte, MD  ?Insulin Pen Needle 30G X 5 MM MISC New needle with every insulin adminsration 09/18/14   Verlee Monte, MD  ?metFORMIN (GLUCOPHAGE) 1000 MG tablet Take 1 tablet (1,000 mg total) by mouth 2 (two) times daily with a meal. 09/18/14   Verlee Monte, MD  ?mupirocin ointment (BACTROBAN) 2 % Apply 1 application topically 2 (two) times daily. Apply to the affected area 2  times a day 07/14/21   Persons, Bevely Palmer, [place]  ?   ? ?Allergies    ?Patient has no known allergies.   ? ?Review of Systems   ?Review of Systems  ?Constitutional:  Positive for chills. Negative for fever.  ?HENT:  Negative for congestion.   ?Respiratory:  Positive for shortness of breath. Negative for cough.   ?     Dyspnea on exertion  ?Cardiovascular:  Positive for leg swelling. Negative for chest pain and palpitations.  ?Gastrointestinal:  Negative for abdominal pain, diarrhea, nausea and vomiting.  ?Skin:  Positive for color change.  ?All other systems reviewed and are negative. ? ?Physical Exam ?Updated Vital Signs ?BP 129/87   Pulse (!) 106   Temp 98.7 ?F (37.1 ?C) (Oral)   Resp (!) 22   Ht '6\' 2"'  (1.88 m)   Wt 122.5 kg   SpO2 100%   BMI 34.67 kg/m?  ?Physical Exam ?Vitals and nursing note reviewed.  ?Constitutional:   ?   Appearance: Normal appearance. He is ill-appearing.  ?   Comments: Rigors  ?HENT:  ?   Head: Normocephalic and atraumatic.  ?Eyes:  ?   Conjunctiva/sclera: Conjunctivae normal.  ?Cardiovascular:  ?   Rate and Rhythm: Normal rate and regular rhythm.  ?Pulmonary:  ?   Effort: Pulmonary effort is normal. No respiratory distress.  ?   Breath sounds: Normal breath sounds.  ?  Abdominal:  ?   General: There is no distension.  ?   Palpations: Abdomen is soft.  ?   Tenderness: There is no abdominal tenderness.  ?Musculoskeletal:  ?   Right lower leg: 1+ Pitting Edema present.  ?   Left lower leg: 1+ Pitting Edema present.  ?Skin: ?   General: Skin is warm and dry.  ?   Comments: Images as seen below with left leg generalized redness, ecchymoses over lateral calf, and previous R foot diabetic ulcer  ?Neurological:  ?   General: No focal deficit present.  ?   Mental Status: He is alert.  ? ? ?Left leg redness compared to right ? ? ?Left leg bruise, prompted concern from PCP ? ? ?Previous R foot diabetic ulcer ? ?ED Results / Procedures / Treatments   ?Labs ?(all labs ordered are listed, but only  abnormal results are displayed) ?Labs Reviewed  ?BASIC METABOLIC PANEL - Abnormal; Notable for the following components:  ?    Result Value  ? CO2 18 (*)   ? Glucose, Bld 144 (*)   ? BUN 68 (*)   ? Creatinine, Ser 2.49 (*)   ? GFR, Estimated 34 (*)   ? All other components within normal limits  ?CBC - Abnormal; Notable for the following components:  ? WBC 30.7 (*)   ? Hemoglobin 11.6 (*)   ? HCT 37.5 (*)   ? MCV 70.6 (*)   ? MCH 21.8 (*)   ? RDW 20.7 (*)   ? All other components within normal limits  ?BRAIN NATRIURETIC PEPTIDE - Abnormal; Notable for the following components:  ? B Natriuretic Peptide 2,958.3 (*)   ? All other components within normal limits  ?PROTIME-INR - Abnormal; Notable for the following components:  ? Prothrombin Time 15.6 (*)   ? INR 1.3 (*)   ? All other components within normal limits  ?URINALYSIS, ROUTINE W REFLEX MICROSCOPIC - Abnormal; Notable for the following components:  ? Color, Urine COLORLESS (*)   ? Glucose, UA 500 (*)   ? Hgb urine dipstick SMALL (*)   ? Protein, ur 30 (*)   ? All other components within normal limits  ?HEPATIC FUNCTION PANEL - Abnormal; Notable for the following components:  ? Total Protein 6.4 (*)   ? Albumin 3.4 (*)   ? All other components within normal limits  ?CK - Abnormal; Notable for the following components:  ? Total CK 402 (*)   ? All other components within normal limits  ?CULTURE, BLOOD (ROUTINE X 2)  ?CULTURE, BLOOD (ROUTINE X 2)  ?RESP PANEL BY RT-PCR (FLU A&B, COVID) [id number]  ?LACTIC ACID, PLASMA  ?LACTIC ACID, PLASMA  ?DIFFERENTIAL  ? ? ?EKG ?EKG Interpretation ? ?Date/Time:  Friday Mar 05 2022 12:47:58 EDT ?Ventricular Rate:  92 ?PR Interval:  178 ?QRS Duration: 88 ?QT Interval:  352 ?QTC Calculation: 435 ?R Axis:   56 ?Text Interpretation: Normal sinus rhythm Anterior infarct , age undetermined ST & T wave abnormality, consider inferolateral ischemia Abnormal ECG When compared with ECG of 25-Sep-2014 09:22, Minimal criteria for Inferior infarct  are no longer Present Inverted T waves have replaced nonspecific T wave abnormality in Lateral leads Confirmed by Aletta Edouard [phone]) on 03/05/2022 12:52:51 PM ? ?Radiology ?DG Chest 2 View ? ?Result Date: 03/05/2022 ?CLINICAL DATA:  Left leg swelling and pain.  History of CHF. EXAM: CHEST - 2 VIEW COMPARISON:  None FINDINGS: Heart size and mediastinal contour scratch set heart size is enlarged.  No signs of pleural effusion or edema. No airspace opacities identified. Faint nodular density within the projection of the right upper lobe may reflect something external to the patient. Visualized osseous structures are unremarkable. IMPRESSION: 1. Cardiac enlargement. 2. No heart failure. 3. Faint nodular density within the projection of the right upper lobe may reflect something external to the patient. Correlation with physical exam findings. In the absence of any external device over the right upper chest consider further evaluation with dedicated CT of the chest. Electronically Signed   By: Kerby Moors M.D.   On: 03/05/2022 13:11  ? ?CT ABDOMEN PELVIS W CONTRAST ? ?Result Date: 03/05/2022 ?CLINICAL DATA:  Sepsis. EXAM: CT ABDOMEN AND PELVIS WITH CONTRAST TECHNIQUE: Multidetector CT imaging of the abdomen and pelvis was performed using the standard protocol following bolus administration of intravenous contrast. RADIATION DOSE REDUCTION: This exam was performed according to the departmental dose-optimization program which includes automated exposure control, adjustment of the mA and/or kV according to patient size and/or use of iterative reconstruction technique. CONTRAST:  110m OMNIPAQUE IOHEXOL 300 MG/ML  SOLN COMPARISON:  None Available. FINDINGS: Lower chest: Lung bases are clear.  Mild cardiomegaly. Hepatobiliary: Liver, gallbladder and biliary tree are normal. Pancreas: Normal. Spleen: Normal. Adrenals/Urinary Tract: Adrenal glands are normal. Kidneys are normal in size. 2 mm stone over the lower pole left  kidney. No significant hydronephrosis. Minimal symmetric perinephric fluid. Ureters and bladder are normal. Stomach/Bowel: Stomach and small bowel are normal. Appendix is normal. Colon is normal. Vascular/Lymphat

## 2022-03-05 NOTE — Subjective & Objective (Signed)
Chief complaint: Left leg pain, bilateral leg swelling, ?History of present illness: ?36 year old African-American male history of type 2 diabetes, hypertension, hyperlipidemia, CKD stage IIIa, chronic systolic heart failure with an EF of less than 25% who presents to the ER today with a overnight history of left leg pain.  He has had about 3 to 4-week history of increasing bilateral lower extremity edema.  He has gained about 20 pounds in the last 2 to 3 weeks.  He is not following a fluid restriction at home.  He drinks upwards of 64 to 100 ounces of water/liquids a day. ? ?He states last night he started having focal pain in the lateral aspect of his left calf.  This started out as a small red patch.  He was able to go to bed.  He woke up this morning.  He try to go the bathroom.  He put weight on his left leg, he had extreme pain in his left calf.  He was concerned that he may have had a DVT went to the ER. ? ?Lower extremity ultrasound were negative for DVT.  He had elevated white count of almost 30,000.  He was started on IV antibiotics.  He was transferred to the hospital for further care. ? ?Of note, the patient has complained of PND for the last 3 to 4 weeks.  He states that he goes to bed lays down flat and wake up gasping for air.  This is corroborated with his wife. ? ?Chest x-ray the ER demonstrated no pulmonary edema.  He had cardiomegaly. ? ?CT abdomen pelvis was negative for acute abdominal findings.  He had a nonobstructing left renal stone. ?

## 2022-03-05 NOTE — Assessment & Plan Note (Signed)
Chronic.  Patient encouraged to lose weight. ?

## 2022-03-05 NOTE — Assessment & Plan Note (Signed)
Admit to med telemetry bed.  Continue antibiotics with Zosyn and vancomycin.  Check procalcitonin level in the morning. ? ? ? ? ? ? ?

## 2022-03-05 NOTE — Assessment & Plan Note (Signed)
Continue statin. 

## 2022-03-05 NOTE — Assessment & Plan Note (Signed)
We will need to update echo.  Patient has a cardiologist at [facility]. ?

## 2022-03-05 NOTE — Progress Notes (Signed)
Pharmacy Antibiotic Note ? ?Jeffery Villa is a 35 y.o. male admitted on 03/05/2022 with sepsis.  Pharmacy has been consulted for Zosyn dosing. ? ?Plan: ?Zosyn 3.375g IV q8h (4 hour infusion). ?Follow-up clinical status, cultures, LOT, de-escalate as able.  ? ?Height: 6\' 2"  (188 cm) ?Weight: 122.5 kg (270 lb) ?IBW/kg (Calculated) : 82.2 ? ?Temp (24hrs), Avg:98.6 ?F (37 ?C), Min:98.4 ?F (36.9 ?C), Max:98.7 ?F (37.1 ?C) ? ?Recent Labs  ?Lab 03/05/22 ?1249 03/05/22 ?1443 03/05/22 ?1625  ?WBC 30.7*  --   --   ?CREATININE 2.49*  --   --   ?LATICACIDVEN  --  1.9 1.8  ?  ?Estimated Creatinine Clearance: 58.1 mL/min (A) (by C-G formula based on SCr of 2.49 mg/dL (H)).   ? ?No Known Allergies ? ?Antimicrobials this admission: ?Zosyn 5/12 >>  ?Cefazolin 5/12  ? ?Microbiology results: ?5/12 BCx: pending ? ? ?Thank you for allowing pharmacy to be a part of this patient?s care. ? ?Vance Peper, PharmD ?PGY1 Pharmacy Resident ?03/05/2022 5:33 PM  ? ?Please check AMION for all [facility] phone numbers ?After 10:00 PM, call [facility] [phone] ? ? ?

## 2022-03-05 NOTE — H&P (Signed)
?History and Physical  ? ? ?Jeffery Villa SWN:[id number] DOB: 1986-09-10 DOA: 03/05/2022 ? ?DOS: the patient was seen and examined on 03/05/2022 ? ?PCP: Patient, [name]  ? ?Patient coming from: Home ? ?I have personally briefly reviewed patient's old medical records in [facility] ? ?Chief complaint: Left leg pain, bilateral leg swelling, ?History of present illness: ?36 year old African-American male history of type 2 diabetes, hypertension, hyperlipidemia, CKD stage IIIa, chronic systolic heart failure with an EF of less than 25% who presents to the ER today with a overnight history of left leg pain.  He has had about 3 to 4-week history of increasing bilateral lower extremity edema.  He has gained about 20 pounds in the last 2 to 3 weeks.  He is not following a fluid restriction at home.  He drinks upwards of 64 to 100 ounces of water/liquids a day. ? ?He states last night he started having focal pain in the lateral aspect of his left calf.  This started out as a small red patch.  He was able to go to bed.  He woke up this morning.  He try to go the bathroom.  He put weight on his left leg, he had extreme pain in his left calf.  He was concerned that he may have had a DVT went to the ER. ? ?Lower extremity ultrasound were negative for DVT.  He had elevated white count of almost 30,000.  He was started on IV antibiotics.  He was transferred to the hospital for further care. ? ?Of note, the patient has complained of PND for the last 3 to 4 weeks.  He states that he goes to bed lays down flat and wake up gasping for air.  This is corroborated with his wife. ? ?Chest x-ray the ER demonstrated no pulmonary edema.  He had cardiomegaly. ? ?CT abdomen pelvis was negative for acute abdominal findings.  He had a nonobstructing left renal stone.  ? ?ED Course: IV abx started. Blood cx obtained. ? ?Review of Systems:  ?Review of Systems  ?Constitutional:  Positive for chills and malaise/fatigue.  ?HENT:  Negative.    ?Eyes: Negative.   ?Respiratory:  Positive for cough and shortness of breath.   ?Cardiovascular:  Positive for leg swelling and PND.  ?Gastrointestinal: Negative.   ?Genitourinary: Negative.   ?Musculoskeletal: Negative.   ?Skin:   ?     Erythema on left leg ?3-4 weeks of increased bilateral leg edema  ?Neurological: Negative.   ?Endo/Heme/Allergies: Negative.   ?Psychiatric/Behavioral: Negative.    ?All other systems reviewed and are negative. ? ?Past Medical History:  ?Diagnosis Date  ? CHF (congestive heart failure) ([place])   ? Diabetes mellitus without complication ([place])   ? 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 High cholesterol   ? Hypertension   ? ? ?Past Surgical History:  ?Procedure Laterality Date  ? 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 NO PAST SURGERIES    ? ? ? reports that he has never smoked. He does not have any smokeless tobacco history on file. He reports that he does not drink alcohol and does not use drugs. ? ?No Known Allergies ? ?Family History  ?Problem Relation Age of Onset  ? Diabetes Mother   ? Hypertension Mother   ? Stroke Mother   ?  Alcohol abuse Father   ? Cancer Maternal Grandmother   ? Diabetes Maternal Grandmother   ? Hypertension Maternal Grandmother   ? ? ?Prior to Admission medications   ?Medication Sig Start Date End Date Taking? Authorizing Provider  ?acetaminophen (TYLENOL) 325 MG tablet Take 650 mg by mouth every 6 (six) hours as needed for mild pain. Reported on 10/09/2015    [provider]  ?amLODipine-benazepril (LOTREL) 10-40 MG per capsule Take 1 capsule by mouth daily. ?Patient not taking: Reported on 10/09/2015 09/18/14   Verlee Monte, MD  ?atorvastatin (LIPITOR) 20 MG tablet Take 1 tablet (20 mg total) by mouth daily. 09/18/14   Verlee Monte, MD  ?doxycycline (VIBRA-TABS) 100 MG tablet Take 1  tablet (100 mg total) by mouth 2 (two) times daily. 07/14/21   Persons, Bevely Palmer, PA  ?glucose monitoring kit (FREESTYLE) monitoring kit 1 each by Does not apply route as needed for other. ?Patient not taking: Reported on 10/09/2015 09/18/14   Verlee Monte, MD  ?Insulin Glargine (LANTUS SOLOSTAR) 100 UNIT/ML Solostar Pen Inject 60 Units into the skin daily at 10 pm. 09/18/14   Verlee Monte, MD  ?Insulin Pen Needle 30G X 5 MM MISC New needle with every insulin adminsration 09/18/14   Verlee Monte, MD  ?metFORMIN (GLUCOPHAGE) 1000 MG tablet Take 1 tablet (1,000 mg total) by mouth 2 (two) times daily with a meal. 09/18/14   Verlee Monte, MD  ?mupirocin ointment (BACTROBAN) 2 % Apply 1 application topically 2 (two) times daily. Apply to the affected area 2 times a day 07/14/21   Persons, Bevely Palmer, [place]  ? ? ?Physical Exam: ?Vitals:  ? 03/05/22 1930 03/05/22 1945 03/05/22 2000 03/05/22 2150  ?BP: 126/84  (!) 125/92 114/76  ?Pulse: (!) 108 (!) 108    ?Resp: (!) 36 (!) 27 (!) 24   ?Temp:   100.1 ?F (37.8 ?C) 98.4 ?F (36.9 ?C)  ?TempSrc:      ?SpO2: 96% 98% 98% 98%  ?Weight:    122.2 kg  ?Height:    _0  (1.88 m)  ? ? ?Physical Exam ?Vitals and nursing note reviewed.  ?Constitutional:   ?   General: He is not in acute distress. ?   Appearance: Normal appearance. He is obese. He is not ill-appearing, toxic-appearing or diaphoretic.  ?HENT:  ?   Head: Normocephalic.  ?   Nose: Nose normal. No rhinorrhea.  ?Eyes:  ?   General: No scleral icterus. ?Cardiovascular:  ?   Rate and Rhythm: Regular rhythm. Tachycardia present.  ?   Heart sounds: Murmur heard.  ?Systolic murmur is present with a grade of 3/6.  ?Pulmonary:  ?   Effort: Pulmonary effort is normal. No respiratory distress.  ?   Breath sounds: Normal breath sounds. No wheezing.  ?Abdominal:  ?   General: Bowel sounds are normal. There is no distension.  ?   Tenderness: There is no abdominal tenderness. There is no guarding or rebound.  ?Musculoskeletal:  ?   Right  lower leg: 2+ Edema present.  ?   Left lower leg: 2+ Edema present.  ?   Right foot: Swelling present.  ?   Left foot: Swelling present.  ?   Comments: +2 pitting pedal, pretibial edema  ?Skin: ?   Findings: Erythema present.  ?   Comments: Small excoriated area on left foot 2nd toe. Pt states this looks better than normal. Was an open ulcer about 2-3 weeks ago.  ?Neurological:  ?   General: No  focal deficit present.  ?   Mental Status: He is alert and oriented to person, place, and time.  ?  ? ? ? ? ?Labs on Admission: I have personally reviewed following labs and imaging studies ? ?CBC: ?Recent Labs  ?Lab 03/05/22 ?1249  ?WBC 30.7*  ?NEUTROABS 20.7*  ?HGB 11.6*  ?HCT 37.5*  ?MCV 70.6*  ?PLT 378  ? ?Basic Metabolic Panel: ?Recent Labs  ?Lab 03/05/22 ?1249  ?NA 137  ?K 4.7  ?CL 108  ?CO2 18*  ?GLUCOSE 144*  ?BUN 68*  ?CREATININE 2.49*  ?CALCIUM 9.1  ? ?GFR: ?Estimated Creatinine Clearance: 58.1 mL/min (A) (by C-G formula based on SCr of 2.49 mg/dL (H)). ?Liver Function Tests: ?Recent Labs  ?Lab 03/05/22 ?1443  ?AST 37  ?ALT 35  ?ALKPHOS 65  ?BILITOT 0.8  ?PROT 6.4*  ?ALBUMIN 3.4*  ? ?No results for input(s): LIPASE, AMYLASE in the last 168 hours. ?No results for input(s): AMMONIA in the last 168 hours. ?Coagulation Profile: ?Recent Labs  ?Lab 03/05/22 ?1443  ?INR 1.3*  ? ?Cardiac Enzymes: ?Recent Labs  ?Lab 03/05/22 ?1443  ?CKTOTAL 402*  ? ?BNP (last 3 results) ?No results for input(s): PROBNP in the last 8760 hours. ?HbA1C: ?No results for input(s): HGBA1C in the last 72 hours. ?CBG: ?No results for input(s): GLUCAP in the last 168 hours. ?Lipid Profile: ?No results for input(s): CHOL, HDL, LDLCALC, TRIG, CHOLHDL, LDLDIRECT in the last 72 hours. ?Thyroid Function Tests: ?No results for input(s): TSH, T4TOTAL, FREET4, T3FREE, THYROIDAB in the last 72 hours. ?Anemia Panel: ?No results for input(s): VITAMINB12, FOLATE, FERRITIN, TIBC, IRON, RETICCTPCT in the last 72 hours. ?Urine analysis: ?   ?Component Value  Date/Time  ? COLORURINE COLORLESS (A) 03/05/2022 1443  ? APPEARANCEUR CLEAR 03/05/2022 1443  ? LABSPEC 1.007 03/05/2022 1443  ? PHURINE 5.0 03/05/2022 1443  ? GLUCOSEU 500 (A) 03/05/2022 1443  ? HGBUR SMALL (A)

## 2022-03-05 NOTE — Assessment & Plan Note (Signed)
Hold his ARB as he would likely benefit from [facility] given his advanced heart failure. ?

## 2022-03-05 NOTE — Assessment & Plan Note (Signed)
Baseline creatinine approximately 2.2.  Mildly exacerbated.  Order renal ultrasound.  May need nephrology consult to establish care. ?

## 2022-03-05 NOTE — ED Notes (Signed)
Unable to obtain 2nd blood culture with 2x attempts, abx started to prevent delay in care per protocol.  ?

## 2022-03-06 ENCOUNTER — Inpatient Hospital Stay (HOSPITAL_COMMUNITY): Payer: 59

## 2022-03-06 ENCOUNTER — Encounter (HOSPITAL_COMMUNITY): Payer: Self-pay | Admitting: Internal Medicine

## 2022-03-06 DIAGNOSIS — Z794 Long term (current) use of insulin: Secondary | ICD-10-CM | POA: Diagnosis not present

## 2022-03-06 DIAGNOSIS — E11649 Type 2 diabetes mellitus with hypoglycemia without coma: Secondary | ICD-10-CM | POA: Diagnosis not present

## 2022-03-06 DIAGNOSIS — I252 Old myocardial infarction: Secondary | ICD-10-CM | POA: Diagnosis not present

## 2022-03-06 DIAGNOSIS — E1165 Type 2 diabetes mellitus with hyperglycemia: Secondary | ICD-10-CM | POA: Diagnosis present

## 2022-03-06 DIAGNOSIS — L03116 Cellulitis of left lower limb: Secondary | ICD-10-CM | POA: Diagnosis present

## 2022-03-06 DIAGNOSIS — N200 Calculus of kidney: Secondary | ICD-10-CM | POA: Diagnosis present

## 2022-03-06 DIAGNOSIS — Z833 Family history of diabetes mellitus: Secondary | ICD-10-CM | POA: Diagnosis not present

## 2022-03-06 DIAGNOSIS — N1831 Chronic kidney disease, stage 3a: Secondary | ICD-10-CM | POA: Diagnosis present

## 2022-03-06 DIAGNOSIS — E1169 Type 2 diabetes mellitus with other specified complication: Secondary | ICD-10-CM | POA: Diagnosis present

## 2022-03-06 DIAGNOSIS — R6520 Severe sepsis without septic shock: Secondary | ICD-10-CM | POA: Diagnosis present

## 2022-03-06 DIAGNOSIS — E7800 Pure hypercholesterolemia, unspecified: Secondary | ICD-10-CM | POA: Diagnosis present

## 2022-03-06 DIAGNOSIS — I509 Heart failure, unspecified: Secondary | ICD-10-CM | POA: Diagnosis not present

## 2022-03-06 DIAGNOSIS — I5022 Chronic systolic (congestive) heart failure: Secondary | ICD-10-CM | POA: Diagnosis not present

## 2022-03-06 DIAGNOSIS — E669 Obesity, unspecified: Secondary | ICD-10-CM | POA: Diagnosis present

## 2022-03-06 DIAGNOSIS — G4733 Obstructive sleep apnea (adult) (pediatric): Secondary | ICD-10-CM | POA: Diagnosis present

## 2022-03-06 DIAGNOSIS — N179 Acute kidney failure, unspecified: Secondary | ICD-10-CM | POA: Diagnosis present

## 2022-03-06 DIAGNOSIS — I081 Rheumatic disorders of both mitral and tricuspid valves: Secondary | ICD-10-CM | POA: Diagnosis present

## 2022-03-06 DIAGNOSIS — E1122 Type 2 diabetes mellitus with diabetic chronic kidney disease: Secondary | ICD-10-CM | POA: Diagnosis present

## 2022-03-06 DIAGNOSIS — I5043 Acute on chronic combined systolic (congestive) and diastolic (congestive) heart failure: Secondary | ICD-10-CM

## 2022-03-06 DIAGNOSIS — M7989 Other specified soft tissue disorders: Secondary | ICD-10-CM | POA: Diagnosis present

## 2022-03-06 DIAGNOSIS — L0390 Cellulitis, unspecified: Secondary | ICD-10-CM | POA: Diagnosis not present

## 2022-03-06 DIAGNOSIS — Z6834 Body mass index (BMI) 34.0-34.9, adult: Secondary | ICD-10-CM | POA: Diagnosis not present

## 2022-03-06 DIAGNOSIS — A419 Sepsis, unspecified organism: Secondary | ICD-10-CM | POA: Diagnosis present

## 2022-03-06 DIAGNOSIS — I429 Cardiomyopathy, unspecified: Secondary | ICD-10-CM | POA: Diagnosis present

## 2022-03-06 DIAGNOSIS — I130 Hypertensive heart and chronic kidney disease with heart failure and stage 1 through stage 4 chronic kidney disease, or unspecified chronic kidney disease: Secondary | ICD-10-CM | POA: Diagnosis present

## 2022-03-06 DIAGNOSIS — Z20822 Contact with and (suspected) exposure to covid-19: Secondary | ICD-10-CM | POA: Diagnosis present

## 2022-03-06 DIAGNOSIS — I272 Pulmonary hypertension, unspecified: Secondary | ICD-10-CM | POA: Diagnosis present

## 2022-03-06 DIAGNOSIS — I152 Hypertension secondary to endocrine disorders: Secondary | ICD-10-CM | POA: Diagnosis present

## 2022-03-06 DIAGNOSIS — I340 Nonrheumatic mitral (valve) insufficiency: Secondary | ICD-10-CM | POA: Diagnosis not present

## 2022-03-06 LAB — ECHOCARDIOGRAM COMPLETE
AR max vel: 1.73 cm2
AV Area VTI: 1.71 cm2
AV Area mean vel: 1.58 cm2
AV Mean grad: 3 mmHg
AV Peak grad: 5.4 mmHg
Ao pk vel: 1.16 m/s
Calc EF: 26.7 %
Height: 74 in
MV M vel: 3.78 m/s
MV Peak grad: 57.2 mmHg
Radius: 1.1 cm
S' Lateral: 5.5 cm
Single Plane A2C EF: 29.1 %
Single Plane A4C EF: 26.7 %
Weight: 4308.8 oz

## 2022-03-06 LAB — CBC WITH DIFFERENTIAL/PLATELET
Abs Immature Granulocytes: 0 10*3/uL (ref 0.00–0.07)
Basophils Absolute: 0 10*3/uL (ref 0.0–0.1)
Basophils Relative: 0 %
Eosinophils Absolute: 0 10*3/uL (ref 0.0–0.5)
Eosinophils Relative: 0 %
HCT: 36.1 % — ABNORMAL LOW (ref 39.0–52.0)
Hemoglobin: 11.3 g/dL — ABNORMAL LOW (ref 13.0–17.0)
Lymphocytes Relative: 13 %
Lymphs Abs: 3.6 10*3/uL (ref 0.7–4.0)
MCH: 22.1 pg — ABNORMAL LOW (ref 26.0–34.0)
MCHC: 31.3 g/dL (ref 30.0–36.0)
MCV: 70.6 fL — ABNORMAL LOW (ref 80.0–100.0)
Monocytes Absolute: 0.8 10*3/uL (ref 0.1–1.0)
Monocytes Relative: 3 %
Neutro Abs: 23 10*3/uL — ABNORMAL HIGH (ref 1.7–7.7)
Neutrophils Relative %: 84 %
Platelets: 340 10*3/uL (ref 150–400)
RBC: 5.11 MIL/uL (ref 4.22–5.81)
RDW: 20.2 % — ABNORMAL HIGH (ref 11.5–15.5)
WBC: 27.4 10*3/uL — ABNORMAL HIGH (ref 4.0–10.5)
nRBC: 0 % (ref 0.0–0.2)
nRBC: 0 /100 WBC

## 2022-03-06 LAB — GLUCOSE, CAPILLARY
Glucose-Capillary: 72 mg/dL (ref 70–99)
Glucose-Capillary: 103 mg/dL — ABNORMAL HIGH (ref 70–99)
Glucose-Capillary: 79 mg/dL (ref 70–99)
Glucose-Capillary: 99 mg/dL (ref 70–99)

## 2022-03-06 LAB — COMPREHENSIVE METABOLIC PANEL
ALT: 28 U/L (ref 0–44)
AST: 27 U/L (ref 15–41)
Albumin: 2.2 g/dL — ABNORMAL LOW (ref 3.5–5.0)
Alkaline Phosphatase: 60 U/L (ref 38–126)
Anion gap: 6 (ref 5–15)
BUN: 61 mg/dL — ABNORMAL HIGH (ref 6–20)
CO2: 19 mmol/L — ABNORMAL LOW (ref 22–32)
Calcium: 8.5 mg/dL — ABNORMAL LOW (ref 8.9–10.3)
Chloride: 110 mmol/L (ref 98–111)
Creatinine, Ser: 2.89 mg/dL — ABNORMAL HIGH (ref 0.61–1.24)
GFR, Estimated: 28 mL/min — ABNORMAL LOW (ref >60)
Glucose, Bld: 144 mg/dL — ABNORMAL HIGH (ref 70–99)
Potassium: 4.5 mmol/L (ref 3.5–5.1)
Sodium: 135 mmol/L (ref 135–145)
Total Bilirubin: 1 mg/dL (ref 0.3–1.2)
Total Protein: 5.6 g/dL — ABNORMAL LOW (ref 6.5–8.1)

## 2022-03-06 LAB — HEMOGLOBIN A1C
Hgb A1c MFr Bld: 7.3 % — ABNORMAL HIGH (ref 4.8–5.6)
Mean Plasma Glucose: 162.81 mg/dL

## 2022-03-06 LAB — PROCALCITONIN: Procalcitonin: 9.15 ng/mL

## 2022-03-06 LAB — HIV ANTIBODY (ROUTINE TESTING W REFLEX): HIV Screen 4th Generation wRfx: NONREACTIVE

## 2022-03-06 LAB — MAGNESIUM: Magnesium: 2 mg/dL (ref 1.7–2.4)

## 2022-03-06 LAB — TSH: TSH: 2.85 u[IU]/mL (ref 0.350–4.500)

## 2022-03-06 MED ORDER — INSULIN GLARGINE-YFGN 100 UNIT/ML ~~LOC~~ SOLN
40.0000 [IU] | Freq: Every day | SUBCUTANEOUS | Status: DC
  Administered 2022-03-06: 40 [IU] via SUBCUTANEOUS
  Filled 2022-03-06 (×2): qty 0.4

## 2022-03-06 MED ORDER — FUROSEMIDE 10 MG/ML IJ SOLN
80.0000 mg | Freq: Two times a day (BID) | INTRAMUSCULAR | Status: DC
  Administered 2022-03-06: 80 mg via INTRAVENOUS
  Filled 2022-03-06: qty 8

## 2022-03-06 MED ORDER — GUAIFENESIN-DM 100-10 MG/5ML PO SYRP
5.0000 mL | ORAL_SOLUTION | ORAL | Status: DC | PRN
  Administered 2022-03-06 – 2022-03-09 (×6): 5 mL via ORAL
  Filled 2022-03-06 (×6): qty 5

## 2022-03-06 MED ORDER — VANCOMYCIN HCL 1250 MG/250ML IV SOLN
1250.0000 mg | INTRAVENOUS | Status: DC
  Administered 2022-03-07 – 2022-03-09 (×3): 1250 mg via INTRAVENOUS
  Filled 2022-03-06 (×4): qty 250

## 2022-03-06 MED ORDER — SODIUM CHLORIDE 0.9 % IV SOLN
2.0000 g | INTRAVENOUS | Status: DC
  Administered 2022-03-06 – 2022-03-09 (×4): 2 g via INTRAVENOUS
  Filled 2022-03-06 (×5): qty 20

## 2022-03-06 NOTE — Consult Note (Addendum)
?Cardiology Consultation:  ? ?Patient ID: Jeffery Villa ?MRN: [id number]; DOB: 24-Oct-1986 ? ?Admit date: 03/05/2022 ?Date of Consult: 03/06/2022 ? ?PCP:  Patient, [name] ?  ?[facility] Providers ?Cardiologist:  Previously [facility] - not covered by his insurance   ? ? ?Patient Profile:  ? ?Jeffery Villa is a 36 y.o. male with a hx of hypertension, hyperlipidemia, insulin-dependent diabetes mellitus, CKD stage III, OSA and chronic systolic heart failure who is being seen 03/06/2022 for the evaluation of acute on chronic combined systolic and diastolic CHF at the request of Dr. Broadus John. ? ?History of Present Illness:  ? ?Jeffery Villa is a 36 year old male with past medical history of hypertension, hyperlipidemia, insulin-dependent diabetes mellitus, CKD stage III, OSA and chronic systolic heart failure.  His mother also had heart failure in her 11s and passed away from the heart failure.  Although he has been diagnosed with obstructive sleep apnea, however he has not been able to tolerate the CPAP therapy.  Patient was diagnosed with high blood pressure as early as high school when he was playing football and has not been taking medication consistently until college.  He says his blood pressure often runs in the 140-160 or higher.  Creatinine has been hovering around 1.6-2.0 for the past 3 years.  Urinalysis obtained in 2020 showed >300 protein in the urine.  Around June 2022, he was evaluated by his primary care provider for dyspnea on exertion and leg edema.  His PCP recommended a cardiology evaluation and outpatient echocardiogram.  Unfortunately the echocardiogram was not done until September 2022.  Echocardiogram performed on 07/23/2021 demonstrated EF 20 to 25%, severe hypokinesis of the left ventricle, moderate to severe MR, moderate to severe TR, mild to moderately dilated right atrium, moderately dilated left atrium, restrictive filling pattern noted in the left ventricle,  trivial pericardial effusion, RVSP 60 mmHg.  Patient was started on furosemide.  He was also placed on 12.5 mg daily of spironolactone, 12.5 mg twice a day of carvedilol, Farxiga, lisinopril, amlodipine and Imdur.  Although patient does not remember ever started on Imdur.  Heart monitor performed in October 2022 demonstrated minimal heart rate 78, maximal heart rate 120, average heart rate 92, patient remained tachycardic 8.5% of total time, 3 episodes of wide-complex tachycardia with longest run 20 beats.  13 patient triggered episodes all showed sinus rhythm with few PVCs and PACs noted, 1 episode of 10 beat run of wide-complex tachycardia.  His last office visit with Dr. Marina Goodell of [facility] cardiology service was on 07/29/2021.  It was recommended for the patient to undergo outpatient Myoview, started on LifeVest then repeat echocardiogram in 3 months.  Unfortunately, because his insurance no longer covers [facility], he has not been back to see his cardiologist.  He wore the LifeVest for about a week before he discontinued it.  His weight at the last office visit was 265 pounds. ? ?Other than the Imdur, he has been compliant with all the other medications.  He was admitted to [facility] on 03/06/2019 due to left lower extremity pain.  He was concerned that he might have DVT.  On arrival, and white blood cell count was elevated at 30.7.  Hemoglobin 11.6.  Chest x-ray showed no acute heart failure.  Venous Doppler negative for DVT.  BNP was elevated at 2958.  Creatinine 2.49 (baseline creatinine 1.6-2.0).  Patient was felt to be slightly volume overloaded as his weight was 270 pounds and he was complained  of PND recently.  He has been placed on IV Lasix 40 mg twice a day.  He was treated with both Zosyn and vancomycin.  Overnight, creatinine went up to 2.89.  Hemoglobin 7.3.  Thyroid normal. ? ? ? ? ?Past Medical History:  ?Diagnosis Date  ? CHF (congestive heart failure) ([place])   ? Diabetes mellitus  without complication ([place])   ? diagnosed at age 53 due to obesity, started on insulin at that time   ? High cholesterol   ? Hypertension   ? ? ?Past Surgical History:  ?Procedure Laterality Date  ? 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 Procedure: AMPUTATION RIGHT 5th RAY;  Surgeon: Newt Minion, MD;  Location: [facility];  Service: Orthopedics;  Laterality: Right;  ?  ? ?Home Medications:  ?Prior to Admission medications   ?Medication Sig Start Date End Date Taking? Authorizing Provider  ?acetaminophen (TYLENOL) 325 MG tablet Take 650 mg by mouth every 6 (six) hours as needed for mild pain. Reported on 10/09/2015    [provider]  ?amLODipine-benazepril (LOTREL) 10-40 MG per capsule Take 1 capsule by mouth daily. ?Patient not taking: Reported on 10/09/2015 09/18/14   Verlee Monte, MD  ?atorvastatin (LIPITOR) 20 MG tablet Take 1 tablet (20 mg total) by mouth daily. 09/18/14   Verlee Monte, MD  ?doxycycline (VIBRA-TABS) 100 MG tablet Take 1 tablet (100 mg total) by mouth 2 (two) times daily. 07/14/21   Persons, Bevely Palmer, PA  ?glucose monitoring kit (FREESTYLE) monitoring kit 1 each by Does not apply route as needed for other. ?Patient not taking: Reported on 10/09/2015 09/18/14   Verlee Monte, MD  ?Insulin Glargine (LANTUS SOLOSTAR) 100 UNIT/ML Solostar Pen Inject 60 Units into the skin daily at 10 pm. 09/18/14   Verlee Monte, MD  ?Insulin Pen Needle 30G X 5 MM MISC New needle with every insulin adminsration 09/18/14   Verlee Monte, MD  ?metFORMIN (GLUCOPHAGE) 1000 MG tablet Take 1 tablet (1,000 mg total) by mouth 2 (two) times daily with a meal. 09/18/14   Verlee Monte, MD  ?mupirocin ointment (BACTROBAN) 2 % Apply 1 application topically 2 (two) times daily. Apply to the affected area 2 times a day 07/14/21   Persons, Bevely Palmer, [place]  ? ? ?Inpatient Medications: ?Scheduled Meds: ? atorvastatin  20 mg Oral Daily  ? carvedilol  12.5 mg Oral BID WC  ? dapagliflozin propanediol  10 mg Oral Daily  ? furosemide  40 mg  Intravenous Q12H  ? heparin  5,000 Units Subcutaneous Q8H  ? insulin aspart  0-15 Units Subcutaneous TID WC  ? insulin aspart  0-5 Units Subcutaneous QHS  ? insulin glargine-yfgn  50 Units Subcutaneous QHS  ? vancomycin variable dose per unstable renal function (pharmacist dosing)   Does not apply See admin instructions  ? ?Continuous Infusions: ? sodium chloride    ? piperacillin-tazobactam (ZOSYN)  IV 3.375 g (03/06/22 0527)  ? ?PRN Meds: ?sodium chloride, acetaminophen **OR** acetaminophen, HYDROcodone-acetaminophen ? ?Allergies:   No Known Allergies ? ?Social History:   ?Social History  ? ?Socioeconomic History  ? Marital status: Single  ?  Spouse name: Not on file  ? Number of children: Not on file  ? Years of education: Not on file  ? Highest education level: Not on file  ?Occupational History  ? Not on file  ?Tobacco Use  ? Smoking status: Never  ? Smokeless tobacco: Not on file  ?Vaping Use  ? Vaping Use: Never used  ?Substance and Sexual Activity  ?  Alcohol use: No  ?  Alcohol/week: 0.0 standard drinks  ? Drug use: No  ? Sexual activity: Not on file  ?Other Topics Concern  ? Not on file  ?Social History Narrative  ? Not on file  ? ?Social Determinants of Health  ? ?Financial Resource Strain: Not on file  ?Food Insecurity: Not on file  ?Transportation Needs: Not on file  ?Physical Activity: Not on file  ?Stress: Not on file  ?Social Connections: Not on file  ?Intimate Partner Violence: Not on file  ?  ?Family History:   ? ?Family History  ?Problem Relation Age of Onset  ? Diabetes Mother   ? Hypertension Mother   ? Stroke Mother   ? Alcohol abuse Father   ? Cancer Maternal Grandmother   ? Diabetes Maternal Grandmother   ? Hypertension Maternal Grandmother   ?  ? ?ROS:  ?Please see the history of present illness.  ? ?All other ROS reviewed and negative.    ? ?Physical Exam/Data:  ? ?Vitals:  ? 03/05/22 2150 03/05/22 2347 03/06/22 0303 03/06/22 0757  ?BP: 114/76 117/78 110/85 109/85  ?Pulse:  98 (!) 104 (!)  106  ?Resp: (!) _0 ?Temp: 98.4 ?F (36.9 ?C) (!) 101 ?F (38.3 ?C) 99.8 ?F (37.7 ?C) 99 ?F (37.2 ?C)  ?TempSrc: Oral Oral Oral Oral  ?SpO2: 98% 91% 98% 96%  ?Weight: 122.2 kg     ?Height: 6' 2" (1.88 m

## 2022-03-06 NOTE — Plan of Care (Signed)
?  Problem: Clinical Measurements: ?Goal: Ability to avoid or minimize complications of infection will improve ?Outcome: Progressing ?  ?Problem: Skin Integrity: ?Goal: Skin integrity will improve ?Outcome: Progressing ?  ?

## 2022-03-06 NOTE — Progress Notes (Signed)
?PROGRESS NOTE ? ? ? ?Jeffery Villa  [id number] DOB: 27-Nov-1985 DOA: 03/05/2022 ?PCP: Patient, [name]  ?36/M with history of hypertension, type 2 diabetes mellitus, CKD 3a, OSA, chronic systolic CHF with [id number], seen by [facility] cardiology twice in 2022, presented to the ED with 3 to 4 weeks of edema, weight gain and increased pain in tenderness in his left leg. ?-In the ED he was febrile, tachycardic, WBC was 30 K, creatinine of 2.8, BNP 2958, noted to have 3+ edema, chest x-ray without pulmonary edema, cardiomegaly noted, Dopplers negative for DVT ?-Started on antibiotics ? ?Subjective: ?Feels a little better today after starting antibiotics ? ?Assessment and Plan: ? ?Sepsis, poa ?Left leg cellulitis ?-Follow-up blood cultures ?-Continue vancomycin, will change Zosyn to IV ceftriaxone ?-Febrile this morning ? ?Acute on chronic systolic and diastolic CHF ?Moderate to severe MR, TR ?Essential hypertension ?-Echo at [facility] 9/22 noted EF of 20-25% with moderate to severe MR, restrictive pattern ?-Has not had an ischemic work-up ?-Unfortunately with AKI in the setting of sepsis and possibly cardiorenal syndrome ?-Continue IV Lasix today, carvedilol, Farxiga ?-Cardiology following, repeat echo today ?-Will need ischemic evaluation if creatinine improves ?-Monitor urine output, daily weights, BMP in a.m. ? ?AKI on CKD 3a ?-Baseline creatinine around 1.9-2.1 ?-Now with creatinine of 2.89 ?-Likely multifactorial, sepsis, ACE inhibitor use, cardiorenal ?-Monitor with diuresis, lisinopril discontinued ? ?Type 2 diabetes mellitus ?-Insulin-dependent, CBGs are stable, continue glargine, will decrease dose, Farxiga and sliding scale, A1c 7.3 ? ?OSA ? ?DVT prophylaxis: Heparin subcutaneous ?Code Status: Full code ?Family Communication: Discussed with patient in detail, no family at bedside ?Disposition Plan: Home likely 4 to 5 days pending resolution of sepsis, cellulitis and volume status ? ?Consultants:   ?Cardiology ? ?Procedures:  ? ? ? ?Objective: ?Vitals:  ? 03/05/22 2347 03/06/22 0303 03/06/22 0757 03/06/22 1108  ?BP: 117/78 110/85 109/85 99/66  ?Pulse: 98 (!) 104 (!) 106 90  ?Resp: 20 20 20 20   ?Temp: (!) 101 ?F (38.3 ?C) 99.8 ?F (37.7 ?C) 99 ?F (37.2 ?C) 98.1 ?F (36.7 ?C)  ?TempSrc: Oral Oral Oral Oral  ?SpO2: 91% 98% 96% 96%  ?Weight:      ?Height:      ? ? ?Intake/Output Summary (Last 24 hours) at 03/06/2022 1115 ?Last data filed at 03/06/2022 [id number] ?Gross per 24 hour  ?Intake 976.75 ml  ?Output 1100 ml  ?Net -123.25 ml  ? ?Filed Weights  ? 03/05/22 1228 03/05/22 2150  ?Weight: 122.5 kg 122.2 kg  ? ? ?Examination: ? ?General exam: Young male sitting up in the recliner, AAOx3, no distress ?CVS: S1-S2, regular rhythm ?Lungs: Clear bilaterally ?Abdomen: Soft, nontender, bowel sounds present ?Extremities: 2+ edema, left lower leg with erythema warmth and tenderness  ?Skin: As above ?Psychiatry:  Mood & affect appropriate.  ? ? ? ?Data Reviewed:  ? ?CBC: ?Recent Labs  ?Lab 03/05/22 ?1249 03/06/22 ?0245  ?WBC 30.7* 27.4*  ?NEUTROABS 20.7* 23.0*  ?HGB 11.6* 11.3*  ?HCT 37.5* 36.1*  ?MCV 70.6* 70.6*  ?PLT 378 340  ? ?Basic Metabolic Panel: ?Recent Labs  ?Lab 03/05/22 ?1249 03/06/22 ?0245  ?NA 137 135  ?K 4.7 4.5  ?CL 108 110  ?CO2 18* 19*  ?GLUCOSE 144* 144*  ?BUN 68* 61*  ?CREATININE 2.49* 2.89*  ?CALCIUM 9.1 8.5*  ?MG  --  2.0  ? ?GFR: ?Estimated Creatinine Clearance: 50 mL/min (A) (by C-G formula based on SCr of 2.89 mg/dL (H)). ?Liver Function Tests: ?Recent Labs  ?Lab 03/05/22 ?1443  03/06/22 ?0245  ?AST 37 27  ?ALT 35 28  ?ALKPHOS 65 60  ?BILITOT 0.8 1.0  ?PROT 6.4* 5.6*  ?ALBUMIN 3.4* 2.2*  ? ?No results for input(s): LIPASE, AMYLASE in the last 168 hours. ?No results for input(s): AMMONIA in the last 168 hours. ?Coagulation Profile: ?Recent Labs  ?Lab 03/05/22 ?1443  ?INR 1.3*  ? ?Cardiac Enzymes: ?Recent Labs  ?Lab 03/05/22 ?1443  ?CKTOTAL 402*  ? ?BNP (last 3 results) ?No results for input(s): PROBNP in the  last 8760 hours. ?HbA1C: ?Recent Labs  ?  03/06/22 ?0245  ?HGBA1C 7.3*  ? ?CBG: ?Recent Labs  ?Lab 03/05/22 ?2232 03/06/22 ?0843  ?GLUCAP 198* 72  ? ?Lipid Profile: ?No results for input(s): CHOL, HDL, LDLCALC, TRIG, CHOLHDL, LDLDIRECT in the last 72 hours. ?Thyroid Function Tests: ?Recent Labs  ?  03/06/22 ?0245  ?TSH 2.850  ? ?Anemia Panel: ?No results for input(s): VITAMINB12, FOLATE, FERRITIN, TIBC, IRON, RETICCTPCT in the last 72 hours. ?Urine analysis: ?   ?Component Value Date/Time  ? COLORURINE COLORLESS (A) 03/05/2022 1443  ? APPEARANCEUR CLEAR 03/05/2022 1443  ? LABSPEC 1.007 03/05/2022 1443  ? PHURINE 5.0 03/05/2022 1443  ? GLUCOSEU 500 (A) 03/05/2022 1443  ? HGBUR SMALL (A) 03/05/2022 1443  ? [place] NEGATIVE 03/05/2022 1443  ? [place] NEGATIVE 03/05/2022 1443  ? PROTEINUR 30 (A) 03/05/2022 1443  ? NITRITE NEGATIVE 03/05/2022 1443  ? LEUKOCYTESUR NEGATIVE 03/05/2022 1443  ? ?Sepsis Labs: ?@LABRCNTIP (procalcitonin:4,lacticidven:4) ? ?) ?Recent Results (from the past 240 hour(s))  ?Culture, blood (Routine x 2)     Status: None (Preliminary result)  ? Collection Time: 03/05/22  2:43 PM  ? Specimen: BLOOD RIGHT HAND  ?Result Value Ref Range Status  ? Specimen Description   Final  ?  BLOOD RIGHT HAND ?Performed at [facility], [address] [place], [place] [address] ?  ? Special Requests   Final  ?  BOTTLES DRAWN AEROBIC AND ANAEROBIC Blood Culture adequate volume ?Performed at [facility], [address], [place], [place] [address] ?  ? Culture   Final  ?  NO GROWTH < 24 HOURS ?Performed at [facility], [place] [address] [place], [place] [address] ?  ? Report Status PENDING  Incomplete  ?Resp Panel by RT-PCR (Flu A&B, Covid) Nasopharyngeal Swab     Status: None  ? Collection Time: 03/05/22  6:07 PM  ? Specimen: Nasopharyngeal Swab; Nasopharyngeal(NP) swabs in vial transport medium  ?Result Value Ref Range Status  ? SARS Coronavirus 2 by RT PCR NEGATIVE  NEGATIVE Final  ?  Comment: (NOTE) ?SARS-CoV-2 target nucleic acids are NOT DETECTED. ? ?The SARS-CoV-2 RNA is generally detectable in upper respiratory ?specimens during the acute phase of infection. The lowest ?concentration of SARS-CoV-2 viral copies this assay can detect is ?138 copies/mL. A negative result does not preclude SARS-Cov-2 ?infection and should not be used as the sole basis for treatment or ?other patient management decisions. A negative result may occur with  ?improper specimen collection/handling, submission of specimen other ?than nasopharyngeal swab, presence of viral mutation(s) within the ?areas targeted by this assay, and inadequate number of viral ?copies(<138 copies/mL). A negative result must be combined with ?clinical observations, patient history, and epidemiological ?information. The expected result is Negative. ? ?Fact Sheet for Patients:  ?EntrepreneurPulse.com.au ? ?Fact Sheet for Healthcare Providers:  ?IncredibleEmployment.be ? ?This test is no t yet approved or cleared by the [place] FDA and  ?has been authorized for detection and/or diagnosis of  SARS-CoV-2 by ?FDA under an Emergency Use Authorization (EUA). This EUA will remain  ?in effect (meaning this test can be used) for the duration of the ?COVID-19 declaration under Section 564(b)(1) of the Act, 21 ?U.S.C.section 360bbb-3(b)(1), unless the authorization is terminated  ?or revoked sooner.  ? ? ?  ? Influenza A by PCR NEGATIVE NEGATIVE Final  ? Influenza B by PCR NEGATIVE NEGATIVE Final  ?  Comment: (NOTE) ?The Xpert Xpress SARS-CoV-2/FLU/RSV plus assay is intended as an aid ?in the diagnosis of influenza from Nasopharyngeal swab specimens and ?should not be used as a sole basis for treatment. Nasal washings and ?aspirates are unacceptable for Xpert Xpress SARS-CoV-2/FLU/RSV ?testing. ? ?Fact Sheet for Patients: ?EntrepreneurPulse.com.au ? ?Fact Sheet for Healthcare  Providers: ?IncredibleEmployment.be ? ?This test is not yet approved or cleared by the [place] FDA and ?has been authorized for detection and/or diagnosis of SARS-CoV-2 by ?FDA under

## 2022-03-06 NOTE — Progress Notes (Signed)
?  Echocardiogram ?2D Echocardiogram has been performed. ? ?Jeffery Villa ?03/06/2022, 2:05 PM ?

## 2022-03-06 NOTE — Progress Notes (Addendum)
Pharmacy Antibiotic Note ? ?Jeffery Villa is a 36 y.o. male admitted on 03/05/2022 with sepsis.  Pharmacy has been consulted for vancomycin dosing. ?-WBC= 30.7>>27.4, tmax= 100.1> afebrile, LA 1.9>1.8 ? ?Plan: ?-Vancomycin 2500mg  IV x1 then 1250 mg q 24h (expected AUC 522, Scr 2.89) ?-Will follow renal function, cultures and clinical progress ?-Zosyn was changed to ceftriaxone 2g daily per MD ? ?Height: 6\' 2"  (188 cm) ?Weight: 122.2 kg (269 lb 4.8 oz) ?IBW/kg (Calculated) : 82.2 ? ?Temp (24hrs), Avg:99.3 ?F (37.4 ?C), Min:98.4 ?F (36.9 ?C), Max:101 ?F (38.3 ?C) ? ?Recent Labs  ?Lab 03/05/22 ?1249 03/05/22 ?1443 03/05/22 ?1625 03/06/22 ?0245  ?WBC 30.7*  --   --  27.4*  ?CREATININE 2.49*  --   --  2.89*  ?LATICACIDVEN  --  1.9 1.8  --   ? ?  ?Estimated Creatinine Clearance: 50 mL/min (A) (by C-G formula based on SCr of 2.89 mg/dL (H)).   ? ?No Known Allergies ? ?Antimicrobials this admission: ?5/12 zosyn >>5/13 ?5/12 vanc >> ?5/13 ceftriaxone >> ? ?Dose adjustments this admission: ? ? ?Microbiology results: ?5/12 Bcx ngtd ? ?Thank you for allowing pharmacy to be a part of this patient?s care. ? ? ?6/13, PharmD ?[id number] Cardiology Pharmacy Resident ?03/06/2022  10:15 AM ? ?Please check [email or web address] for unit-specific pharmacy phone numbers. ? ? ?

## 2022-03-07 DIAGNOSIS — A419 Sepsis, unspecified organism: Secondary | ICD-10-CM | POA: Diagnosis not present

## 2022-03-07 DIAGNOSIS — L0390 Cellulitis, unspecified: Secondary | ICD-10-CM | POA: Diagnosis not present

## 2022-03-07 LAB — CBC
HCT: 35.9 % — ABNORMAL LOW (ref 39.0–52.0)
Hemoglobin: 11.5 g/dL — ABNORMAL LOW (ref 13.0–17.0)
MCH: 22.4 pg — ABNORMAL LOW (ref 26.0–34.0)
MCHC: 32 g/dL (ref 30.0–36.0)
MCV: 70 fL — ABNORMAL LOW (ref 80.0–100.0)
Platelets: 381 10*3/uL (ref 150–400)
RBC: 5.13 MIL/uL (ref 4.22–5.81)
RDW: 20 % — ABNORMAL HIGH (ref 11.5–15.5)
WBC: 28.4 10*3/uL — ABNORMAL HIGH (ref 4.0–10.5)
nRBC: 0 % (ref 0.0–0.2)

## 2022-03-07 LAB — GLUCOSE, CAPILLARY
Glucose-Capillary: 65 mg/dL — ABNORMAL LOW (ref 70–99)
Glucose-Capillary: 79 mg/dL (ref 70–99)
Glucose-Capillary: 109 mg/dL — ABNORMAL HIGH (ref 70–99)
Glucose-Capillary: 84 mg/dL (ref 70–99)
Glucose-Capillary: 151 mg/dL — ABNORMAL HIGH (ref 70–99)

## 2022-03-07 LAB — BASIC METABOLIC PANEL
Anion gap: 9 (ref 5–15)
BUN: 60 mg/dL — ABNORMAL HIGH (ref 6–20)
CO2: 19 mmol/L — ABNORMAL LOW (ref 22–32)
Calcium: 8.3 mg/dL — ABNORMAL LOW (ref 8.9–10.3)
Chloride: 108 mmol/L (ref 98–111)
Creatinine, Ser: 3.03 mg/dL — ABNORMAL HIGH (ref 0.61–1.24)
GFR, Estimated: 27 mL/min — ABNORMAL LOW (ref >60)
Glucose, Bld: 57 mg/dL — ABNORMAL LOW (ref 70–99)
Potassium: 4.3 mmol/L (ref 3.5–5.1)
Sodium: 136 mmol/L (ref 135–145)

## 2022-03-07 MED ORDER — SODIUM CHLORIDE 0.9% FLUSH
3.0000 mL | Freq: Two times a day (BID) | INTRAVENOUS | Status: DC
  Administered 2022-03-07: 3 mL via INTRAVENOUS

## 2022-03-07 MED ORDER — SODIUM CHLORIDE 0.9 % IV SOLN: 250.0000 mL | INTRAVENOUS | Status: DC | PRN

## 2022-03-07 MED ORDER — FUROSEMIDE 10 MG/ML IJ SOLN
80.0000 mg | Freq: Once | INTRAMUSCULAR | Status: AC
Start: 2022-03-07 — End: 2022-03-07
  Administered 2022-03-07: 80 mg via INTRAVENOUS
  Filled 2022-03-07: qty 8

## 2022-03-07 MED ORDER — SODIUM CHLORIDE 0.9 % IV SOLN: INTRAVENOUS | Status: DC

## 2022-03-07 MED ORDER — ISOSORB DINITRATE-HYDRALAZINE 20-37.5 MG PO TABS: 0.5000 | ORAL_TABLET | Freq: Three times a day (TID) | ORAL | Status: DC

## 2022-03-07 MED ORDER — MILRINONE LACTATE IN DEXTROSE 20-5 MG/100ML-% IV SOLN
0.2500 ug/kg/min | INTRAVENOUS | Status: DC
  Administered 2022-03-07: 0.25 ug/kg/min via INTRAVENOUS
  Filled 2022-03-07 (×2): qty 100

## 2022-03-07 MED ORDER — INSULIN GLARGINE-YFGN 100 UNIT/ML ~~LOC~~ SOLN
25.0000 [IU] | Freq: Every day | SUBCUTANEOUS | Status: DC
Start: 2022-03-07 — End: 2022-03-08
  Administered 2022-03-07: 25 [IU] via SUBCUTANEOUS
  Filled 2022-03-07 (×2): qty 0.25

## 2022-03-07 MED ORDER — SODIUM CHLORIDE 0.9% FLUSH: 3.0000 mL | INTRAVENOUS | Status: DC | PRN

## 2022-03-07 MED ORDER — CARVEDILOL 6.25 MG PO TABS
6.2500 mg | ORAL_TABLET | Freq: Two times a day (BID) | ORAL | Status: DC
Start: 2022-03-07 — End: 2022-03-09
  Administered 2022-03-07 – 2022-03-09 (×4): 6.25 mg via ORAL
  Filled 2022-03-07 (×4): qty 1

## 2022-03-07 MED ORDER — ASPIRIN 81 MG PO CHEW
81.0000 mg | CHEWABLE_TABLET | ORAL | Status: AC
  Administered 2022-03-08: 81 mg via ORAL
  Filled 2022-03-07: qty 1

## 2022-03-07 NOTE — Progress Notes (Addendum)
?PROGRESS NOTE ? ? ? 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 ?-In the ED he was febrile, tachycardic, WBC was 30 K, creatinine of 2.8, BNP 2958, noted to have 3+ edema, chest x-ray without pulmonary edema, cardiomegaly noted, Dopplers negative for DVT ?-Started on antibiotics, diuretics ?-5/14, started on milrinone ? ?Subjective: ?-Breathing better overall, discomfort in his left leg is improving ? ?Assessment and Plan: ? ?Sepsis, poa ?Left leg cellulitis ?-Dopplers are negative for DVT, blood cultures negative X 2 days ?-Continue IV ceftriaxone and vancomycin ?-CBC in a.m. ? ?Acute on chronic systolic and diastolic CHF ?Moderate to severe MR, TR ?Essential hypertension ?-Echo at [facility] 9/22 noted EF of 20-25% with moderate to severe MR, restrictive pattern ?-Has not had an ischemic work-up ?-Unfortunately with AKI in the setting of sepsis and possibly cardiorenal syndrome ?-Currently on IV Lasix today, carvedilol, Farxiga ?-Cardiology following, repeat echo with EF of 20-25%, moderate MR ?-Bump in creatinine noted, starting milrinone, ?  DC [place] ?-Will need ischemic evaluation if creatinine improves ?-Monitor urine output, daily weights, BMP in a.m. ? ?AKI on CKD 3a ?-Baseline creatinine around 1.9-2.1 ?-Now with creatinine of 2.89 ?-Likely multifactorial, sepsis, ACE inhibitor use, cardiorenal ?-Starting milrinone, continue diuretics ? ?Type 2 diabetes mellitus ?-Insulin-dependent, CBGs were low, will decrease glargine dose further, Farxiga and sliding scale, A1c 7.3 ?-Diabetes coordinator consult, was on metformin PTA ? ?OSA ? ?DVT prophylaxis: Heparin subcutaneous ?Code Status: Full  code ?Family Communication: Discussed with patient and wife at bedside ?Disposition Plan: Home likely 4 to 5 days pending resolution of sepsis, cellulitis and volume status ? ?Consultants:  ?Cardiology ? ?Procedures:  ? ? ? ?Objective: ?Vitals:  ? 03/07/22 0020 03/07/22 0555 03/07/22 0609 03/07/22 1100  ?BP: 103/78 (!) 122/91    ?Pulse: (!) 103 95  98  ?Resp: 20 20  20   ?Temp: 98 ?F (36.7 ?C) 97.7 ?F (36.5 ?C)    ?TempSrc: Oral Oral    ?SpO2: 99% 97%  98%  ?Weight:   120.1 kg   ?Height:      ? ? ?Intake/Output Summary (Last 24 hours) at 03/07/2022 1129 ?Last data filed at 03/07/2022 1115 ?Gross per 24 hour  ?Intake 700 ml  ?Output 3200 ml  ?Net -2500 ml  ? ?Filed Weights  ? 03/05/22 1228 03/05/22 2150 03/07/22 0609  ?Weight: 122.5 kg 122.2 kg 120.1 kg  ? ? ?Examination: ? ?General exam: Young male sitting up in bed, AAOx3, no distress ?CVS: S1-S2, regular rhythm ?Lungs: Clear bilaterally ?Abdomen: Soft, nontender, bowel sounds present ?Extremities: 1+ edema in right leg, left leg with 2+ edema, erythema warmth and tenderness laterally  ?Skin: As above ?Psychiatry:  Mood & affect appropriate.  ? ? ? ?Data Reviewed:  ? ?CBC: ?Recent Labs  ?Lab 03/05/22 ?1249 03/06/22 ?03/08/22 03/07/22 ?0147  ?WBC 30.7* 27.4* 28.4*  ?NEUTROABS 20.7* 23.0*  --   ?HGB 11.6* 11.3* 11.5*  ?HCT 37.5* 36.1* 35.9*  ?MCV 70.6* 70.6* 70.0*  ?PLT 378 340 381  ? ?Basic Metabolic Panel: ?Recent Labs  ?Lab 03/05/22 ?1249 03/06/22 ?03/08/22 03/07/22 ?0147  ?NA 137 135 136  ?K 4.7 4.5 4.3  ?CL 108 110 108  ?CO2 18* 19* 19*  ?  GLUCOSE 144* 144* 57*  ?BUN 68* 61* 60*  ?CREATININE 2.49* 2.89* 3.03*  ?CALCIUM 9.1 8.5* 8.3*  ?MG  --  2.0  --   ? ?GFR: ?Estimated Creatinine Clearance: 47.3 mL/min (A) (by C-G formula based on SCr of 3.03 mg/dL (H)). ?Liver Function Tests: ?Recent Labs  ?Lab 03/05/22 ?1443 03/06/22 ?0245  ?AST 37 27  ?ALT 35 28  ?ALKPHOS 65 60  ?BILITOT 0.8 1.0  ?PROT 6.4* 5.6*  ?ALBUMIN 3.4* 2.2*  ? ?No results for input(s): LIPASE, AMYLASE in the  last 168 hours. ?No results for input(s): AMMONIA in the last 168 hours. ?Coagulation Profile: ?Recent Labs  ?Lab 03/05/22 ?1443  ?INR 1.3*  ? ?Cardiac Enzymes: ?Recent Labs  ?Lab 03/05/22 ?1443  ?CKTOTAL 402*  ? ?BNP (last 3 results) ?No results for input(s): PROBNP in the last 8760 hours. ?HbA1C: ?Recent Labs  ?  03/06/22 ?0245  ?HGBA1C 7.3*  ? ?CBG: ?Recent Labs  ?Lab 03/06/22 ?7356 03/06/22 ?1121 03/06/22 ?1736 03/06/22 ?2125 03/07/22 ?7014  ?GLUCAP 72 103* 79 99 65*  ? ?Lipid Profile: ?No results for input(s): CHOL, HDL, LDLCALC, TRIG, CHOLHDL, LDLDIRECT in the last 72 hours. ?Thyroid Function Tests: ?Recent Labs  ?  03/06/22 ?0245  ?TSH 2.850  ? ?Anemia Panel: ?No results for input(s): VITAMINB12, FOLATE, FERRITIN, TIBC, IRON, RETICCTPCT in the last 72 hours. ?Urine analysis: ?   ?Component Value Date/Time  ? COLORURINE COLORLESS (A) 03/05/2022 1443  ? APPEARANCEUR CLEAR 03/05/2022 1443  ? LABSPEC 1.007 03/05/2022 1443  ? PHURINE 5.0 03/05/2022 1443  ? GLUCOSEU 500 (A) 03/05/2022 1443  ? HGBUR SMALL (A) 03/05/2022 1443  ? BILIRUBINUR NEGATIVE 03/05/2022 1443  ? KETONESUR NEGATIVE 03/05/2022 1443  ? PROTEINUR 30 (A) 03/05/2022 1443  ? NITRITE NEGATIVE 03/05/2022 1443  ? LEUKOCYTESUR NEGATIVE 03/05/2022 1443  ? ?Sepsis Labs: ?@[email or web address] (procalcitonin:4,lacticidven:4) ? ?) ?Recent Results (from the past 240 hour(s))  ?Culture, blood (Routine x 2)     Status: None (Preliminary result)  ? Collection Time: 03/05/22  2:43 PM  ? Specimen: BLOOD RIGHT HAND  ?Result Value Ref Range Status  ? Specimen Description   Final  ?  BLOOD RIGHT HAND ?Performed at [facility], [address] [place], [place] [address] ?  ? Special Requests   Final  ?  BOTTLES DRAWN AEROBIC AND ANAEROBIC Blood Culture adequate volume ?Performed at [facility], [address], [place], [place] [address] ?  ? Culture   Final  ?  NO GROWTH 2 DAYS ?Performed at [facility], [address],  [place], [place] [address] ?  ? Report Status PENDING  Incomplete  ?Resp Panel by RT-PCR (Flu A&B, Covid) Nasopharyngeal Swab     Status: None  ? Collection Time: 03/05/22  6:07 PM  ? Specimen: Nasopharyngeal Swab; Nasopharyngeal(NP) swabs in vial transport medium  ?Result Value Ref Range Status  ? SARS Coronavirus 2 by RT PCR NEGATIVE NEGATIVE Final  ?  Comment: (NOTE) ?SARS-CoV-2 target nucleic acids are NOT DETECTED. ? ?The SARS-CoV-2 RNA is generally detectable in upper respiratory ?specimens during the acute phase of infection. The lowest ?concentration of SARS-CoV-2 viral copies this assay can detect is ?138 copies/mL. A negative result does not preclude SARS-Cov-2 ?infection and should not be used as the sole basis for treatment or ?other patient management decisions. A negative result may occur with  ?improper specimen collection/handling, submission of specimen other ?than nasopharyngeal swab, presence of viral mutation(s) within the ?areas targeted by this assay, and inadequate  number of viral ?copies(<138 copies/mL). A negative result must be combined with ?clinical observations, patient history, and epidemiological ?information. The expected result is Negative. ? ?Fact Sheet for Patients:  ?BloggerCourse.com ? ?Fact Sheet for Healthcare Providers:  ?SeriousBroker.it ? ?This test is no t yet approved or cleared by the [place] FDA and  ?has been authorized for detection and/or diagnosis of SARS-CoV-2 by ?FDA under an Emergency Use Authorization (EUA). This EUA will remain  ?in effect (meaning this test can be used) for the duration of the ?COVID-19 declaration under Section 564(b)(1) of the Act, 21 ?U.S.C.section 360bbb-3(b)(1), unless the authorization is terminated  ?or revoked sooner.  ? ? ?  ? Influenza A by PCR NEGATIVE NEGATIVE Final  ? Influenza B by PCR NEGATIVE NEGATIVE Final  ?  Comment: (NOTE) ?The Xpert Xpress SARS-CoV-2/FLU/RSV plus assay is  intended as an aid ?in the diagnosis of influenza from Nasopharyngeal swab specimens and ?should not be used as a sole basis for treatment. Nasal washings and ?aspirates are unacceptable for Xpert Xpress SARS-CoV-2/FLU/

## 2022-03-07 NOTE — H&P (View-Only) (Signed)
Patient ID: Jeffery Villa, male   DOB: 1986-01-16, 36 y.o.   MRN: [id number] ?  ? ? Advanced Heart Failure Rounding Note ? ?PCP-Cardiologist: None  ? ?Subjective:   ? ?Good diuresis yesterday, weight down 5 lbs but creatinine up 2.89 => 3.03.   ? ?He remains on vancomycin/ceftriaxone, WBCs 28 and afebrile.  ? ?Echo reviewed: EF 20-25%, global hypokinesis with moderate LV dilation, mod-severe MR (severe by PISA ERO), moderate TR.  ? ? ?Objective:   ?Weight Range: ?120.1 kg ?Body mass index is 33.99 kg/m?.  ? ?Vital Signs:   ?Temp:  [97.7 ?F (36.5 ?C)-98.7 ?F (37.1 ?C)] 97.7 ?F (36.5 ?C) (05/14 0555) ?Pulse Rate:  [85-106] 95 (05/14 0555) ?Resp:  [18-20] 20 (05/14 0555) ?BP: (99-126)/(66-91) 122/91 (05/14 0555) ?SpO2:  [96 %-100 %] 97 % (05/14 0555) ?Weight:  [120.1 kg] 120.1 kg (05/14 0609) ?Last BM Date : 03/06/22 ? ?Weight change: ?Filed Weights  ? 03/05/22 1228 03/05/22 2150 03/07/22 0609  ?Weight: 122.5 kg 122.2 kg 120.1 kg  ? ? ?Intake/Output:  ? ?Intake/Output Summary (Last 24 hours) at 03/07/2022 0949 ?Last data filed at 03/07/2022 0800 ?Gross per 24 hour  ?Intake 460 ml  ?Output 3775 ml  ?Net -3315 ml  ?  ? ? ?Physical Exam  ?  ?General:  Well appearing. No resp difficulty ?HEENT: Normal ?Neck: Supple. JVP 10-12 cm. Carotids 2+ bilat; no bruits. No lymphadenopathy or thyromegaly appreciated. ?Cor: PMI lateral. Regular rate & rhythm. 2/6 HSM apex.  ?Lungs: Clear ?Abdomen: Soft, nontender, nondistended. No hepatosplenomegaly. No bruits or masses. Good bowel sounds. ?Extremities: No cyanosis, clubbing, rash. 2+ edema left lower leg, erythema lower leg.  1+ edema right lower leg.  ?Neuro: Alert & orientedx3, cranial nerves grossly intact. moves all 4 extremities w/o difficulty. Affect pleasant ? ? ?Telemetry  ? ?NSR 100s (personally reviewed) ? ?Labs  ?  ?CBC ?Recent Labs  ?  03/05/22 ?1249 03/06/22 ?3149 03/07/22 ?0147  ?WBC 30.7* 27.4* 28.4*  ?NEUTROABS 20.7* 23.0*  --   ?HGB 11.6* 11.3* 11.5*  ?HCT 37.5*  36.1* 35.9*  ?MCV 70.6* 70.6* 70.0*  ?PLT 378 340 381  ? ?Basic Metabolic Panel ?Recent Labs  ?  03/06/22 ?7026 03/07/22 ?0147  ?NA 135 136  ?K 4.5 4.3  ?CL 110 108  ?CO2 19* 19*  ?GLUCOSE 144* 57*  ?BUN 61* 60*  ?CREATININE 2.89* 3.03*  ?CALCIUM 8.5* 8.3*  ?MG 2.0  --   ? ?Liver Function Tests ?Recent Labs  ?  03/05/22 ?1443 03/06/22 ?0245  ?AST 37 27  ?ALT 35 28  ?ALKPHOS 65 60  ?BILITOT 0.8 1.0  ?PROT 6.4* 5.6*  ?ALBUMIN 3.4* 2.2*  ? ?No results for input(s): LIPASE, AMYLASE in the last 72 hours. ?Cardiac Enzymes ?Recent Labs  ?  03/05/22 ?1443  ?CKTOTAL 402*  ? ? ?BNP: ?BNP (last 3 results) ?Recent Labs  ?  03/05/22 ?1249  ?BNP 2,958.3*  ? ? ?ProBNP (last 3 results) ?No results for input(s): PROBNP in the last 8760 hours. ? ? ?D-Dimer ?No results for input(s): DDIMER in the last 72 hours. ?Hemoglobin A1C ?Recent Labs  ?  03/06/22 ?0245  ?HGBA1C 7.3*  ? ?Fasting Lipid Panel ?No results for input(s): CHOL, HDL, LDLCALC, TRIG, CHOLHDL, LDLDIRECT in the last 72 hours. ?Thyroid Function Tests ?Recent Labs  ?  03/06/22 ?0245  ?TSH 2.850  ? ? ?Other results: ? ? ?Imaging  ? ? ?ECHOCARDIOGRAM COMPLETE ? ?Result Date: 03/06/2022 ?   ECHOCARDIOGRAM REPORT   Patient  Name:   Jeffery Villa Date of Exam: 03/06/2022 Medical Rec #:  [id number]          Height:       74.0 in Accession #:    [id number]         Weight:       269.3 lb Date of Birth:  Mar 15, 1986          BSA:          2.466 m? Patient Age:    73 years           BP:           99/66 mmHg Patient Gender: M                  HR:           95 bpm. Exam Location:  Inpatient Procedure: 2D Echo, Cardiac Doppler, Color Doppler and 3D Echo Indications:    CHF  History:        Patient has prior history of Echocardiogram examinations, most                 recent 07/23/2021. CHF, Prior echo done at outside hospital,                 Arrythmias:Palpitations, Signs/Symptoms:Shortness of Breath and                 Edema; Risk Factors:Hypertension.  Sonographer:    Merrie Roof RDCS  Referring Phys: [place]  1. Left ventricular ejection fraction, by estimation, is 20 to 25%. The left ventricle has severely decreased function. The left ventricle demonstrates global hypokinesis. The left ventricular internal cavity size was moderately dilated. There is mild left ventricular hypertrophy. Left ventricular diastolic parameters are indeterminate.  2. Right ventricular systolic function is normal. The right ventricular size is normal. There is moderately elevated pulmonary artery systolic pressure.  3. Left atrial size was moderately dilated.  4. Right atrial size was mildly dilated.  5. Functional MR with tethering of the posterior MV leaflet in setting of systolic dysfunction. . The mitral valve is abnormal. Moderate mitral valve regurgitation. No evidence of mitral stenosis.  6. The tricuspid valve is abnormal. Tricuspid valve regurgitation is moderate.  7. The aortic valve is tricuspid. Aortic valve regurgitation is not visualized. No aortic stenosis is present.  8. The inferior vena cava is dilated in size with <50% respiratory variability, suggesting right atrial pressure of 15 mmHg. FINDINGS  Left Ventricle: Left ventricular ejection fraction, by estimation, is 20 to 25%. The left ventricle has severely decreased function. The left ventricle demonstrates global hypokinesis. The left ventricular internal cavity size was moderately dilated. There is mild left ventricular hypertrophy. Left ventricular diastolic parameters are indeterminate. Right Ventricle: The right ventricular size is normal. Right vetricular wall thickness was not well visualized. Right ventricular systolic function is normal. There is moderately elevated pulmonary artery systolic pressure. The tricuspid regurgitant velocity is 3.03 m/s, and with an assumed right atrial pressure of 15 mmHg, the estimated right ventricular systolic pressure is [id number] mmHg. Left Atrium: Left atrial size was moderately dilated.  Right Atrium: Right atrial size was mildly dilated. Pericardium: There is no evidence of pericardial effusion. Mitral Valve: Functional MR with tethering of the posterior MV leaflet in setting of systolic dysfunction. The mitral valve is abnormal. There is mild thickening of the mitral valve leaflet(s). There is mild calcification of the mitral valve leaflet(s). Mild mitral annular calcification. Moderate  mitral valve regurgitation. No evidence of mitral valve stenosis. Tricuspid Valve: The tricuspid valve is abnormal. Tricuspid valve regurgitation is moderate . No evidence of tricuspid stenosis. Aortic Valve: The aortic valve is tricuspid. Aortic valve regurgitation is not visualized. No aortic stenosis is present. Aortic valve mean gradient measures 3.0 mmHg. Aortic valve peak gradient measures 5.4 mmHg. Aortic valve area, by VTI measures 1.71 cm?. Pulmonic Valve: The pulmonic valve was not well visualized. Pulmonic valve regurgitation is not visualized. No evidence of pulmonic stenosis. Aorta: The aortic root is normal in size and structure. Venous: The inferior vena cava is dilated in size with less than 50% respiratory variability, suggesting right atrial pressure of 15 mmHg. IAS/Shunts: The interatrial septum was not well visualized.  LEFT VENTRICLE PLAX 2D LVIDd:         6.60 cm LVIDs:         5.50 cm LV PW:         1.20 cm LV IVS:        1.10 cm LVOT diam:     2.00 cm      3D Volume EF: LV SV:         31           3D EF:        29 % LV SV Index:   12           LV EDV:       342 ml LVOT Area:     3.14 cm?     LV ESV:       242 ml                             LV SV:        100 ml  LV Volumes (MOD) LV vol d, MOD A2C: 275.0 ml LV vol d, MOD A4C: 240.0 ml LV vol s, MOD A2C: 195.0 ml LV vol s, MOD A4C: 176.0 ml LV SV MOD A2C:     80.0 ml LV SV MOD A4C:     240.0 ml LV SV MOD BP:      69.2 ml RIGHT VENTRICLE             IVC RV S prime:     13.40 cm/s  IVC diam: 2.50 cm TAPSE (M-mode): 2.1 cm LEFT ATRIUM               Index        RIGHT ATRIUM           Index LA diam:        4.60 cm  1.87 cm/m?   RA Area:     33.90 cm? LA Vol (A2C):   134.0 ml 54.33 ml/m?  RA Volume:   134.00 ml 54.33 ml/m? LA Vol (A4C):   102.0 ml 41.36 m

## 2022-03-07 NOTE — Progress Notes (Signed)
Patients am blood sugar was 65 asymptomatic 20 grams of carbohydrates as well as protien given ?

## 2022-03-07 NOTE — Progress Notes (Signed)
Patient ID: Jeffery Villa, male   DOB: 09/06/1986, 36 y.o.   MRN: [id number] ?  ? ? Advanced Heart Failure Rounding Note ? ?PCP-Cardiologist: None  ? ?Subjective:   ? ?Good diuresis yesterday, weight down 5 lbs but creatinine up 2.89 => 3.03.   ? ?He remains on vancomycin/ceftriaxone, WBCs 28 and afebrile.  ? ?Echo reviewed: EF 20-25%, global hypokinesis with moderate LV dilation, mod-severe MR (severe by PISA ERO), moderate TR.  ? ? ?Objective:   ?Weight Range: ?120.1 kg ?Body mass index is 33.99 kg/m?.  ? ?Vital Signs:   ?Temp:  [97.7 ?F (36.5 ?C)-98.7 ?F (37.1 ?C)] 97.7 ?F (36.5 ?C) (05/14 0555) ?Pulse Rate:  [85-106] 95 (05/14 0555) ?Resp:  [18-20] 20 (05/14 0555) ?BP: (99-126)/(66-91) 122/91 (05/14 0555) ?SpO2:  [96 %-100 %] 97 % (05/14 0555) ?Weight:  [120.1 kg] 120.1 kg (05/14 0609) ?Last BM Date : 03/06/22 ? ?Weight change: ?Filed Weights  ? 03/05/22 1228 03/05/22 2150 03/07/22 0609  ?Weight: 122.5 kg 122.2 kg 120.1 kg  ? ? ?Intake/Output:  ? ?Intake/Output Summary (Last 24 hours) at 03/07/2022 0949 ?Last data filed at 03/07/2022 0800 ?Gross per 24 hour  ?Intake 460 ml  ?Output 3775 ml  ?Net -3315 ml  ?  ? ? ?Physical Exam  ?  ?General:  Well appearing. No resp difficulty ?HEENT: Normal ?Neck: Supple. JVP 10-12 cm. Carotids 2+ bilat; no bruits. No lymphadenopathy or thyromegaly appreciated. ?Cor: PMI lateral. Regular rate & rhythm. 2/6 HSM apex.  ?Lungs: Clear ?Abdomen: Soft, nontender, nondistended. No hepatosplenomegaly. No bruits or masses. Good bowel sounds. ?Extremities: No cyanosis, clubbing, rash. 2+ edema left lower leg, erythema lower leg.  1+ edema right lower leg.  ?Neuro: Alert & orientedx3, cranial nerves grossly intact. moves all 4 extremities w/o difficulty. Affect pleasant ? ? ?Telemetry  ? ?NSR 100s (personally reviewed) ? ?Labs  ?  ?CBC ?Recent Labs  ?  03/05/22 ?1249 03/06/22 ?0245 03/07/22 ?0147  ?WBC 30.7* 27.4* 28.4*  ?NEUTROABS 20.7* 23.0*  --   ?HGB 11.6* 11.3* 11.5*  ?HCT 37.5*  36.1* 35.9*  ?MCV 70.6* 70.6* 70.0*  ?PLT 378 340 381  ? ?Basic Metabolic Panel ?Recent Labs  ?  03/06/22 ?0245 03/07/22 ?0147  ?NA 135 136  ?K 4.5 4.3  ?CL 110 108  ?CO2 19* 19*  ?GLUCOSE 144* 57*  ?BUN 61* 60*  ?CREATININE 2.89* 3.03*  ?CALCIUM 8.5* 8.3*  ?MG 2.0  --   ? ?Liver Function Tests ?Recent Labs  ?  03/05/22 ?1443 03/06/22 ?0245  ?AST 37 27  ?ALT 35 28  ?ALKPHOS 65 60  ?BILITOT 0.8 1.0  ?PROT 6.4* 5.6*  ?ALBUMIN 3.4* 2.2*  ? ?No results for input(s): LIPASE, AMYLASE in the last 72 hours. ?Cardiac Enzymes ?Recent Labs  ?  03/05/22 ?1443  ?CKTOTAL 402*  ? ? ?BNP: ?BNP (last 3 results) ?Recent Labs  ?  03/05/22 ?1249  ?BNP 2,958.3*  ? ? ?ProBNP (last 3 results) ?No results for input(s): PROBNP in the last 8760 hours. ? ? ?D-Dimer ?No results for input(s): DDIMER in the last 72 hours. ?Hemoglobin A1C ?Recent Labs  ?  03/06/22 ?0245  ?HGBA1C 7.3*  ? ?Fasting Lipid Panel ?No results for input(s): CHOL, HDL, LDLCALC, TRIG, CHOLHDL, LDLDIRECT in the last 72 hours. ?Thyroid Function Tests ?Recent Labs  ?  03/06/22 ?0245  ?TSH 2.850  ? ? ?Other results: ? ? ?Imaging  ? ? ?ECHOCARDIOGRAM COMPLETE ? ?Result Date: 03/06/2022 ?   ECHOCARDIOGRAM REPORT   Patient   Name:   Jeffery Villa Date of Exam: 03/06/2022 Medical Rec #:  [id number]          Height:       74.0 in Accession #:    [id number]         Weight:       269.3 lb Date of Birth:  Mar 15, 1986          BSA:          2.466 m? Patient Age:    73 years           BP:           99/66 mmHg Patient Gender: M                  HR:           95 bpm. Exam Location:  Inpatient Procedure: 2D Echo, Cardiac Doppler, Color Doppler and 3D Echo Indications:    CHF  History:        Patient has prior history of Echocardiogram examinations, most                 recent 07/23/2021. CHF, Prior echo done at outside hospital,                 Arrythmias:Palpitations, Signs/Symptoms:Shortness of Breath and                 Edema; Risk Factors:Hypertension.  Sonographer:    Merrie Roof RDCS  Referring Phys: [place]  1. Left ventricular ejection fraction, by estimation, is 20 to 25%. The left ventricle has severely decreased function. The left ventricle demonstrates global hypokinesis. The left ventricular internal cavity size was moderately dilated. There is mild left ventricular hypertrophy. Left ventricular diastolic parameters are indeterminate.  2. Right ventricular systolic function is normal. The right ventricular size is normal. There is moderately elevated pulmonary artery systolic pressure.  3. Left atrial size was moderately dilated.  4. Right atrial size was mildly dilated.  5. Functional MR with tethering of the posterior MV leaflet in setting of systolic dysfunction. . The mitral valve is abnormal. Moderate mitral valve regurgitation. No evidence of mitral stenosis.  6. The tricuspid valve is abnormal. Tricuspid valve regurgitation is moderate.  7. The aortic valve is tricuspid. Aortic valve regurgitation is not visualized. No aortic stenosis is present.  8. The inferior vena cava is dilated in size with <50% respiratory variability, suggesting right atrial pressure of 15 mmHg. FINDINGS  Left Ventricle: Left ventricular ejection fraction, by estimation, is 20 to 25%. The left ventricle has severely decreased function. The left ventricle demonstrates global hypokinesis. The left ventricular internal cavity size was moderately dilated. There is mild left ventricular hypertrophy. Left ventricular diastolic parameters are indeterminate. Right Ventricle: The right ventricular size is normal. Right vetricular wall thickness was not well visualized. Right ventricular systolic function is normal. There is moderately elevated pulmonary artery systolic pressure. The tricuspid regurgitant velocity is 3.03 m/s, and with an assumed right atrial pressure of 15 mmHg, the estimated right ventricular systolic pressure is [id number] mmHg. Left Atrium: Left atrial size was moderately dilated.  Right Atrium: Right atrial size was mildly dilated. Pericardium: There is no evidence of pericardial effusion. Mitral Valve: Functional MR with tethering of the posterior MV leaflet in setting of systolic dysfunction. The mitral valve is abnormal. There is mild thickening of the mitral valve leaflet(s). There is mild calcification of the mitral valve leaflet(s). Mild mitral annular calcification. Moderate  mitral valve regurgitation. No evidence of mitral valve stenosis. Tricuspid Valve: The tricuspid valve is abnormal. Tricuspid valve regurgitation is moderate . No evidence of tricuspid stenosis. Aortic Valve: The aortic valve is tricuspid. Aortic valve regurgitation is not visualized. No aortic stenosis is present. Aortic valve mean gradient measures 3.0 mmHg. Aortic valve peak gradient measures 5.4 mmHg. Aortic valve area, by VTI measures 1.71 cm?. Pulmonic Valve: The pulmonic valve was not well visualized. Pulmonic valve regurgitation is not visualized. No evidence of pulmonic stenosis. Aorta: The aortic root is normal in size and structure. Venous: The inferior vena cava is dilated in size with less than 50% respiratory variability, suggesting right atrial pressure of 15 mmHg. IAS/Shunts: The interatrial septum was not well visualized.  LEFT VENTRICLE PLAX 2D LVIDd:         6.60 cm LVIDs:         5.50 cm LV PW:         1.20 cm LV IVS:        1.10 cm LVOT diam:     2.00 cm      3D Volume EF: LV SV:         31           3D EF:        29 % LV SV Index:   12           LV EDV:       342 ml LVOT Area:     3.14 cm?     LV ESV:       242 ml                             LV SV:        100 ml  LV Volumes (MOD) LV vol d, MOD A2C: 275.0 ml LV vol d, MOD A4C: 240.0 ml LV vol s, MOD A2C: 195.0 ml LV vol s, MOD A4C: 176.0 ml LV SV MOD A2C:     80.0 ml LV SV MOD A4C:     240.0 ml LV SV MOD BP:      69.2 ml RIGHT VENTRICLE             IVC RV S prime:     13.40 cm/s  IVC diam: 2.50 cm TAPSE (M-mode): 2.1 cm LEFT ATRIUM               Index        RIGHT ATRIUM           Index LA diam:        4.60 cm  1.87 cm/m?   RA Area:     33.90 cm? LA Vol (A2C):   134.0 ml 54.33 ml/m?  RA Volume:   134.00 ml 54.33 ml/m? LA Vol (A4C):   102.0 ml 41.36 m

## 2022-03-08 ENCOUNTER — Encounter (HOSPITAL_COMMUNITY): Payer: Self-pay | Admitting: Cardiology

## 2022-03-08 ENCOUNTER — Inpatient Hospital Stay (HOSPITAL_COMMUNITY): Payer: 59

## 2022-03-08 ENCOUNTER — Encounter (HOSPITAL_COMMUNITY)
Admission: EM | Disposition: A | Discharge status: Home / Self Care | Payer: Self-pay | Source: Home / Self Care | Attending: Internal Medicine

## 2022-03-08 ENCOUNTER — Other Ambulatory Visit (HOSPITAL_COMMUNITY): Payer: Self-pay

## 2022-03-08 DIAGNOSIS — I509 Heart failure, unspecified: Secondary | ICD-10-CM

## 2022-03-08 DIAGNOSIS — A419 Sepsis, unspecified organism: Secondary | ICD-10-CM | POA: Diagnosis not present

## 2022-03-08 DIAGNOSIS — I340 Nonrheumatic mitral (valve) insufficiency: Secondary | ICD-10-CM

## 2022-03-08 DIAGNOSIS — I5043 Acute on chronic combined systolic (congestive) and diastolic (congestive) heart failure: Secondary | ICD-10-CM | POA: Diagnosis not present

## 2022-03-08 DIAGNOSIS — L0390 Cellulitis, unspecified: Secondary | ICD-10-CM | POA: Diagnosis not present

## 2022-03-08 HISTORY — PX: RIGHT HEART CATH: CATH118263

## 2022-03-08 LAB — GLUCOSE, CAPILLARY
Glucose-Capillary: 50 mg/dL — ABNORMAL LOW (ref 70–99)
Glucose-Capillary: 91 mg/dL (ref 70–99)
Glucose-Capillary: 188 mg/dL — ABNORMAL HIGH (ref 70–99)
Glucose-Capillary: 122 mg/dL — ABNORMAL HIGH (ref 70–99)
Glucose-Capillary: 153 mg/dL — ABNORMAL HIGH (ref 70–99)

## 2022-03-08 LAB — CBC
HCT: 34 % — ABNORMAL LOW (ref 39.0–52.0)
Hemoglobin: 10.5 g/dL — ABNORMAL LOW (ref 13.0–17.0)
MCH: 21.5 pg — ABNORMAL LOW (ref 26.0–34.0)
MCHC: 30.9 g/dL (ref 30.0–36.0)
MCV: 69.7 fL — ABNORMAL LOW (ref 80.0–100.0)
Platelets: 355 10*3/uL (ref 150–400)
RBC: 4.88 MIL/uL (ref 4.22–5.81)
RDW: 19.5 % — ABNORMAL HIGH (ref 11.5–15.5)
WBC: 22.9 10*3/uL — ABNORMAL HIGH (ref 4.0–10.5)
nRBC: 0 % (ref 0.0–0.2)

## 2022-03-08 LAB — BASIC METABOLIC PANEL WITH GFR
Anion gap: 8 (ref 5–15)
BUN: 54 mg/dL — ABNORMAL HIGH (ref 6–20)
CO2: 21 mmol/L — ABNORMAL LOW (ref 22–32)
Calcium: 8.1 mg/dL — ABNORMAL LOW (ref 8.9–10.3)
Chloride: 109 mmol/L (ref 98–111)
Creatinine, Ser: 2.63 mg/dL — ABNORMAL HIGH (ref 0.61–1.24)
GFR, Estimated: 32 mL/min — ABNORMAL LOW (ref >60)
Glucose, Bld: 53 mg/dL — ABNORMAL LOW (ref 70–99)
Potassium: 3.2 mmol/L — ABNORMAL LOW (ref 3.5–5.1)
Sodium: 138 mmol/L (ref 135–145)

## 2022-03-08 LAB — POCT I-STAT EG7
Acid-base deficit: 3 mmol/L — ABNORMAL HIGH (ref 0.0–2.0)
Bicarbonate: 20.5 mmol/L (ref 20.0–28.0)
Calcium, Ion: 1.21 mmol/L (ref 1.15–1.40)
HCT: 36 % — ABNORMAL LOW (ref 39.0–52.0)
Hemoglobin: 12.2 g/dL — ABNORMAL LOW (ref 13.0–17.0)
O2 Saturation: 72 %
Potassium: 3.3 mmol/L — ABNORMAL LOW (ref 3.5–5.1)
Sodium: 140 mmol/L (ref 135–145)
TCO2: 22 mmol/L (ref 22–32)
pCO2, Ven: 32.7 mmHg — ABNORMAL LOW (ref 44–60)
pH, Ven: 7.406 (ref 7.25–7.43)
pO2, Ven: 37 mmHg (ref 32–45)

## 2022-03-08 SURGERY — RIGHT HEART CATH
Anesthesia: LOCAL

## 2022-03-08 MED ORDER — LORAZEPAM 2 MG/ML IJ SOLN: 1.0000 mg | Freq: Once | INTRAMUSCULAR | Status: AC

## 2022-03-08 MED ORDER — POLYETHYLENE GLYCOL 3350 17 G PO PACK
17.0000 g | PACK | Freq: Every day | ORAL | Status: DC | PRN
  Administered 2022-03-08: 17 g via ORAL
  Filled 2022-03-08: qty 1

## 2022-03-08 MED ORDER — LORAZEPAM 2 MG/ML IJ SOLN
INTRAMUSCULAR | Status: AC
Start: 2022-03-08 — End: 2022-03-08
  Administered 2022-03-08: 1 mg via INTRAVENOUS
  Filled 2022-03-08: qty 1

## 2022-03-08 MED ORDER — ALPRAZOLAM 0.5 MG PO TABS
0.5000 mg | ORAL_TABLET | Freq: Once | ORAL | Status: AC
  Administered 2022-03-08: 0.5 mg via ORAL
  Filled 2022-03-08: qty 1

## 2022-03-08 MED ORDER — LIDOCAINE HCL (PF) 1 % IJ SOLN
INTRAMUSCULAR | Status: AC
  Filled 2022-03-08: qty 30

## 2022-03-08 MED ORDER — HEPARIN (PORCINE) IN NACL 1000-0.9 UT/500ML-% IV SOLN
INTRAVENOUS | Status: DC | PRN
  Administered 2022-03-08: 500 mL

## 2022-03-08 MED ORDER — LIDOCAINE HCL (PF) 1 % IJ SOLN
INTRAMUSCULAR | Status: DC | PRN
  Administered 2022-03-08: 10 mL via INTRADERMAL

## 2022-03-08 MED ORDER — ISOSORB DINITRATE-HYDRALAZINE 20-37.5 MG PO TABS
0.5000 | ORAL_TABLET | Freq: Three times a day (TID) | ORAL | Status: DC
  Administered 2022-03-08 – 2022-03-09 (×4): 0.5 via ORAL
  Filled 2022-03-08 (×4): qty 1

## 2022-03-08 MED ORDER — HEPARIN (PORCINE) IN NACL 1000-0.9 UT/500ML-% IV SOLN
INTRAVENOUS | Status: AC
  Filled 2022-03-08: qty 500

## 2022-03-08 MED ORDER — TORSEMIDE 20 MG PO TABS
40.0000 mg | ORAL_TABLET | Freq: Every day | ORAL | Status: DC
Start: 2022-03-08 — End: 2022-03-09
  Administered 2022-03-08 – 2022-03-09 (×2): 40 mg via ORAL
  Filled 2022-03-08 (×2): qty 2

## 2022-03-08 MED ORDER — POTASSIUM CHLORIDE CRYS ER 20 MEQ PO TBCR: 40.0000 meq | EXTENDED_RELEASE_TABLET | Freq: Once | ORAL | Status: DC

## 2022-03-08 MED ORDER — GADOBUTROL 1 MMOL/ML IV SOLN
10.0000 mL | Freq: Once | INTRAVENOUS | Status: AC | PRN
  Administered 2022-03-08: 10 mL via INTRAVENOUS

## 2022-03-08 MED ORDER — DEXTROSE 50 % IV SOLN
INTRAVENOUS | Status: AC
  Administered 2022-03-08: 25 mL
  Filled 2022-03-08: qty 50

## 2022-03-08 MED ORDER — POTASSIUM CHLORIDE CRYS ER 20 MEQ PO TBCR
40.0000 meq | EXTENDED_RELEASE_TABLET | Freq: Once | ORAL | Status: AC
  Administered 2022-03-08: 40 meq via ORAL
  Filled 2022-03-08: qty 2

## 2022-03-08 SURGICAL SUPPLY — 8 items
CATH BALLN WEDGE 5F 110CM (CATHETERS) ×1 IMPLANT
KIT HEART LEFT (KITS) ×2 IMPLANT
PACK CARDIAC CATHETERIZATION (CUSTOM PROCEDURE TRAY) ×2 IMPLANT
PROTECTION STATION PRESSURIZED (MISCELLANEOUS) ×2
SHEATH GLIDE SLENDER 4/5FR (SHEATH) ×1 IMPLANT
STATION PROTECTION PRESSURIZED (MISCELLANEOUS) IMPLANT
TRANSDUCER W/STOPCOCK (MISCELLANEOUS) ×2 IMPLANT
WIRE EMERALD 3MM-J .025X260CM (WIRE) ×1 IMPLANT

## 2022-03-08 NOTE — Progress Notes (Signed)
Pt's am blood sugar was 50 mildly symptomatic  1/2 amp of d50 given as pt is npo recheck of blood sugar was 91 ?

## 2022-03-08 NOTE — TOC Benefit Eligibility Note (Signed)
Patient Advocate Encounter ?  ?Insurance verification completed.   ?  ?The patient is currently admitted and upon discharge could be taking ENTRESTO 24-26 MG. ?  ?The current 30 day co-pay is, $160.  ? ?The patient is currently admitted and upon discharge could be taking BIDIL 20-37.5 MG. ?  ?The current 30 day co-pay is, $155.50.  ? ?The patient is insured through [employer] [facility] (Friday PLAN). ? ? ?   ?

## 2022-03-08 NOTE — Progress Notes (Signed)
?PROGRESS NOTE ? ? ? 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 ?-In the ED he was febrile, tachycardic, WBC was 30 K, creatinine of 2.8, BNP 2958, noted to have 3+ edema, chest x-ray without pulmonary edema, cardiomegaly noted, Dopplers negative for DVT ?-Started on antibiotics, diuretics ?-5/14, started on milrinone ? ?Subjective: ?-Feels better overall, back from right heart cath, breathing almost back to normal, leg swelling is improving ? ?Assessment and Plan: ? ?Sepsis, poa ?Left leg cellulitis ?-Dopplers are negative for DVT, blood cultures negative ?-Continue IV ceftriaxone and vancomycin ?-Leukocytosis improving, CBC in a.m. ? ?Acute on chronic systolic and diastolic CHF ?Moderate to severe MR, TR ?Essential hypertension ?-Echo at [facility] 9/22 noted EF of 20-25% with moderate to severe MR, restrictive pattern, ischemic work-up deferred in the setting of AKI/CKD ?-Unfortunately with AKI in the setting of sepsis and possibly cardiorenal syndrome ?-Right heart cath today with moderate PAH, mildly elevated wedge pressures, diuretics changed to oral torsemide today ?-repeat echo with EF of 20-25%, moderate MR ?-Off milrinone today ?-Appreciate cardiology input, BMP in a.m. ?-TOC and para medicine consult ? ?AKI on CKD 3a ?-Baseline creatinine around 1.9-2.1 ?-Creatinine peaked at 3, now improving, down to 2.6 today ?-Likely multifactorial, sepsis, ACE inhibitor use, cardiorenal ?-Off milrinone today, starting torsemide ? ?Type 2 diabetes mellitus ?-With hypoglycemia, will discontinue glargine, Farxiga and sliding scale, A1c 7.3 ?-Diabetes coordinator consult, was on metformin PTA ? ?OSA ? ?DVT  prophylaxis: Heparin subcutaneous ?Code Status: Full code ?Family Communication: Discussed with patient and wife at bedside ?Disposition Plan: Home likely 48 hours ?Consultants:  ?Cardiology ? ?Procedures:  ? ? ? ?Objective: ?Vitals:  ? 03/08/22 0825 03/08/22 0835 03/08/22 0900 03/08/22 0902  ?BP: (!) 146/96   (!) 144/96  ?Pulse: (!) 0 (!) 0    ?Resp:   (!) 23   ?Temp:    97.7 ?F (36.5 ?C)  ?TempSrc:    Oral  ?SpO2:      ?Weight:      ?Height:      ? ? ?Intake/Output Summary (Last 24 hours) at 03/08/2022 1215 ?Last data filed at 03/08/2022 1135 ?Gross per 24 hour  ?Intake 851.51 ml  ?Output 2850 ml  ?Net -1998.49 ml  ? ?Filed Weights  ? 03/05/22 2150 03/07/22 0609 03/08/22 0341  ?Weight: 122.2 kg 120.1 kg 120.2 kg  ? ? ?Examination: ? ?General exam: Young male sitting up in bed, AAOx3, no distress ?CVS: S1-S2, regular rhythm ?Lungs: Clear bilaterally ?Abdomen: Soft, nontender, bowel sounds present ?Extremities: Trace edema in right leg, 2+ left leg with erythema warmth and tenderness, improving ?Skin: As above ?Psychiatry:  Mood & affect appropriate.  ? ? ? ?Data Reviewed:  ? ?CBC: ?Recent Labs  ?Lab 03/05/22 ?1249 03/06/22 ?9562 03/07/22 ?0147 03/08/22 ?1308 03/08/22 ?0815  ?WBC 30.7* 27.4* 28.4* 22.9*  --   ?NEUTROABS 20.7* 23.0*  --   --   --   ?HGB 11.6* 11.3* 11.5* 10.5* 12.2*  ?HCT 37.5* 36.1* 35.9* 34.0* 36.0*  ?MCV 70.6* 70.6* 70.0* 69.7*  --   ?PLT 378 340 381 355  --   ? ?Basic Metabolic Panel: ?Recent Labs  ?Lab 03/05/22 ?1249 03/06/22 ?6578 03/07/22 ?0147 03/08/22 ?  9470 03/08/22 ?0815  ?NA 137 135 136 138 140  ?K 4.7 4.5 4.3 3.2* 3.3*  ?CL 108 110 108 109  --   ?CO2 18* 19* 19* 21*  --   ?GLUCOSE 144* 144* 57* 53*  --   ?BUN 68* 61* 60* 54*  --   ?CREATININE 2.49* 2.89* 3.03* 2.63*  --   ?CALCIUM 9.1 8.5* 8.3* 8.1*  --   ?MG  --  2.0  --   --   --   ? ?GFR: ?Estimated Creatinine Clearance: 54.5 mL/min (A) (by C-G formula based on SCr of 2.63 mg/dL (H)). ?Liver Function Tests: ?Recent Labs  ?Lab  03/05/22 ?1443 03/06/22 ?0245  ?AST 37 27  ?ALT 35 28  ?ALKPHOS 65 60  ?BILITOT 0.8 1.0  ?PROT 6.4* 5.6*  ?ALBUMIN 3.4* 2.2*  ? ?No results for input(s): LIPASE, AMYLASE in the last 168 hours. ?No results for input(s): AMMONIA in the last 168 hours. ?Coagulation Profile: ?Recent Labs  ?Lab 03/05/22 ?1443  ?INR 1.3*  ? ?Cardiac Enzymes: ?Recent Labs  ?Lab 03/05/22 ?1443  ?CKTOTAL 402*  ? ?BNP (last 3 results) ?No results for input(s): PROBNP in the last 8760 hours. ?HbA1C: ?Recent Labs  ?  03/06/22 ?0245  ?HGBA1C 7.3*  ? ?CBG: ?Recent Labs  ?Lab 03/07/22 ?1314 03/07/22 ?1627 03/07/22 ?2116 03/08/22 ?9628 03/08/22 ?3662  ?GLUCAP 109* 84 151* 50* 91  ? ?Lipid Profile: ?No results for input(s): CHOL, HDL, LDLCALC, TRIG, CHOLHDL, LDLDIRECT in the last 72 hours. ?Thyroid Function Tests: ?Recent Labs  ?  03/06/22 ?0245  ?TSH 2.850  ? ?Anemia Panel: ?No results for input(s): VITAMINB12, FOLATE, FERRITIN, TIBC, IRON, RETICCTPCT in the last 72 hours. ?Urine analysis: ?   ?Component Value Date/Time  ? COLORURINE COLORLESS (A) 03/05/2022 1443  ? APPEARANCEUR CLEAR 03/05/2022 1443  ? LABSPEC 1.007 03/05/2022 1443  ? PHURINE 5.0 03/05/2022 1443  ? GLUCOSEU 500 (A) 03/05/2022 1443  ? HGBUR SMALL (A) 03/05/2022 1443  ? BILIRUBINUR NEGATIVE 03/05/2022 1443  ? KETONESUR NEGATIVE 03/05/2022 1443  ? PROTEINUR 30 (A) 03/05/2022 1443  ? NITRITE NEGATIVE 03/05/2022 1443  ? LEUKOCYTESUR NEGATIVE 03/05/2022 1443  ? ?Sepsis Labs: ?@LABRCNTIP (procalcitonin:4,lacticidven:4) ? ?) ?Recent Results (from the past 240 hour(s))  ?Culture, blood (Routine x 2)     Status: None (Preliminary result)  ? Collection Time: 03/05/22  2:43 PM  ? Specimen: BLOOD RIGHT HAND  ?Result Value Ref Range Status  ? Specimen Description   Final  ?  BLOOD RIGHT HAND ?Performed at 05/05/22, [address] [place], [place] ?  ? Special Requests   Final  ?  BOTTLES DRAWN AEROBIC AND ANAEROBIC Blood Culture adequate volume ?Performed at  [id number], [address] [place], [place] ?  ? Culture   Final  ?  NO GROWTH 3 DAYS ?Performed at [facility], [address], [place], [place] ?  ? Report Status PENDING  Incomplete  ?Resp Panel by RT-PCR (Flu A&B, Covid) Nasopharyngeal Swab     Status: None  ? Collection Time: 03/05/22  6:07 PM  ? Specimen: Nasopharyngeal Swab; Nasopharyngeal(NP) swabs in vial transport medium  ?Result Value Ref Range Status  ? SARS Coronavirus 2 by RT PCR NEGATIVE NEGATIVE Final  ?  Comment: (NOTE) ?SARS-CoV-2 target nucleic acids are NOT DETECTED. ? ?The SARS-CoV-2 RNA is generally detectable in upper respiratory ?specimens during the acute phase of infection. The lowest ?concentration of SARS-CoV-2 viral copies this assay can  detect is ?138 copies/mL. A negative result does not preclude SARS-Cov-2 ?infection and should not be used as the sole basis for treatment or ?other patient management decisions. A negative result may occur with  ?improper specimen collection/handling, submission of specimen other ?than nasopharyngeal swab, presence of viral mutation(s) within the ?areas targeted by this assay, and inadequate number of viral ?copies(<138 copies/mL). A negative result must be combined with ?clinical observations, patient history, and epidemiological ?information. The expected result is Negative. ? ?Fact Sheet for Patients:  ?BloggerCourse.com ? ?Fact Sheet for Healthcare Providers:  ?SeriousBroker.it ? ?This test is no t yet approved or cleared by the [place] FDA and  ?has been authorized for detection and/or diagnosis of SARS-CoV-2 by ?FDA under an Emergency Use Authorization (EUA). This EUA will remain  ?in effect (meaning this test can be used) for the duration of the ?COVID-19 declaration under Section 564(b)(1) of the Act, 21 ?U.S.C.section 360bbb-3(b)(1), unless the authorization is terminated  ?or revoked  sooner.  ? ? ?  ? Influenza A by PCR NEGATIVE NEGATIVE Final  ? Influenza B by PCR NEGATIVE NEGATIVE Final  ?  Comment: (NOTE) ?The Xpert Xpress SARS-CoV-2/FLU/RSV plus assay is intended as an aid ?in the diagnosis of influ

## 2022-03-08 NOTE — Progress Notes (Addendum)
Inpatient Diabetes Program Recommendations ? ?AACE/ADA: New Consensus Statement on Inpatient Glycemic Control (2015) ? ?Target Ranges:  Prepandial:   less than 140 mg/dL ?     Peak postprandial:   less than 180 mg/dL (1-2 hours) ?     Critically ill patients:  140 - 180 mg/dL  ? ?Lab Results  ?Component Value Date  ? GLUCAP 91 03/08/2022  ? HGBA1C 7.3 (H) 03/06/2022  ? ? ?Review of Glycemic Control ? Latest Reference Range & Units 03/07/22 13:14 03/07/22 16:27 03/07/22 21:16 03/08/22 05:53  ?Glucose-Capillary 70 - 99 mg/dL 016 (H) 84 010 (H) 50 (L)  ?(H): Data is abnormally high ?(L): Data is abnormally low ?Diabetes history: Type 2 DM ?Outpatient Diabetes medications: Lantus 60 units QHS (NT), Metformin 1000 mg BID, Farxiga 10 mg QD ?Current orders for Inpatient glycemic control: Semglee 25 units QHS, Novolog 0-15 units TID & HS ? ?Inpatient Diabetes Program Recommendations:   ? ?Consider discontinuing Semglee 25 units QHS.  ?Noted consult, will plan to see.  ? ?Addendum:  ?Spoke with patient's significant other briefly at bedside, however patient was in cath lab, then MRI. Will reattempt on 5/16.  ? ?Thanks, ?Lujean Rave, MSN, RNC-OB ?Diabetes Coordinator ?[phone] (8a-5p) ? ? ? ?

## 2022-03-08 NOTE — Progress Notes (Signed)
Patient ID: Jeffery Villa, male   DOB: [birth year], 36 y.o.   MRN: [id number] ?  ? ? Advanced Heart Failure Rounding Note ? ?PCP-Cardiologist: None  ? ?Subjective:   ? ?Good diuresis again yesterday, I/Os net negative 2058.  He was started on empiric milrinone yesterday.  Creatinine 2.89 => 3.03 => 2.63.   ? ?He remains on vancomycin/ceftriaxone, WBCs 28 => 22.9 and afebrile.  ? ?Echo: EF 20-25%, global hypokinesis with moderate LV dilation, mod-severe MR (severe by PISA ERO), moderate TR.  ? ?RHC Procedural Findings: ?Hemodynamics (mmHg) ?RA mean 8 ?RV 57/12 ?PA 58/27 mean 40 ?PCWP mean 17.  He did not have prominent V-waves ?Oxygen saturations: ?PA 71% ?AO 99% ?Cardiac Output (Fick) 8.14  ?Cardiac Index (Fick) 3.33 ?PVR 2.8 WU ? ? ?Objective:   ?Weight Range: ?120.2 kg ?Body mass index is 34.04 kg/m?.  ? ?Vital Signs:   ?Temp:  [98 ?F (36.7 ?C)-99.2 ?F (37.3 ?C)] 99.2 ?F (37.3 ?C) (05/15 0335) ?Pulse Rate:  [0-118] 0 (05/15 0835) ?Resp:  [15-28] 22 (05/15 0820) ?BP: (128-146)/(84-96) 146/96 (05/15 0825) ?SpO2:  [96 %-100 %] 98 % (05/15 0820) ?Weight:  [120.2 kg] 120.2 kg (05/15 0341) ?Last BM Date : 03/06/22 ? ?Weight change: ?Filed Weights  ? 03/05/22 2150 03/07/22 0609 03/08/22 0341  ?Weight: 122.2 kg 120.1 kg 120.2 kg  ? ? ?Intake/Output:  ? ?Intake/Output Summary (Last 24 hours) at 03/08/2022 0836 ?Last data filed at 03/08/2022 0600 ?Gross per 24 hour  ?Intake 1091.51 ml  ?Output 2575 ml  ?Net -1483.49 ml  ?  ? ? ?Physical Exam  ?  ?General: NAD ?Neck: JVP 8 cm, no thyromegaly or thyroid nodule.  ?Lungs: Clear to auscultation bilaterally with normal respiratory effort. ?CV: Nondisplaced PMI.  Heart regular S1/S2, no S3/S4, 2/6 HSM apex.  Trace right ankle edema.  1+ edema left lower leg.  ?Abdomen: Soft, nontender, no hepatosplenomegaly, no distention.  ?Skin: Erythema left lower leg ?Neurologic: Alert and oriented x 3.  ?Psych: Normal affect. ?Extremities: No clubbing or cyanosis.  ?HEENT: Normal.   ? ? ?Telemetry  ? ?NSR 100s-110s (personally reviewed) ? ?Labs  ?  ?CBC ?Recent Labs  ?  03/05/22 ?1249 03/06/22 ?[id number] 03/07/22 ?0147 03/08/22 ?[id number] 03/08/22 ?0815  ?WBC 30.7* 27.4* 28.4* 22.9*  --   ?NEUTROABS 20.7* 23.0*  --   --   --   ?HGB 11.6* 11.3* 11.5* 10.5* 12.2*  ?HCT 37.5* 36.1* 35.9* 34.0* 36.0*  ?MCV 70.6* 70.6* 70.0* 69.7*  --   ?PLT 378 340 381 355  --   ? ?Basic Metabolic Panel ?Recent Labs  ?  03/06/22 ?[id number] 03/07/22 ?0147 03/08/22 ?[id number] 03/08/22 ?0815  ?NA 135 136 138 140  ?K 4.5 4.3 3.2* 3.3*  ?CL 110 108 109  --   ?CO2 19* 19* 21*  --   ?GLUCOSE 144* 57* 53*  --   ?BUN 61* 60* 54*  --   ?CREATININE 2.89* 3.03* 2.63*  --   ?CALCIUM 8.5* 8.3* 8.1*  --   ?MG 2.0  --   --   --   ? ?Liver Function Tests ?Recent Labs  ?  03/05/22 ?1443 03/06/22 ?0245  ?AST 37 27  ?ALT 35 28  ?ALKPHOS 65 60  ?BILITOT 0.8 1.0  ?PROT 6.4* 5.6*  ?ALBUMIN 3.4* 2.2*  ? ?No results for input(s): LIPASE, AMYLASE in the last 72 hours. ?Cardiac Enzymes ?Recent Labs  ?  03/05/22 ?1443  ?CKTOTAL 402*  ? ? ?BNP: ?BNP (last 3 results) ?Recent  Labs  ?  03/05/22 ?1249  ?BNP 2,958.3*  ? ? ?ProBNP (last 3 results) ?No results for input(s): PROBNP in the last 8760 hours. ? ? ?D-Dimer ?No results for input(s): DDIMER in the last 72 hours. ?Hemoglobin A1C ?Recent Labs  ?  03/06/22 ?0245  ?HGBA1C 7.3*  ? ?Fasting Lipid Panel ?No results for input(s): CHOL, HDL, LDLCALC, TRIG, CHOLHDL, LDLDIRECT in the last 72 hours. ?Thyroid Function Tests ?Recent Labs  ?  03/06/22 ?0245  ?TSH 2.850  ? ? ?Other results: ? ? ?Imaging  ? ? ?CARDIAC CATHETERIZATION ? ?Result Date: 03/08/2022 ?1. Mildly elevated PCWP 2. Mild pulmonary venous hypertension 3. Preserved cardiac output   ? ? ?Medications:   ? ? ?Scheduled Medications: ? ALPRAZolam  0.5 mg Oral Once  ? [MAR Hold] atorvastatin  20 mg Oral Daily  ? [MAR Hold] carvedilol  6.25 mg Oral BID WC  ? [MAR Hold] dapagliflozin propanediol  10 mg Oral Daily  ? [MAR Hold] heparin  5,000 Units Subcutaneous Q8H   ? [MAR Hold] insulin aspart  0-15 Units Subcutaneous TID WC  ? [MAR Hold] insulin aspart  0-5 Units Subcutaneous QHS  ? [MAR Hold] insulin glargine-yfgn  25 Units Subcutaneous QHS  ? isosorbide-hydrALAZINE  0.5 tablet Oral TID  ? [MAR Hold] potassium chloride  40 mEq Oral Once  ? potassium chloride  40 mEq Oral Once  ? [MAR Hold] sodium chloride flush  3 mL Intravenous Q12H  ? torsemide  40 mg Oral Daily  ? ? ?Infusions: ? [MAR Hold] sodium chloride    ? sodium chloride    ? sodium chloride 10 mL/hr at 03/08/22 0518  ? [MAR Hold] cefTRIAXone (ROCEPHIN)  IV Stopped (03/07/22 1338)  ? [MAR Hold] vancomycin 1,250 mg (03/08/22 0100)  ? ? ?PRN Medications: ?[MAR Hold] sodium chloride, sodium chloride, [MAR Hold] acetaminophen **OR** [MAR Hold] acetaminophen, [MAR Hold] guaiFENesin-dextromethorphan, [MAR Hold] HYDROcodone-acetaminophen, sodium chloride flush ? ? ?Assessment/Plan  ? ?1. Acute cellulitis: RLE cellulitis, PCT 9.15, WBCs 28, now afebrile.  Lower extremity venous dopplers negative for DVT.  ?- Continue vancomycin/Zosyn per primary team.  ?2. Type 2 diabetes: Early onset, on metformin at home.  ?3. HTN: Early onset in teens.  ?4. Hyperlipidemia: On atorvastatin.  ?5. Acute on chronic systolic CHF: Cardiomyopathy of uncertain etiology.  Echo in 9/22 with EF 20-25%, moderate-severe MR, moderate-severe TR.  Echo this admission with EF 20-25%, global hypokinesis with moderate LV dilation, mod-severe MR (severe by PISA ERO), moderate TR.  He has had HTN since his teens, could be due to long-standing poorly controlled HTN. With DM/HTN/hyperlipidemia x years, CAD must be a consideration though no chest pain. Prior viral myocarditis is possible as well. RHC today showed mildly elevated PCWP and normal RA pressure with mild pulmonary venous hypertension, CI 3.33 (off milrinone for about an hour).  Patient with AKI on CKD and creatinine 2.89 => 3.03 => 2.63 currently from baseline around 2.  He was on empiric milrinone  yesterday, stopped this morning.  Good diuresis yesterday.  Exam improved and he reports breathing is at baseline.   ?- With good cardiac output, will leave off milrinone.    ?- Start torsemide 40 mg daily.  ?- Continue Coreg 6.25 mg bid, can increased prior to discharge depending on BP (was on 25 mg bid at home).   ?- Start Bidil 0.5 tab tid and titrate up as able.  ?- Holding dapagliflozin with AKI, hopefully restart.  ?- Hold ACEI, eventually would like him  on Entresto if creatinine comes down.  ?- Hold spironolactone while creatinine up.  ?- Ideally, would have coronary angiography but need improved creatinine.  At some point, will need ischemic evaluation.  ?- I will arrange for cardiac MRI to assess for infiltrative disease, myocarditis, prior MRI.  Will need Xanax for anti-anxiety.  ?- Narrow QRS, not CRT candidate.  ?6. AKI on CKD stage 3: Suspect baseline diabetic nephropathy.  ?Acute rise in setting of sepsis related to cellulitis.  Also concern for cardiorenal syndrome.  BP stable. RHC with mildly elevated filling pressures and preserved cardiac output. Creatinine trending down, 2.63.  ?- Can stay off milrinone.  ?7. Diabetic foot s/p toe amputation.  ?8. Mitral regurgitation: Possibly severe on echo.  Most likely functional.   ?- Will need eventual evaluation by TEE and consideration for Mitraclip if this does not improve with medical management.  ?- Will try to quantify MR on cMRI today.  ? ?Length of Stay: 2 ? ?Loralie Champagne, MD  ?03/08/2022, 8:36 AM ? ?Advanced Heart Failure Team ?Pager [phone] (M-F; 7a - 5p)  ?Please contact [facility] for night-coverage after hours (5p -7a ) and weekends on [email or web address] ?  ?

## 2022-03-08 NOTE — Interval H&P Note (Signed)
History and Physical Interval Note: ? ?03/08/2022 ?7:58 AM ? ?Jeffery Villa  has presented today for surgery, with the diagnosis of HF.  The various methods of treatment have been discussed with the patient and family. After consideration of risks, benefits and other options for treatment, the patient has consented to  Procedure(s): ?RIGHT HEART CATH (N/A) as a surgical intervention.  The patient's history has been reviewed, patient examined, no change in status, stable for surgery.  I have reviewed the patient's chart and labs.  Questions were answered to the patient's satisfaction.   ? ? ?[name] Shirlee Latch ? ? ?

## 2022-03-09 ENCOUNTER — Other Ambulatory Visit (HOSPITAL_COMMUNITY): Payer: Self-pay

## 2022-03-09 DIAGNOSIS — I5043 Acute on chronic combined systolic (congestive) and diastolic (congestive) heart failure: Secondary | ICD-10-CM | POA: Diagnosis not present

## 2022-03-09 DIAGNOSIS — L0390 Cellulitis, unspecified: Secondary | ICD-10-CM | POA: Diagnosis not present

## 2022-03-09 DIAGNOSIS — A419 Sepsis, unspecified organism: Secondary | ICD-10-CM | POA: Diagnosis not present

## 2022-03-09 LAB — BASIC METABOLIC PANEL
Anion gap: 6 (ref 5–15)
BUN: 48 mg/dL — ABNORMAL HIGH (ref 6–20)
CO2: 22 mmol/L (ref 22–32)
Calcium: 8.1 mg/dL — ABNORMAL LOW (ref 8.9–10.3)
Chloride: 108 mmol/L (ref 98–111)
Creatinine, Ser: 2.43 mg/dL — ABNORMAL HIGH (ref 0.61–1.24)
GFR, Estimated: 35 mL/min — ABNORMAL LOW (ref >60)
Glucose, Bld: 110 mg/dL — ABNORMAL HIGH (ref 70–99)
Potassium: 4.3 mmol/L (ref 3.5–5.1)
Sodium: 136 mmol/L (ref 135–145)

## 2022-03-09 LAB — CBC
HCT: 32.5 % — ABNORMAL LOW (ref 39.0–52.0)
Hemoglobin: 10.3 g/dL — ABNORMAL LOW (ref 13.0–17.0)
MCH: 22.1 pg — ABNORMAL LOW (ref 26.0–34.0)
MCHC: 31.7 g/dL (ref 30.0–36.0)
MCV: 69.7 fL — ABNORMAL LOW (ref 80.0–100.0)
Platelets: 328 10*3/uL (ref 150–400)
RBC: 4.66 MIL/uL (ref 4.22–5.81)
RDW: 19.6 % — ABNORMAL HIGH (ref 11.5–15.5)
WBC: 16.4 10*3/uL — ABNORMAL HIGH (ref 4.0–10.5)
nRBC: 0 % (ref 0.0–0.2)

## 2022-03-09 LAB — POCT I-STAT EG7
Acid-base deficit: 3 mmol/L — ABNORMAL HIGH (ref 0.0–2.0)
Bicarbonate: 20.5 mmol/L (ref 20.0–28.0)
Calcium, Ion: 1.24 mmol/L (ref 1.15–1.40)
HCT: 36 % — ABNORMAL LOW (ref 39.0–52.0)
Hemoglobin: 12.2 g/dL — ABNORMAL LOW (ref 13.0–17.0)
O2 Saturation: 70 %
Potassium: 3.3 mmol/L — ABNORMAL LOW (ref 3.5–5.1)
Sodium: 140 mmol/L (ref 135–145)
TCO2: 22 mmol/L (ref 22–32)
pCO2, Ven: 32.9 mmHg — ABNORMAL LOW (ref 44–60)
pH, Ven: 7.403 (ref 7.25–7.43)
pO2, Ven: 36 mmHg (ref 32–45)

## 2022-03-09 LAB — GLUCOSE, CAPILLARY
Glucose-Capillary: 103 mg/dL — ABNORMAL HIGH (ref 70–99)
Glucose-Capillary: 146 mg/dL — ABNORMAL HIGH (ref 70–99)

## 2022-03-09 MED ORDER — ATORVASTATIN CALCIUM 40 MG PO TABS
40.0000 mg | ORAL_TABLET | Freq: Every day | ORAL | 0 refills | Status: DC
  Filled 2022-03-09: qty 30, 30d supply, fill #0

## 2022-03-09 MED ORDER — POTASSIUM CHLORIDE CRYS ER 10 MEQ PO TBCR
10.0000 meq | EXTENDED_RELEASE_TABLET | Freq: Every day | ORAL | 0 refills | Status: DC
  Filled 2022-03-09: qty 30, 30d supply, fill #0

## 2022-03-09 MED ORDER — ISOSORB DINITRATE-HYDRALAZINE 20-37.5 MG PO TABS
1.0000 | ORAL_TABLET | Freq: Three times a day (TID) | ORAL | Status: DC
  Administered 2022-03-09: 1 via ORAL
  Filled 2022-03-09: qty 1

## 2022-03-09 MED ORDER — ISOSORB DINITRATE-HYDRALAZINE 20-37.5 MG PO TABS
1.0000 | ORAL_TABLET | Freq: Three times a day (TID) | ORAL | 0 refills | Status: DC
  Filled 2022-03-09: qty 90, 30d supply, fill #0

## 2022-03-09 MED ORDER — ASPIRIN 81 MG PO TBEC
81.0000 mg | DELAYED_RELEASE_TABLET | Freq: Every day | ORAL | 0 refills | Status: AC
  Filled 2022-03-09: qty 30, 30d supply, fill #0

## 2022-03-09 MED ORDER — HYDRALAZINE HCL 25 MG PO TABS
25.0000 mg | ORAL_TABLET | Freq: Three times a day (TID) | ORAL | 0 refills | Status: DC
  Filled 2022-03-09: qty 90, 30d supply, fill #0

## 2022-03-09 MED ORDER — TORSEMIDE 20 MG PO TABS
40.0000 mg | ORAL_TABLET | Freq: Every day | ORAL | 0 refills | Status: DC
  Filled 2022-03-09: qty 60, 30d supply, fill #0

## 2022-03-09 MED ORDER — GLIPIZIDE 5 MG PO TABS
5.0000 mg | ORAL_TABLET | Freq: Every day | ORAL | 0 refills | Status: DC
  Filled 2022-03-09: qty 30, 30d supply, fill #0

## 2022-03-09 MED ORDER — ACETAMINOPHEN 325 MG PO TABS: 650.0000 mg | ORAL_TABLET | Freq: Four times a day (QID) | ORAL | Status: DC | PRN

## 2022-03-09 MED ORDER — ATORVASTATIN CALCIUM 40 MG PO TABS: 40.0000 mg | ORAL_TABLET | Freq: Every day | ORAL | Status: DC

## 2022-03-09 MED ORDER — BLOOD GLUCOSE METER KIT: PACK | 0 refills | Status: DC

## 2022-03-09 MED ORDER — CEPHALEXIN 250 MG PO CAPS
250.0000 mg | ORAL_CAPSULE | Freq: Three times a day (TID) | ORAL | 0 refills | Status: AC
Start: 2022-03-09 — End: 2022-03-14
  Filled 2022-03-09: qty 15, 5d supply, fill #0

## 2022-03-09 MED ORDER — CARVEDILOL 12.5 MG PO TABS: 12.5000 mg | ORAL_TABLET | Freq: Two times a day (BID) | ORAL | Status: DC

## 2022-03-09 MED ORDER — ISOSORBIDE MONONITRATE ER 30 MG PO TB24
30.0000 mg | ORAL_TABLET | Freq: Every day | ORAL | 0 refills | Status: DC
  Filled 2022-03-09: qty 30, 30d supply, fill #0

## 2022-03-09 MED ORDER — CARVEDILOL 25 MG PO TABS: 12.5000 mg | ORAL_TABLET | Freq: Two times a day (BID) | ORAL | Status: DC

## 2022-03-09 NOTE — Progress Notes (Signed)
Patient complains of chronic cough. PRN Robitussin doses being given. Pt states cough is mostly dry but occasionally productive with clear phlegm. States he has been experiencing this cough for a couple of months.  ?

## 2022-03-09 NOTE — Discharge Summary (Addendum)
Physician Discharge Summary  ?Jeffery Villa EXN:[id number] DOB: 25-Apr-1986 DOA: 03/05/2022 ? ?PCP: Patient, [name] ? ?Admit date: 03/05/2022 ?Discharge date: 03/09/2022 ? ?Time spent: 45 minutes ? ?Recommendations for Outpatient Follow-up:  ?New PCP in 1 to 2 weeks ?Heart failure clinic in 1 week-Cardiolite versus LHC, repeat echo in few months to reassess mitral regurg ?Needs BMP in 1 week ?Nephrology referral sent ? ? ?Discharge Diagnoses:  ?Principal Problem: ?  Sepsis due to cellulitis [facility]) ?Acute on chronic systolic CHF ?  Acute renal failure superimposed on stage 3a chronic kidney disease ([place]) ?  Uncontrolled type 2 diabetes mellitus with hyperglycemia, with long-term current use of insulin ([place]) ?  BMI 33.0-33.9,adult ?  Cardiac LV ejection fraction of 20-34% ?  Hyperlipidemia associated with type 2 diabetes mellitus ([place]) ?  Hypertension associated with diabetes ([place]) ?  Moderate mitral regurgitation ?  Moderate tricuspid regurgitation ?  Stage 3a chronic kidney disease ([place]) ?  Chronic systolic CHF (congestive heart failure) ([place]) ? ? ?Discharge Condition: Stable ? ?Diet recommendation: Low-sodium, heart healthy ? ?Filed Weights  ? 03/05/22 2150 03/07/22 0609 03/08/22 0341  ?Weight: 122.2 kg 120.1 kg 120.2 kg  ? ? ?History of present illness:  ?36/M with history of hypertension, type 2 diabetes mellitus, CKD 3a, OSA, chronic systolic CHF with WH<67%, seen by [facility] cardiology twice in 2022, presented to the ED with 3 to 4 weeks of edema, weight gain and increased pain in tenderness in his left leg. ?-In the ED he was febrile, tachycardic, WBC was 30 K, creatinine of 2.8, BNP 2958, noted to have 3+ edema, chest x-ray without pulmonary edema, cardiomegaly noted, Dopplers negative for DVT ?-Started on antibiotics, diuretics ? ?Hospital Course: \ ? ?Sepsis, poa ?Left leg cellulitis ?-Dopplers negative for DVT, blood cultures negative ?-Improving on ceftriaxone and vancomycin ?-Leukocytosis  improving, transition to oral Keflex at discharge for 5 more days ?  ?Acute on chronic systolic and diastolic CHF ?Moderate to severe MR, TR ?Essential hypertension ?-Echo at [facility] 9/22 noted EF of 20-25% with moderate to severe MR, restrictive pattern, ischemic work-up deferred in the setting of AKI/CKD ?-Unfortunately with AKI in the setting of sepsis and possibly cardiorenal syndrome ?-Right heart cath 5/15 noted moderate PAH, mildly elevated wedge pressures, diuretics changed to oral torsemide yesterday ?-repeat echo with EF of 20-25%, moderate MR ?-Cardiac MRI with biventricular heart failure,'s difficult study, suspected coronary pattern LGE and anteroseptal wall ?-Off milrinone now and stable, creatinine down to 2.4 ?-Continue carvedilol, BiDil, torsemide 40 Mg daily at discharge ?-Further med titration as outpatient depending on kidney function ?-Discharged home in stable condition, follow-up in heart failure clinic ?-Seen by [facility] and social work, needs PCP and medication assistance ?Addendum: bidil changed to imdur+hydralazine due to cost ?  ?AKI on CKD 3a ?-Baseline creatinine around 1.9-2.1 ?-Creatinine peaked at 3, now improving, down to 2.6 today ?-Likely multifactorial, sepsis, ACE inhibitor use, cardiorenal ?-Off milrinone today, starting torsemide ?  ?Type 2 diabetes mellitus ?-With hypoglycemia, discontinued glargine, Farxiga  ?-hbA1c 7.3 ?-Diabetes coordinator consult, was on metformin PTA ?-History of toe amputation, metformin discontinued on account of CKD, started on glipizide at discharge ?  ? ?Procedures: ?Cardiac MRI ?1. Technically difficult study due to poor breath-holding and ?inadequate images.  ?2.  Severe LV dilation with diffuse hypokinesis, EF 15%.  ?3.  Severe RV dilation with severe hypokinesis, EF 15%.  ?4. Mitral regurgitation looks at least moderate, but regurgitant ?fraction 13% suggests mild MR. Suspect calculation is  inaccurate ?given poor images generally.  ?5. >50% wall  thickness subendocardial LGE in the mid anteroseptal ?wall, looks like a coronary disease pattern suggestive of prior MI. ?  ?[place] Procedural Findings: 5/15 ?Hemodynamics (mmHg) ?RA mean 8 ?RV 57/12 ?PA 58/27 mean 40 ?PCWP mean 17 ?Oxygen saturations: ?PA 71% ?AO 99% ?Cardiac Output (Fick) 8.14  ?Cardiac Index (Fick) 3.33 ?PVR 2.8 WU ? ? ?Consultations: ?Heart failure team ? ?Discharge Exam: ?Vitals:  ? 03/09/22 0754 03/09/22 1109  ?BP: (!) 127/91 103/73  ?Pulse: (!) 107 (!) 106  ?Resp: (!) 27 18  ?Temp: 98.2 ?F (36.8 ?C) 98.2 ?F (36.8 ?C)  ?SpO2: 96% 97%  ? ?General exam: Young male sitting up in bed, AAOx3, no distress ?CVS: S1-S2, regular rhythm ?Lungs: Clear bilaterally ?Abdomen: Soft, nontender, bowel sounds present ?Extremities: Trace edema in right leg, 1+ left leg with erythema warmth and tenderness, improving ?Skin: As above ?Psychiatry:  Mood & affect appropriate.  ?  ? ?Discharge Instructions ? ? ?Discharge Instructions   ? ? Ambulatory referral to Nephrology   Complete by: As directed ?  ? AKi on CKD 3b  ? Diet - low sodium heart healthy   Complete by: As directed ?  ? Increase activity slowly   Complete by: As directed ?  ? ?  ? ?Allergies as of 03/09/2022   ?No Known Allergies ?  ? ?  ?Medication List  ?  ? ?STOP taking these medications   ? ?amLODipine 10 MG tablet ?Commonly known as: NORVASC ?  ?amLODipine-benazepril 10-40 MG capsule ?Commonly known as: LOTREL ?  ?Farxiga 10 MG Tabs tablet ?Generic drug: dapagliflozin propanediol ?  ?furosemide 40 MG tablet ?Commonly known as: LASIX ?  ?insulin glargine 100 UNIT/ML Solostar Pen ?Commonly known as: Lantus SoloStar ?  ?lisinopril 20 MG tablet ?Commonly known as: ZESTRIL ?  ?metFORMIN 1000 MG tablet ?Commonly known as: Glucophage ?  ?rosuvastatin 20 MG tablet ?Commonly known as: CRESTOR ?  ? ?  ? ?TAKE these medications   ? ?acetaminophen 325 MG tablet ?Commonly known as: TYLENOL ?Take 2 tablets (650 mg total) by mouth every 6 (six) hours as needed for  mild pain (or Fever >/= 101). ?  ?Aspirin Low Dose 81 MG EC tablet ?Generic drug: aspirin ?Take 1 tablet (81 mg total) by mouth daily. ?  ?atorvastatin 40 MG tablet ?Commonly known as: LIPITOR ?Take 1 tablet (40 mg total) by mouth daily. ?What changed:  ?medication strength ?how much to take ?  ?blood glucose meter kit and supplies ?Dispense based on patient and insurance preference. Use up to four times daily as directed. (FOR ICD-10 E10.9, E11.9). ?  ?carvedilol 25 MG tablet ?Commonly known as: COREG ?Take 0.5 tablets (12.5 mg total) by mouth 2 (two) times daily. ?What changed: how much to take ?  ?cephALEXin 250 MG capsule ?Commonly known as: KEFLEX ?Take 1 capsule (250 mg total) by mouth 3 (three) times daily for 5 days. ?  ?glipiZIDE 5 MG tablet ?Commonly known as: GLUCOTROL ?Take 1 tablet (5 mg total) by mouth daily before breakfast. ?  ?hydrALAZINE 25 MG tablet ?Commonly known as: APRESOLINE ?Take 1 tablet (25 mg total) by mouth 3 (three) times daily. ?  ?Insulin Pen Needle 30G X 5 MM Misc ?New needle with every insulin adminsration ?  ?isosorbide mononitrate 30 MG 24 hr tablet ?Commonly known as: IMDUR ?Take 1 tablet (30 mg total) by mouth daily. ?  ?potassium chloride 10 MEQ tablet ?Commonly known as: KLOR-CON M ?Take 1  tablet (10 mEq total) by mouth daily. ?  ?torsemide 20 MG tablet ?Commonly known as: DEMADEX ?Take 2 tablets (40 mg total) by mouth daily. ?Start taking on: Mar 10, 2022 ?  ? ?  ? ?No Known Allergies ? ? ? ?The results of significant diagnostics from this hospitalization (including imaging, microbiology, ancillary and laboratory) are listed below for reference.   ? ?Significant Diagnostic Studies: ?DG Chest 2 View ? ?Result Date: 03/05/2022 ?CLINICAL DATA:  Left leg swelling and pain.  History of CHF. EXAM: CHEST - 2 VIEW COMPARISON:  None FINDINGS: Heart size and mediastinal contour scratch set heart size is enlarged. No signs of pleural effusion or edema. No airspace opacities identified.  Faint nodular density within the projection of the right upper lobe may reflect something external to the patient. Visualized osseous structures are unremarkable. IMPRESSION: 1. Cardiac enlargement. 2. No heart

## 2022-03-09 NOTE — Progress Notes (Addendum)
? ? Advanced Heart Failure Rounding Note ? ?PCP-Cardiologist: None  ? ?Subjective:   ? ?1.8L in UOP yesterday w/ torsemide.  ? ?SCr continues to improve, 3.03>>2.63>>2.43 ? ?Remains on abx for cellulitis. WBC improving 22>>16K. BCx NGTD  ? ?Feeling better. Denies resting dyspnea. No further orthopnea. Not SOB ambulating around room.  ? ?cMRI completed yesterday  ?1. Technically difficult study due to poor breath-holding and ?inadequate images. ?  ?2.  Severe LV dilation with diffuse hypokinesis, EF 15%. ?  ?3.  Severe RV dilation with severe hypokinesis, EF 15%. ?  ?4. Mitral regurgitation looks at least moderate, but regurgitant ?fraction 13% suggests mild MR. Suspect calculation is inaccurate ?given poor images generally. ?  ?5. >50% wall thickness subendocardial LGE in the mid anteroseptal ?wall, looks like a coronary disease pattern suggestive of prior MI. ? ?RHC Procedural Findings: ?Hemodynamics (mmHg) ?RA mean 8 ?RV 57/12 ?PA 58/27 mean 40 ?PCWP mean 17 ?Oxygen saturations: ?PA 71% ?AO 99% ?Cardiac Output (Fick) 8.14  ?Cardiac Index (Fick) 3.33 ?PVR 2.8 WU ? ? ?Objective:   ?Weight Range: ?120.2 kg ?Body mass index is 34.04 kg/m?.  ? ?Vital Signs:   ?Temp:  [98.2 ?F (36.8 ?C)-98.8 ?F (37.1 ?C)] 98.2 ?F (36.8 ?C) (05/16 0754) ?Pulse Rate:  [96-112] 107 (05/16 0754) ?Resp:  [16-27] 27 (05/16 0754) ?BP: (112-130)/(72-91) 127/91 (05/16 0754) ?SpO2:  [96 %-100 %] 96 % (05/16 0754) ?Last BM Date : 03/04/22 ? ?Weight change: ?Filed Weights  ? 03/05/22 2150 03/07/22 0609 03/08/22 0341  ?Weight: 122.2 kg 120.1 kg 120.2 kg  ? ? ?Intake/Output:  ? ?Intake/Output Summary (Last 24 hours) at 03/09/2022 0913 ?Last data filed at 03/08/2022 1800 ?Gross per 24 hour  ?Intake --  ?Output 1450 ml  ?Net -1450 ml  ?  ? ? ?Physical Exam  ?  ?General:  Well appearing, moderately obese. No resp difficulty ?HEENT: Normal ?Neck: Supple. JVP not elevated . Carotids 2+ bilat; no bruits. No lymphadenopathy or thyromegaly appreciated. ?Cor:  PMI nondisplaced. Regular rhythm, tachy rate. 2/6 MR murmur  ?Lungs: Clear ?Abdomen: Soft, nontender, nondistended. No hepatosplenomegaly. No bruits or masses. Good bowel sounds. ?Extremities: No cyanosis, clubbing, rash, edema + LLE cellulitis  ?Neuro: Alert & orientedx3, cranial nerves grossly intact. moves all 4 extremities w/o difficulty. Affect pleasant ? ? ?Telemetry  ? ?Sinus tach low 100s  ? ?EKG  ?  ?N/A  ? ?Labs  ?  ?CBC ?Recent Labs  ?  03/08/22 ?[id number] 03/08/22 ?[id number] 03/09/22 ?0820  ?WBC 22.9*  --  16.4*  ?HGB 10.5* 12.2*  12.2* 10.3*  ?HCT 34.0* 36.0*  36.0* 32.5*  ?MCV 69.7*  --  69.7*  ?PLT 355  --  328  ? ?Basic Metabolic Panel ?Recent Labs  ?  03/08/22 ?[id number] 03/08/22 ?[id number] 03/09/22 ?0820  ?NA 138 140  140 136  ?K 3.2* 3.3*  3.3* 4.3  ?CL 109  --  108  ?CO2 21*  --  22  ?GLUCOSE 53*  --  110*  ?BUN 54*  --  48*  ?CREATININE 2.63*  --  2.43*  ?CALCIUM 8.1*  --  8.1*  ? ?Liver Function Tests ?No results for input(s): AST, ALT, ALKPHOS, BILITOT, PROT, ALBUMIN in the last 72 hours. ?No results for input(s): LIPASE, AMYLASE in the last 72 hours. ?Cardiac Enzymes ?No results for input(s): CKTOTAL, CKMB, CKMBINDEX, TROPONINI in the last 72 hours. ? ?BNP: ?BNP (last 3 results) ?Recent Labs  ?  03/05/22 ?1249  ?BNP 2,958.3*  ? ? ?ProBNP (  last 3 results) ?No results for input(s): PROBNP in the last 8760 hours. ? ? ?D-Dimer ?No results for input(s): DDIMER in the last 72 hours. ?Hemoglobin A1C ?No results for input(s): HGBA1C in the last 72 hours. ?Fasting Lipid Panel ?No results for input(s): CHOL, HDL, LDLCALC, TRIG, CHOLHDL, LDLDIRECT in the last 72 hours. ?Thyroid Function Tests ?No results for input(s): TSH, T4TOTAL, T3FREE, THYROIDAB in the last 72 hours. ? ?Invalid input(s): FREET3 ? ?Other results: ? ? ?Imaging  ? ? ?MR CARDIAC MORPHOLOGY W WO CONTRAST ? ?Result Date: 03/08/2022 ?CLINICAL DATA:  Cardiomyopathy of uncertain etiology EXAM: CARDIAC MRI TECHNIQUE: The patient was scanned on a 1.5 Tesla GE  magnet. A dedicated cardiac coil was used. Functional imaging was done using Fiesta sequences. 2,3, and 4 chamber views were done to assess for RWMA's. Modified Simpson's rule using a short axis stack was used to calculate an ejection fraction on a dedicated work Conservation officer, nature. The patient received 8 cc of Gadavist. After 10 minutes inversion recovery sequences were used to assess for infiltration and scar tissue. FINDINGS: Limited images of the lung fields showed no gross abnormalities. Difficult images due to problems with breath-holding. Small primarily inferior pericardial effusion. Severely dilated left ventricle with normal wall thickness. Diffuse hypokinesis, EF 15%. Severely dilated RV, severe hypokinesis with EF 15%. Trileaflet aortic valve without significant stenosis or regurgitation. At least moderate tricuspid regurgitation. Visually, mitral regurgitation looks at least moderate. Regurgitant fraction calculates to 13%, but I suspect this is inaccurate due to poor images. Extremely difficult delayed enhancement images. There is subendocardial late gadolinium enhancement (LGE) in the mid anteroseptal wall, >50% wall thickness. MEASUREMENTS: MEASUREMENTS LVEDV 510 mL LVEDVi 203 mL/m2 LVSV 75 mL LVEF 15% RVEDV 478 mL RVEDVi 191 mL/m2 RVSV 74 mL RVEF 15% Aortic forward volume 65 mL IMPRESSION: 1. Technically difficult study due to poor breath-holding and inadequate images. 2.  Severe LV dilation with diffuse hypokinesis, EF 15%. 3.  Severe RV dilation with severe hypokinesis, EF 15%. 4. Mitral regurgitation looks at least moderate, but regurgitant fraction 13% suggests mild MR. Suspect calculation is inaccurate given poor images generally. 5. >50% wall thickness subendocardial LGE in the mid anteroseptal wall, looks like a coronary disease pattern suggestive of prior MI. Talvin Christianson Electronically Signed   By: Loralie Champagne M.D.   On: 03/08/2022 17:41   ? ? ?Medications:   ? ? ?Scheduled  Medications: ? atorvastatin  20 mg Oral Daily  ? carvedilol  6.25 mg Oral BID WC  ? heparin  5,000 Units Subcutaneous Q8H  ? insulin aspart  0-15 Units Subcutaneous TID WC  ? insulin aspart  0-5 Units Subcutaneous QHS  ? isosorbide-hydrALAZINE  0.5 tablet Oral TID  ? potassium chloride  40 mEq Oral Once  ? sodium chloride flush  3 mL Intravenous Q12H  ? torsemide  40 mg Oral Daily  ? ? ?Infusions: ? sodium chloride    ? cefTRIAXone (ROCEPHIN)  IV 2 g (03/08/22 1135)  ? vancomycin 1,250 mg (03/09/22 0025)  ? ? ?PRN Medications: ?sodium chloride, acetaminophen **OR** acetaminophen, guaiFENesin-dextromethorphan, HYDROcodone-acetaminophen, polyethylene glycol ? ? ? ?Assessment/Plan  ? ?1. Acute cellulitis: RLE cellulitis, PCT 9.15, WBCs 28, now afebrile.  Lower extremity venous dopplers negative for DVT.  ?- Continue vancomycin/ceftriaxone per primary team.  ?2. Type 2 diabetes: Early onset, on metformin at home.  ?3. HTN: Early onset in teens.  ?4. Hyperlipidemia: On atorvastatin.  ?5. Acute on chronic systolic CHF: Cardiomyopathy of uncertain etiology.  Echo in 9/22 with EF 20-25%, moderate-severe MR, moderate-severe TR.  Echo this admission with EF 20-25%, global hypokinesis with moderate LV dilation, mod-severe MR (severe by PISA ERO), moderate TR.  He has had HTN since his teens, could be due to long-standing poorly controlled HTN. With DM/HTN/hyperlipidemia x years, CAD must be a consideration though no chest pain. Prior viral myocarditis is possible as well. RHC this admit showed mildly elevated PCWP and normal RA pressure with mild pulmonary venous hypertension, CI 3.33 (off milrinone for about an hour).  Patient with AKI on CKD and creatinine 2.89 => 3.03 => 2.63=>2.43 currently from baseline around 2.  Good diuresis. Wt down 5 lb.  Exam improved and he reports breathing is at baseline.   ?- Continue Torsemide 40 mg daily  ?- With good cardiac output, will leave off milrinone.    ?- Increase Coreg to 12.5 mg  bid (was on 25 mg bid at home).   ?- Continue Bidil 0.5 tab tid and titrate up as able.  ?- Holding dapagliflozin with AKI, hopefully restart.  ?- Hold ACEI, eventually would like him on Entresto if creatin

## 2022-03-09 NOTE — Progress Notes (Signed)
Inpatient Diabetes Program Recommendations ? ?AACE/ADA: New Consensus Statement on Inpatient Glycemic Control (2015) ? ?Target Ranges:  Prepandial:   less than 140 mg/dL ?     Peak postprandial:   less than 180 mg/dL (1-2 hours) ?     Critically ill patients:  140 - 180 mg/dL  ? ?Lab Results  ?Component Value Date  ? GLUCAP 146 (H) 03/09/2022  ? HGBA1C 7.3 (H) 03/06/2022  ? ? ?Review of Glycemic Control ? Latest Reference Range & Units 03/08/22 16:15 03/08/22 21:28 03/09/22 06:15 03/09/22 11:11  ?Glucose-Capillary 70 - 99 mg/dL 122 (H) 153 (H) 103 (H) 146 (H)  ? ?Diabetes history: DM 2 ?Outpatient Diabetes medications:  ?Metformin 1000 mg bid ?Farxiga 10 mg daily ?Current orders for Inpatient glycemic control:  ?Novolog 0-15 units tid with meals and HS ? ?Inpatient Diabetes Program Recommendations:   ? ?Spoke to patient.  He states that he has not been on insulin in 10 years.  We discussed current A1C and medications.  Per Dr. Broadus John, Wilder Glade stopped for now until renal function can be re-tested in CHF clinic.  May be a candidate for GLP-1 as well such as Ozempic?? ? ?Thanks,  ?Adah Perl, RN, BC-ADM ?Inpatient Diabetes Coordinator ?Pager [phone]    (8a-5p) ? ? ? ?

## 2022-03-09 NOTE — Progress Notes (Signed)
?  Transition of Care (TOC) Screening Note ? ? ?Patient Details  ?Name: Jeffery Villa ?Date of Birth: [birth year] ? ? ?Transition of Care (TOC) CM/SW Contact:    ?Riel Hirschman, LCSW ?Phone Number: ?03/09/2022, 10:06 AM ? ? ? ?Transition of Care Department [facility]) has reviewed patient and no TOC needs have been identified at this time. We will continue to monitor patient advancement through interdisciplinary progression rounds. If new patient transition needs arise, please place a TOC consult. ?  ?

## 2022-03-10 LAB — CULTURE, BLOOD (ROUTINE X 2)
Culture: NO GROWTH
Special Requests: ADEQUATE

## 2022-03-23 NOTE — Progress Notes (Incomplete)
ADVANCED HF CLINIC CONSULT NOTE   Primary Care: HF Cardiologist: Dr. Aundra Dubin  HPI: Jeffery Villa is a 36 y.o. male with past medical history of hypertension, hyperlipidemia, insulin-dependent diabetes mellitus, CKD stage III, OSA and chronic systolic heart failure.    He was seen 6/22 by PCP, referred to cardiology for echo. Echo 9/22 showed EF 20 to 25%, severe hypokinesis of the left ventricle, moderate to severe MR, moderate to severe TR, mild to moderately dilated right atrium, moderately dilated left atrium, restrictive filling pattern noted in the left ventricle, trivial pericardial effusion, RVSP 60 mmHg.  He was started on lasix, spiro, carvedilol, Farxiga, lisinopril, amlodipine and Imdur. Zio placed 10/22 showing minimal heart rate 78, maximal heart rate 120, average heart rate 92, patient remained tachycardic 8.5% of total time, 3 episodes of wide-complex tachycardia with longest run 20 beats. 13 patient triggered episodes all showed sinus rhythm with few PVCs and PACs noted, 1 episode of 10 beat run of wide-complex tachycardia.    He say his cardiologist, Dr. Marina Goodell of [facility] cardiology, 10/22.  It was recommended he undergo outpatient Myoview, start on LifeVest, then repeat echocardiogram in 3 months.  Unfortunately, insurance did not cover [facility] and he did not follow back up. He wore the LifeVest for about a week before he discontinued it.    Admitted 5/23 with left lower extremity pain. WBC 30.7, felt he was also slightly volume overloaded. Diuresed with IV lasix and placed on abx. LE dopplers negative for DVT.  Echo this admission with EF 20-25%, global hypokinesis with moderate LV dilation, mod-severe MR (severe by PISA ERO), moderate TR.  He underwent  RHC showing mildly elevated PCWP and normal RA pressure with mild pulmonary venous hypertension, CI 3.33 (off milrinone x 1 hour). No LHC with elevated SCr. Cardiac MRI showed >50% wall thickness subendocardial LGE in the  mid anteroseptal wall, looks like a coronary disease pattern suggestive of prior MI. LVEF 15%. GDMT limited by AKI. Plan for ischemic eval in future when creatinine comes down. Discharged home, weight 264 lbs.  Today she returns for HF follow up. Overall feeling fine. Denies increasing SOB, CP, dizziness, edema, or PND/Orthopnea. Appetite ok. No fever or chills. Weight at home 170 pounds. Taking all medications.   ECG (personally reviewed):  Labs (5/23): K 4.3, creatinine 2.43     Review of Systems: [y] = yes, '[ ]'  = no   General: Weight gain '[ ]' ; Weight loss '[ ]' ; Anorexia '[ ]' ; Fatigue '[ ]' ; Fever '[ ]' ; Chills '[ ]' ; Weakness '[ ]'   Cardiac: Chest pain/pressure '[ ]' ; Resting SOB '[ ]' ; Exertional SOB '[ ]' ; Orthopnea '[ ]' ; Pedal Edema '[ ]' ; Palpitations '[ ]' ; Syncope '[ ]' ; Presyncope '[ ]' ; Paroxysmal nocturnal dyspnea'[ ]'   Pulmonary: Cough '[ ]' ; Wheezing'[ ]' ; Hemoptysis'[ ]' ; Sputum '[ ]' ; Snoring Blue.Reese ]  GI: Vomiting'[ ]' ; Dysphagia'[ ]' ; Melena'[ ]' ; Hematochezia '[ ]' ; Heartburn'[ ]' ; Abdominal pain '[ ]' ; Constipation '[ ]' ; Diarrhea '[ ]' ; BRBPR '[ ]'   GU: Hematuria'[ ]' ; Dysuria '[ ]' ; Nocturia'[ ]'   Vascular: Pain in legs with walking '[ ]' ; Pain in feet with lying flat '[ ]' ; Non-healing sores '[ ]' ; Stroke '[ ]' ; TIA '[ ]' ; Slurred speech '[ ]' ;  Neuro: Headaches'[ ]' ; Vertigo'[ ]' ; Seizures'[ ]' ; Paresthesias'[ ]' ;Blurred vision '[ ]' ; Diplopia '[ ]' ; Vision changes '[ ]'   Ortho/Skin: Arthritis '[ ]' ; Joint pain '[ ]' ; Muscle pain '[ ]' ; Joint swelling '[ ]' ; Back Pain '[ ]' ; Rash '[ ]'   Psych: Depression'[ ]' ; Anxiety'[ ]'   Heme: Bleeding problems '[ ]' ; Clotting disorders '[ ]' ; Anemia '[ ]'   Endocrine: Diabetes Blue.Reese ]; Thyroid dysfunction'[ ]'   Past Medical History:  Diagnosis Date   CHF (congestive heart failure) (HCC)    Diabetes mellitus without complication ([place])    diagnosed at age 43 due to obesity, started on insulin at that time    High cholesterol    Hypertension    Current Outpatient Medications  Medication Sig Dispense Refill   acetaminophen (TYLENOL) 325 MG  tablet Take 2 tablets (650 mg total) by mouth every 6 (six) hours as needed for mild pain (or Fever >/= 101).     aspirin 81 MG EC tablet Take 1 tablet (81 mg total) by mouth daily. 30 tablet 0   atorvastatin (LIPITOR) 40 MG tablet Take 1 tablet (40 mg total) by mouth daily. 30 tablet 0   blood glucose meter kit and supplies Dispense based on patient and insurance preference. Use up to four times daily as directed. (FOR ICD-10 E10.9, E11.9). 1 each 0   carvedilol (COREG) 25 MG tablet Take 0.5 tablets (12.5 mg total) by mouth 2 (two) times daily.     glipiZIDE (GLUCOTROL) 5 MG tablet Take 1 tablet (5 mg total) by mouth daily before breakfast. 30 tablet 0   hydrALAZINE (APRESOLINE) 25 MG tablet Take 1 tablet (25 mg total) by mouth 3 (three) times daily. 90 tablet 0   Insulin Pen Needle 30G X 5 MM MISC New needle with every insulin adminsration 100 each 5   isosorbide mononitrate (IMDUR) 30 MG 24 hr tablet Take 1 tablet (30 mg total) by mouth daily. 30 tablet 0   potassium chloride (KLOR-CON M) 10 MEQ tablet Take 1 tablet (10 mEq total) by mouth daily. 30 tablet 0   torsemide (DEMADEX) 20 MG tablet Take 2 tablets (40 mg total) by mouth daily. 60 tablet 0   No current facility-administered medications for this visit.   No Known Allergies  Social History   Socioeconomic History   Marital status: Single    Spouse name: Not on file   Number of children: Not on file   Years of education: Not on file   Highest education level: Not on file  Occupational History   Not on file  Tobacco Use   Smoking status: Never   Smokeless tobacco: Not on file  Vaping Use   Vaping Use: Never used  Substance and Sexual Activity   Alcohol use: No    Alcohol/week: 0.0 standard drinks   Drug use: No   Sexual activity: Not on file  Other Topics Concern   Not on file  Social History Narrative   Not on file   Social Determinants of Health   Financial Resource Strain: Not on file  Food Insecurity: Not on  file  Transportation Needs: Not on file  Physical Activity: Not on file  Stress: Not on file  Social Connections: Not on file  Intimate Partner Violence: Not on file   Family History  Problem Relation Age of Onset   Diabetes Mother    Hypertension Mother    Stroke Mother    Alcohol abuse Father    Cancer Maternal Grandmother    Diabetes Maternal Grandmother    Hypertension Maternal Grandmother    His mother also had heart failure in her 54s and passed away from the heart failure.    There were no vitals filed for this visit.  PHYSICAL EXAM: General:  NAD.  No resp difficulty HEENT: Normal Neck: Supple. No JVD. Carotids 2+ bilat; no bruits. No lymphadenopathy or thryomegaly appreciated. Cor: PMI nondisplaced. Regular rate & rhythm. No rubs, gallops or murmurs. Lungs: Clear Abdomen: Soft, nontender, nondistended. No hepatosplenomegaly. No bruits or masses. Good bowel sounds. Extremities: No cyanosis, clubbing, rash, edema Neuro: Alert & oriented x 3, cranial nerves grossly intact. Moves all 4 extremities w/o difficulty. Affect pleasant.  ASSESSMENT & PLAN:  1. Chronic systolic CHF: Cardiomyopathy of uncertain etiology.  Echo in 9/22 with EF 20-25%, moderate-severe MR, moderate-severe TR.  Echo this admission with EF 20-25%, global hypokinesis with moderate LV dilation, mod-severe MR (severe by PISA ERO), moderate TR.  He has had HTN since his teens, could be due to long-standing poorly controlled HTN. With DM/HTN/hyperlipidemia x years, CAD must be a consideration though no chest pain. Prior viral myocarditis is possible as well. RHC this admit showed mildly elevated PCWP and normal RA pressure with mild pulmonary venous hypertension, CI 3.33 (off milrinone for about an hour).  cMRI limited study, difficult images due to problems with breath-holding. >50% wall thickness subendocardial LGE in the mid anteroseptal wall, looks like a coronary disease pattern suggestive of prior MI. LVEF  15%. Narrow QRS, not CRT candidate. NYHA II, he is not volume overloaded on exam. - Continue torsemide 40 mg daily  - Continue carvedilol 12.5 mg bid. - Continue Bidil 0.5 tab tid and titrate up as able.  - Holding dapagliflozin with AKI, hopefully restart.  - Hold ACEI, eventually would like him on Entresto if creatinine comes down.  - Hold spironolactone while creatinine up.  - Ideally, would have coronary angiography but need improved creatinine.  At some point, will need ischemic evaluation.  2. CKD stage 3: Suspect baseline diabetic nephropathy. BMET today. 3. Type 2 diabetes: Early onset, on metformin at home.  4. HTN: Early onset in teens.  5. Hyperlipidemia: Continue atorvastatin.  6. Diabetic foot s/p toe amputation.  7. Mitral regurgitation: Possibly severe on echo. Appears at least mod on cMRI but regurgitant fraction 13% suggests mild MR. Suspect calculation is inaccurate given poor images generally. Most likely functional.   - Will need eventual evaluation by TEE and consideration for Mitraclip if this does not improve with medical management.   Follow up in 3 weeks with PharmD, 6 weeks with APP and 12 weeks with Dr. Aundra Dubin + echo.  Allena Katz, FNP-BC 03/23/22

## 2022-03-25 ENCOUNTER — Encounter (HOSPITAL_COMMUNITY): Payer: Self-pay

## 2022-03-25 ENCOUNTER — Ambulatory Visit (HOSPITAL_COMMUNITY)
Admission: RE | Admit: 2022-03-25 | Discharge: 2022-03-25 | Disposition: A | Discharge status: Home / Self Care | Payer: 59 | Source: Ambulatory Visit | Attending: Family Medicine | Admitting: Family Medicine

## 2022-03-25 VITALS — BP 112/78 | HR 91 | Wt 250.6 lb

## 2022-03-25 DIAGNOSIS — Z833 Family history of diabetes mellitus: Secondary | ICD-10-CM | POA: Diagnosis not present

## 2022-03-25 DIAGNOSIS — Z794 Long term (current) use of insulin: Secondary | ICD-10-CM | POA: Insufficient documentation

## 2022-03-25 DIAGNOSIS — Z79899 Other long term (current) drug therapy: Secondary | ICD-10-CM | POA: Diagnosis not present

## 2022-03-25 DIAGNOSIS — M7989 Other specified soft tissue disorders: Secondary | ICD-10-CM | POA: Insufficient documentation

## 2022-03-25 DIAGNOSIS — G4733 Obstructive sleep apnea (adult) (pediatric): Secondary | ICD-10-CM | POA: Insufficient documentation

## 2022-03-25 DIAGNOSIS — I5022 Chronic systolic (congestive) heart failure: Secondary | ICD-10-CM | POA: Diagnosis present

## 2022-03-25 DIAGNOSIS — I340 Nonrheumatic mitral (valve) insufficiency: Secondary | ICD-10-CM | POA: Insufficient documentation

## 2022-03-25 DIAGNOSIS — I130 Hypertensive heart and chronic kidney disease with heart failure and stage 1 through stage 4 chronic kidney disease, or unspecified chronic kidney disease: Secondary | ICD-10-CM | POA: Diagnosis not present

## 2022-03-25 DIAGNOSIS — F419 Anxiety disorder, unspecified: Secondary | ICD-10-CM | POA: Insufficient documentation

## 2022-03-25 DIAGNOSIS — I429 Cardiomyopathy, unspecified: Secondary | ICD-10-CM | POA: Insufficient documentation

## 2022-03-25 DIAGNOSIS — Z8249 Family history of ischemic heart disease and other diseases of the circulatory system: Secondary | ICD-10-CM | POA: Diagnosis not present

## 2022-03-25 DIAGNOSIS — E1122 Type 2 diabetes mellitus with diabetic chronic kidney disease: Secondary | ICD-10-CM | POA: Insufficient documentation

## 2022-03-25 DIAGNOSIS — E785 Hyperlipidemia, unspecified: Secondary | ICD-10-CM | POA: Insufficient documentation

## 2022-03-25 DIAGNOSIS — Z9989 Dependence on other enabling machines and devices: Secondary | ICD-10-CM

## 2022-03-25 DIAGNOSIS — S81802A Unspecified open wound, left lower leg, initial encounter: Secondary | ICD-10-CM

## 2022-03-25 DIAGNOSIS — N183 Chronic kidney disease, stage 3 unspecified: Secondary | ICD-10-CM | POA: Diagnosis not present

## 2022-03-25 DIAGNOSIS — I10 Essential (primary) hypertension: Secondary | ICD-10-CM

## 2022-03-25 LAB — BRAIN NATRIURETIC PEPTIDE: B Natriuretic Peptide: 2646.9 pg/mL — ABNORMAL HIGH (ref 0.0–100.0)

## 2022-03-25 LAB — BASIC METABOLIC PANEL
Anion gap: 6 (ref 5–15)
BUN: 77 mg/dL — ABNORMAL HIGH (ref 6–20)
CO2: 25 mmol/L (ref 22–32)
Calcium: 8.6 mg/dL — ABNORMAL LOW (ref 8.9–10.3)
Chloride: 106 mmol/L (ref 98–111)
Creatinine, Ser: 2.57 mg/dL — ABNORMAL HIGH (ref 0.61–1.24)
GFR, Estimated: 33 mL/min — ABNORMAL LOW (ref >60)
Glucose, Bld: 90 mg/dL (ref 70–99)
Potassium: 5.3 mmol/L — ABNORMAL HIGH (ref 3.5–5.1)
Sodium: 137 mmol/L (ref 135–145)

## 2022-03-25 LAB — CBC
HCT: 32.9 % — ABNORMAL LOW (ref 39.0–52.0)
Hemoglobin: 10.2 g/dL — ABNORMAL LOW (ref 13.0–17.0)
MCH: 22.1 pg — ABNORMAL LOW (ref 26.0–34.0)
MCHC: 31 g/dL (ref 30.0–36.0)
MCV: 71.2 fL — ABNORMAL LOW (ref 80.0–100.0)
Platelets: 510 10*3/uL — ABNORMAL HIGH (ref 150–400)
RBC: 4.62 MIL/uL (ref 4.22–5.81)
RDW: 20.7 % — ABNORMAL HIGH (ref 11.5–15.5)
WBC: 8.1 10*3/uL (ref 4.0–10.5)
nRBC: 0 % (ref 0.0–0.2)

## 2022-03-25 NOTE — Patient Instructions (Signed)
It was great to see you today! No medication changes are needed at this time.  Labs today We will only contact you if something comes back abnormal or we need to make some changes. Otherwise no news is good news!  You have been referred to [facility] -they will be in contact with an appoinment  You have been referred to [facility] -they will be in contact with an appointment  Your physician recommends that you schedule a follow-up appointment in: 3-4 weeks with the pharmacy team, in 6 weeks  in the Advanced Practitioners (PA/NP) Clinic and in 12 weeks with Dr Aundra Dubin and echo  Your physician has requested that you have an echocardiogram. Echocardiography is a painless test that uses sound waves to create images of your heart. It provides your doctor with information about the size and shape of your heart and how well your heart's chambers and valves are working. This procedure takes approximately one hour. There are no restrictions for this procedure.  Do the following things EVERYDAY: Weigh yourself in the morning before breakfast. Write it down and keep it in a log. Take your medicines as prescribed Eat low salt foods--Limit salt (sodium) to 2000 mg per day.  Stay as active as you can everyday Limit all fluids for the day to less than 2 liters  At the [facility], you and your health needs are our priority. As part of our continuing mission to provide you with exceptional heart care, we have created designated Provider Care Teams. These Care Teams include your primary Cardiologist (physician) and Advanced Practice Providers (APPs- Physician Assistants and Nurse Practitioners) who all work together to provide you with the care you need, when you need it.   You may see any of the following providers on your designated Care Team at your next follow up: Dr Glori Bickers Dr Haynes Kerns, NP Lyda Jester, [place] [facility] [place], [place] Audry Riles, PharmD   Please be sure to bring in all your medications bottles to every appointment.   If you have any questions or concerns before your next appointment please send [place] a message through [place] or call our office at [phone].    TO LEAVE A MESSAGE FOR THE NURSE SELECT OPTION 2, PLEASE LEAVE A MESSAGE INCLUDING: YOUR NAME DATE OF BIRTH CALL BACK NUMBER REASON FOR CALL**this is important as we prioritize the call backs  YOU WILL RECEIVE A CALL BACK THE SAME DAY AS LONG AS YOU CALL BEFORE 4:00 PM

## 2022-03-29 ENCOUNTER — Encounter (HOSPITAL_COMMUNITY): Payer: Self-pay | Admitting: *Deleted

## 2022-04-05 NOTE — Progress Notes (Incomplete)
***In Progress***    Advanced Heart Failure Clinic Note   PCP:  HF Cardiologist: Dr. Aundra Dubin  HPI:  Mr. Gupte is a 36 y.o. male with past medical history of hypertension, hyperlipidemia, insulin-dependent diabetes mellitus, CKD stage III, OSA and chronic systolic heart failure.     He was seen 03/2021 by PCP, referred to cardiology for echo. Echo 06/2021 showed EF 20 to 25%, severe hypokinesis of the left ventricle, moderate to severe MR, moderate to severe TR, mild to moderately dilated right atrium, moderately dilated left atrium, restrictive filling pattern noted in the left ventricle, trivial pericardial effusion, RVSP 60 mmHg.  He was started on Lasix, spironolactone, carvedilol, Farxiga, lisinopril, amlodipine and Imdur. Zio placed 07/2021 showing minimal heart rate 78, maximal heart rate 120, average heart rate 92, patient remained tachycardic 8.5% of total time, 3 episodes of wide-complex tachycardia with longest run 20 beats. 13 patient triggered episodes all showed sinus rhythm with few PVCs and PACs noted, 1 episode of 10 beat run of wide-complex tachycardia.     He saw his cardiologist, Dr. Marina Goodell of [facility] cardiology, 07/2021.  It was recommended he undergo outpatient Myoview, start LifeVest, then repeat echocardiogram in 3 months.  Unfortunately, insurance did not cover [facility] and he did not follow back up. He wore the LifeVest for about a week before he discontinued it.     Admitted 02/2022 with left lower extremity pain. WBC 30.7, felt he was also slightly volume overloaded. Diuresed with IV Lasix and placed on abx. LE dopplers negative for DVT.  Echo showed EF 20-25%, global hypokinesis with moderate LV dilation, mod-severe MR (severe by PISA ERO), moderate TR.  He underwent  RHC showing mildly elevated PCWP and normal RA pressure with mild pulmonary venous hypertension, CI 3.33 (off milrinone x 1 hour). No LHC with elevated SCr. Cardiac MRI showed >50% wall thickness  subendocardial LGE in the mid anteroseptal wall, looks like a coronary disease pattern suggestive of prior MI. LVEF 15%. GDMT limited by AKI. Plan for ischemic eval in future when creatinine comes down. Discharged home, weight 264 lbs.   Presented to AHF Clinic for HF follow up 03/25/22. Overall was feeling fine. He had occasional  episodes of dyspnea at rest that resolvd when he eats a meal. He attributed this to anxiety over recent health issues. He noted that he went to the gym recently and lifted light weights and was able to walk without breathing difficulty. Continued to have swelling and weeping in left leg. Denied CP, dizziness, or PND/Orthopnea. Appetite was ok. No fever or chills. Weight at home ws 262 pounds. Reported taking all medications. Did not tolerate CPAP. He has 2 children (29 yr old daughter & 37 year old son).  Today he returns to HF clinic for pharmacist medication titration. At last visit with APP, no medication changes were made in clinic. Potassium supplements were discontinued when potassium level was elevated at 5.3 on BMET. He was referred to Nephrology.  Shortness of breath/dyspnea on exertion? {YES NO:22349}  Orthopnea/PND? {YES NO:22349} Edema? {YES NO:22349} Lightheadedness/dizziness? {YES NO:22349} Daily weights at home? {YES NO:22349} Blood pressure/heart rate monitoring at home? {YES [id number] Following low-sodium/fluid-restricted diet? {YES NO:22349}  HF Medications: Carvedilol 12.5 mg BID Hydralazine 25 mg TID Imdur 30 mg daily Torsemide 20 mg daily   Has the patient been experiencing any side effects to the medications prescribed?  {YES NO:22349}  Does the patient have any problems obtaining medications due to transportation or finances?   {YES  [id number]  Understanding of regimen: {excellent/good/fair/poor:19665} Understanding of indications: {excellent/good/fair/poor:19665} Potential of compliance: {excellent/good/fair/poor:19665} Patient understands to  avoid NSAIDs. Patient understands to avoid decongestants.    Pertinent Lab Values: Serum creatinine ***, BUN ***, Potassium ***, Sodium ***, BNP ***, Magnesium ***, Digoxin ***   Vital Signs: Weight: *** (last clinic weight: ***) Blood pressure: ***  Heart rate: ***   Assessment/Plan: 1. Chronic systolic CHF: Cardiomyopathy of uncertain etiology.  Echo in 06/2021 with EF 20-25%, moderate-severe MR, moderate-severe TR.  Echo this admission with EF 20-25%, global hypokinesis with moderate LV dilation, mod-severe MR (severe by PISA ERO), moderate TR.  He has had HTN since his teens, could be due to long-standing poorly controlled HTN. With DM/HTN/hyperlipidemia x years, CAD must be a consideration though no chest pain. Prior viral myocarditis is possible as well. RHC this admit showed mildly elevated PCWP and normal RA pressure with mild pulmonary venous hypertension, CI 3.33 (off milrinone for about an hour).  cMRI limited study, difficult images due to problems with breath-holding. >50% wall thickness subendocardial LGE in the mid anteroseptal wall, looks like a coronary disease pattern suggestive of prior MI. LVEF 15%. Narrow QRS, not CRT candidate.  - NYHA II, he is not volume overloaded on exam. - Continue torsemide 40 mg daily.  - Continue carvedilol 12.5 mg BID. - Continue hydralazine 25 mg TID + Imdur 30 mg daily. - Holding dapagliflozin, spiro with recent AKI.  - Eventual Entresto if creatinine comes down.  - Ideally, would have coronary angiography but need improved creatinine.  At some point, will need ischemic evaluation.  LHC if creatinine decreases to below 2 versus Cardiolite if it stays above 2.  2. CKD stage 3: Suspect baseline diabetic nephropathy.  - Previously referred to [facility].  3. Type 2 diabetes: Early onset, on metformin at home. Last A1c 7.3 - Needs PCP, given info at previous visit to establish care. 4. HTN: Early onset in teens. Controlled on  current meds. 5. Hyperlipidemia: Continue atorvastatin.  6. Diabetic foot s/p toe amputation.  7. Mitral regurgitation: Possibly severe on echo. Appears at least mod on cMRI but regurgitant fraction 13% suggests mild MR. Suspect calculation is inaccurate given poor images generally. Most likely functional.   - Will need eventual evaluation by TEE and consideration for Mitraclip if this does not improve with medical management.  8. LLE wound: Recent cellulitis and treated with IV ceftriaxone and vanc. Refer to [facility], will be slow to heal with DM2. - Elevate leg and continue diuretics. - CBC today. 9. OSA: AHI 35. Previously followed by Neurology at [facility], but they are out of network with his insurance. Having trouble tolerating CPAP. - Refer to sleep medicine next visit.   Follow up ***   Audry Riles, PharmD, BCPS, BCCP, CPP Heart Failure Clinic Pharmacist [phone]

## 2022-04-06 ENCOUNTER — Encounter (HOSPITAL_BASED_OUTPATIENT_CLINIC_OR_DEPARTMENT_OTHER): Payer: 59 | Attending: General Surgery | Admitting: General Surgery

## 2022-04-06 DIAGNOSIS — E1142 Type 2 diabetes mellitus with diabetic polyneuropathy: Secondary | ICD-10-CM | POA: Insufficient documentation

## 2022-04-06 DIAGNOSIS — I5022 Chronic systolic (congestive) heart failure: Secondary | ICD-10-CM | POA: Diagnosis not present

## 2022-04-06 DIAGNOSIS — L97221 Non-pressure chronic ulcer of left calf limited to breakdown of skin: Secondary | ICD-10-CM | POA: Insufficient documentation

## 2022-04-06 DIAGNOSIS — I130 Hypertensive heart and chronic kidney disease with heart failure and stage 1 through stage 4 chronic kidney disease, or unspecified chronic kidney disease: Secondary | ICD-10-CM | POA: Insufficient documentation

## 2022-04-06 DIAGNOSIS — L0390 Cellulitis, unspecified: Secondary | ICD-10-CM | POA: Insufficient documentation

## 2022-04-06 DIAGNOSIS — N183 Chronic kidney disease, stage 3 unspecified: Secondary | ICD-10-CM | POA: Diagnosis not present

## 2022-04-06 DIAGNOSIS — E1122 Type 2 diabetes mellitus with diabetic chronic kidney disease: Secondary | ICD-10-CM | POA: Diagnosis not present

## 2022-04-06 DIAGNOSIS — E1159 Type 2 diabetes mellitus with other circulatory complications: Secondary | ICD-10-CM | POA: Diagnosis not present

## 2022-04-06 DIAGNOSIS — E1165 Type 2 diabetes mellitus with hyperglycemia: Secondary | ICD-10-CM | POA: Insufficient documentation

## 2022-04-06 DIAGNOSIS — E11622 Type 2 diabetes mellitus with other skin ulcer: Secondary | ICD-10-CM | POA: Diagnosis present

## 2022-04-07 ENCOUNTER — Other Ambulatory Visit (HOSPITAL_COMMUNITY): Payer: Self-pay

## 2022-04-07 ENCOUNTER — Encounter (HOSPITAL_COMMUNITY): Payer: Self-pay | Admitting: Cardiology

## 2022-04-07 ENCOUNTER — Other Ambulatory Visit (HOSPITAL_COMMUNITY): Payer: Self-pay | Admitting: *Deleted

## 2022-04-07 MED ORDER — TORSEMIDE 20 MG PO TABS
40.0000 mg | ORAL_TABLET | Freq: Every day | ORAL | 0 refills | Status: DC
  Filled 2022-04-07: qty 60, 30d supply, fill #0

## 2022-04-07 NOTE — Progress Notes (Signed)
Jeffery Villa, Jeffery Villa ([id number]) Visit Report for 04/06/2022 Abuse Risk Screen Details Patient Name: Date of Service: Jeffery Villa, Jeffery Villa 04/06/2022 9:00 Jeffery Villa Record Number: [id number] Patient Account Number: [id number] Date of Birth/Sex: Treating RN: 1986-09-01 (36 y.o. Jeffery Villa Primary Care Layli Capshaw: PA Haig Prophet, NO Other Clinician: Referring Tzivia Oneil: Treating Chryl Holten/Extender: Lenell Antu in Treatment: 0 Abuse Risk Screen Items Answer ABUSE RISK SCREEN: Has anyone close to you tried to hurt or harm you recentlyo No Do you feel uncomfortable with anyone in your familyo No Has anyone forced you do things that you didnt want to doo No Electronic Signature(s) Signed: 04/07/2022 6:00:02 PM By: Levan Hurst RN, BSN Entered By: Levan Hurst on 04/06/2022 09:29:16 -------------------------------------------------------------------------------- Activities of Daily Living Details Patient Name: Date of Service: Jeffery RDStark, Villa 04/06/2022 9:00 Jeffery Villa Record Number: [id number] Patient Account Number: [id number] Date of Birth/Sex: Treating RN: 05-01-1986 (36 y.o. Jeffery Villa Primary Care Alisha Burgo: PA Haig Prophet, NO Other Clinician: Referring Verble Styron: Treating Jenene Kauffmann/Extender: Lenell Antu in Treatment: 0 Activities of Daily Living Items Answer Activities of Daily Living (Please select one for each item) Drive Automobile Completely Able T Medications ake Completely Able Use T elephone Completely Able Care for Appearance Completely Able Use T oilet Completely Able Bath / Shower Completely Able Dress Self Completely Able Feed Self Completely Able Walk Completely Able Get In / Out Bed Completely Able Housework Completely Able Prepare Meals Completely [place] for Self Completely Able Electronic Signature(s) Signed: 04/07/2022 6:00:02 PM By: Levan Hurst RN,  BSN Entered By: Levan Hurst on 04/06/2022 09:29:33 -------------------------------------------------------------------------------- Education Screening Details Patient Name: Date of Service: Jeffery Knudsen. 04/06/2022 9:00 Jeffery Villa Record Number: [id number] Patient Account Number: [id number] Date of Birth/Sex: Treating RN: Jan 27, 1986 (36 y.o. Jeffery Villa Primary Care Allisha Harter: PA Haig Prophet, NO Other Clinician: Referring Nataleah Scioneaux: Treating Cambri Plourde/Extender: Lenell Antu in Treatment: 0 Primary Learner Assessed: Patient Learning Preferences/Education Level/Primary Language Learning Preference: Explanation, Demonstration, Printed Material Highest Education Level: College or Above Preferred Language: English Cognitive Barrier Language Barrier: No Translator Needed: No Memory Deficit: No Emotional Barrier: No Cultural/Religious Beliefs Affecting Medical Care: No Physical Barrier Impaired Vision: No Impaired Hearing: No Decreased Hand dexterity: No Knowledge/Comprehension Knowledge Level: High Comprehension Level: High Ability to understand written instructions: High Ability to understand verbal instructions: High Motivation Anxiety Level: Calm Cooperation: Cooperative Education Importance: Acknowledges Need Interest in Health Problems: Asks Questions Perception: Coherent Willingness to Engage in Self-Management High Activities: Readiness to Engage in Self-Management High Activities: Electronic Signature(s) Signed: 04/07/2022 6:00:02 PM By: Levan Hurst RN, BSN Entered By: Levan Hurst on 04/06/2022 09:29:51 -------------------------------------------------------------------------------- Fall Risk Assessment Details Patient Name: Date of Service: Jeffery Villa, Jeffery Dibbles. 04/06/2022 9:00 Jeffery Villa Record Number: [id number] Patient Account Number: [id number] Date of Birth/Sex: Treating RN: November 11, 1985 (36 y.o. Jeffery Villa Primary Care Koda Defrank: PA Haig Prophet, NO Other Clinician: Referring Donzel Romack: Treating Ruey Storer/Extender: Lenell Antu in Treatment: 0 Fall Risk Assessment Items Have you had 2 or more falls in the last 12 monthso 0 No Have you had any fall that resulted in injury in the last 12 monthso 0 No FALLS RISK SCREEN History of falling - immediate or within 3 months 0 No Secondary diagnosis (Do you have 2 or more medical diagnoseso) 0 No Ambulatory aid None/bed rest/wheelchair/nurse 0 Yes Crutches/cane/walker 0 No Furniture 0 No Intravenous therapy Access/Saline/Heparin Lock 0 No Gait/Transferring Normal/  bed rest/ wheelchair 0 Yes Weak (short steps with or without shuffle, stooped but able to lift head while walking, may seek 0 No support from furniture) Impaired (short steps with shuffle, may have difficulty arising from chair, head down, impaired 0 No balance) Mental Status Oriented to own ability 0 Yes Electronic Signature(s) Signed: 04/07/2022 6:00:02 PM By: Levan Hurst RN, BSN Entered By: Levan Hurst on 04/06/2022 09:30:03 -------------------------------------------------------------------------------- Foot Assessment Details Patient Name: Date of Service: Jeffery Knudsen. 04/06/2022 9:00 Jeffery Villa Medical Record Number: [id number] Patient Account Number: [id number] Date of Birth/Sex: Treating RN: [birth year] (36 y.o. Jeffery Villa Primary Care Ameir Faria: PA TIENT, NO Other Clinician: Referring Lanell Dubie: Treating Dosia Yodice/Extender: Lenell Antu in Treatment: 0 Foot Assessment Items Site Locations + = Sensation present, - = Sensation absent, C = Callus, U = Ulcer R = Redness, W = Warmth, Villa = Maceration, PU = Pre-ulcerative lesion F = Fissure, S = Swelling, D = Dryness Assessment Right: Left: Other Deformity: No No Prior Foot Ulcer: No No Prior Amputation: No No Charcot Joint: No No Ambulatory Status:  Ambulatory Without Help Gait: Steady Electronic Signature(s) Signed: 04/07/2022 6:00:02 PM By: Levan Hurst RN, BSN Entered By: Levan Hurst on 04/06/2022 09:37:31 -------------------------------------------------------------------------------- Nutrition Risk Screening Details Patient Name: Date of Service: Jeffery Knudsen. 04/06/2022 9:00 Jeffery Villa Medical Record Number: [id number] Patient Account Number: [id number] Date of Birth/Sex: Treating RN: March 28, 1986 (36 y.o. Jeffery Villa Primary Care Arma Reining: PA Haig Prophet, NO Other Clinician: Referring Fread Kottke: Treating Korry Dalgleish/Extender: Lenell Antu in Treatment: 0 Height (in): 74 Weight (lbs): 250 Body Mass Index (BMI): 32.1 Nutrition Risk Screening Items Score Screening NUTRITION RISK SCREEN: I have an illness or condition that made me change the kind and/or amount of food I eat 2 Yes I eat fewer than two meals per day 0 No I eat few fruits and vegetables, or milk products 0 No I have three or more drinks of beer, liquor or wine almost every day 0 No I have tooth or mouth problems that make it hard for me to eat 0 No I don't always have enough money to buy the food I need 0 No I eat alone most of the time 0 No I take three or more different prescribed or over-the-counter drugs Jeffery day 1 Yes Without wanting to, I have lost or gained 10 pounds in the last six months 0 No I am not always physically able to shop, cook and/or feed myself 0 No Nutrition Protocols Good Risk Protocol Moderate Risk Protocol 0 Provide education on nutrition High Risk Proctocol Risk Level: Moderate Risk Score: 3 Electronic Signature(s) Signed: 04/07/2022 6:00:02 PM By: Levan Hurst RN, BSN Entered By: Levan Hurst on 04/06/2022 09:30:18

## 2022-04-07 NOTE — Progress Notes (Signed)
Jeffery Villa, Jeffery Villa ([id number]) Visit Report for 04/06/2022 Allergy List Details Patient Name: Date of Service: Jeffery Villa 04/06/2022 9:00 A M Medical Record Number: [id number] Patient Account Number: [id number] Date of Birth/Sex: Treating RN: [birth year] (36 y.o. Elizebeth Koller Primary Care Shunda Rabadi: PA Fidela Juneau Other Clinician: Referring Abigael Mogle: Treating Dontavius Keim/Extender: Elveria Rising in Treatment: 0 Allergies Active Allergies No Known Drug Allergies Allergy Notes Electronic Signature(s) Signed: 04/07/2022 6:00:02 PM By: Zandra Abts RN, BSN Entered By: Zandra Abts on 04/06/2022 09:21:11 -------------------------------------------------------------------------------- Arrival Information Details Patient Name: Date of Service: Jeffery Arch. 04/06/2022 9:00 A M Medical Record Number: [id number] Patient Account Number: [id number] Date of Birth/Sex: Treating RN: [birth year] (36 y.o. Elizebeth Koller Primary Care Wyllow Seigler: PA Zenovia Jordan, NO Other Clinician: Referring Dashawna Delbridge: Treating Brock Mokry/Extender: Elveria Rising in Treatment: 0 Visit Information Patient Arrived: Ambulatory Arrival Time: 09:25 Accompanied By: alone Transfer Assistance: None Patient Identification Verified: Yes Secondary Verification Process Completed: Yes Patient Requires Transmission-Based Precautions: No Patient Has Alerts: Yes Patient Alerts: L ABI non compressible Electronic Signature(s) Signed: 04/07/2022 6:00:02 PM By: Zandra Abts RN, BSN Entered By: Zandra Abts on 04/06/2022 09:55:55 -------------------------------------------------------------------------------- Clinic Level of Care Assessment Details Patient Name: Date of Service: [name] 04/06/2022 9:00 A M Medical Record Number: [id number] Patient Account Number: [id number] Date of Birth/Sex: Treating RN: 12-Sep-1986 (36 y.o. Elizebeth Koller Primary  Care Michael Walrath: PA Zenovia Jordan, NO Other Clinician: Referring Raychelle Hudman: Treating Teleshia Lemere/Extender: Elveria Rising in Treatment: 0 Clinic Level of Care Assessment Items TOOL 1 Quantity Score X- 1 0 Use when EandM and Procedure is performed on INITIAL visit ASSESSMENTS - Nursing Assessment / Reassessment X- 1 20 General Physical Exam (combine w/ comprehensive assessment (listed just below) when performed on new pt. evals) X- 1 25 Comprehensive Assessment (HX, ROS, Risk Assessments, Wounds Hx, etc.) ASSESSMENTS - Wound and Skin Assessment / Reassessment []  - 0 Dermatologic / Skin Assessment (not related to wound area) ASSESSMENTS - Ostomy and/or Continence Assessment and Care []  - 0 Incontinence Assessment and Management []  - 0 Ostomy Care Assessment and Management (repouching, etc.) PROCESS - Coordination of Care X - Simple Patient / Family Education for ongoing care 1 15 []  - 0 Complex (extensive) Patient / Family Education for ongoing care X- 1 10 Staff obtains Chiropractor, Records, T Results / Process Orders est []  - 0 Staff telephones HHA, Nursing Homes / Clarify orders / etc []  - 0 Routine Transfer to another Facility (non-emergent condition) []  - 0 Routine Hospital Admission (non-emergent condition) X- 1 15 New Admissions / Manufacturing engineer / Ordering NPWT Apligraf, etc. , []  - 0 Emergency Hospital Admission (emergent condition) PROCESS - Special Needs []  - 0 Pediatric / Minor Patient Management []  - 0 Isolation Patient Management []  - 0 Hearing / Language / Visual special needs []  - 0 Assessment of Community assistance (transportation, D/C planning, etc.) []  - 0 Additional assistance / Altered mentation []  - 0 Support Surface(s) Assessment (bed, cushion, seat, etc.) INTERVENTIONS - Miscellaneous []  - 0 External ear exam []  - 0 Patient Transfer (multiple staff / Nurse, adult / Similar devices) []  - 0 Simple Staple / Suture removal  (25 or less) []  - 0 Complex Staple / Suture removal (26 or more) []  - 0 Hypo/Hyperglycemic Management (do not check if billed separately) X- 1 15 Ankle / Brachial Index (ABI) - do not check if billed separately Has the patient been seen at the hospital within the  last three years: Yes Total Score: 100 Level Of Care: New/Established - Level 3 Electronic Signature(s) Signed: 04/07/2022 6:00:02 PM By: Zandra Abts RN, BSN Entered By: Zandra Abts on 04/06/2022 11:16:00 -------------------------------------------------------------------------------- Compression Therapy Details Patient Name: Date of Service: Jeffery Arch. 04/06/2022 9:00 A M Medical Record Number: [id number] Patient Account Number: [id number] Date of Birth/Sex: Treating RN: 1985-11-26 (36 y.o. Elizebeth Koller Primary Care Santosha Jividen: PA Fidela Juneau Other Clinician: Referring Kameah Rawl: Treating Korrey Schleicher/Extender: Elveria Rising in Treatment: 0 Compression Therapy Performed for Wound Assessment: Wound #1 Left,Lateral Lower Leg Performed By: Clinician Zandra Abts, RN Compression Type: Three Layer Post Procedure Diagnosis Same as Pre-procedure Electronic Signature(s) Signed: 04/07/2022 6:00:02 PM By: Zandra Abts RN, BSN Entered By: Zandra Abts on 04/06/2022 09:59:05 -------------------------------------------------------------------------------- Encounter Discharge Information Details Patient Name: Date of Service: Jeffery Arch. 04/06/2022 9:00 A M Medical Record Number: [id number] Patient Account Number: [id number] Date of Birth/Sex: Treating RN: 1985/12/02 (36 y.o. Elizebeth Koller Primary Care Miles Borkowski: PA Zenovia Jordan, NO Other Clinician: Referring Maritza Hosterman: Treating Anani Gu/Extender: Elveria Rising in Treatment: 0 Encounter Discharge Information Items Post Procedure Vitals Discharge Condition: Stable Temperature (F): 98.2 Ambulatory  Status: Ambulatory Pulse (bpm): 103 Discharge Destination: Home Respiratory Rate (breaths/min): 18 Transportation: Private Auto Blood Pressure (mmHg): 122/92 Accompanied By: alone Schedule Follow-up Appointment: Yes Clinical Summary of Care: Patient Declined Electronic Signature(s) Signed: 04/07/2022 6:00:02 PM By: Zandra Abts RN, BSN Entered By: Zandra Abts on 04/06/2022 11:17:22 -------------------------------------------------------------------------------- Lower Extremity Assessment Details Patient Name: Date of Service: Jeffery Arch. 04/06/2022 9:00 A M Medical Record Number: [id number] Patient Account Number: [id number] Date of Birth/Sex: Treating RN: June 18, 1986 (36 y.o. Elizebeth Koller Primary Care Hilery Wintle: PA Zenovia Jordan, NO Other Clinician: Referring Raquell Richer: Treating Antowan Samford/Extender: Elveria Rising in Treatment: 0 Edema Assessment Assessed: [Left: No] [Right: No] Edema: [Left: Ye] [Right: s] Calf Left: Right: Point of Measurement: 35 cm From Medial Instep 45 cm Ankle Left: Right: Point of Measurement: 9 cm From Medial Instep 27.5 cm Knee To Floor Left: Right: From Medial Instep 45 cm Vascular Assessment Pulses: Dorsalis Pedis Palpable: [Left:Yes] Electronic Signature(s) Signed: 04/07/2022 6:00:02 PM By: Zandra Abts RN, BSN Entered By: Zandra Abts on 04/06/2022 09:38:21 -------------------------------------------------------------------------------- Multi Wound Chart Details Patient Name: Date of Service: Jeffery Arch. 04/06/2022 9:00 A M Medical Record Number: [id number] Patient Account Number: [id number] Date of Birth/Sex: Treating RN: April 30, 1986 (36 y.o. [name] Primary Care Hutson Luft: PA Zenovia Jordan, [place] Other Clinician: Referring Eloise Mula: Treating Skippy Marhefka/Extender: Elveria Rising in Treatment: 0 Vital Signs Height(in): 74 Pulse(bpm): 103 Weight(lbs): 250 Blood  Pressure(mmHg): 122/92 Body Mass Index(BMI): 32.1 Temperature(F): 98.2 Respiratory Rate(breaths/min): 18 Photos: [N/A:N/A] Left, Lateral Lower Leg N/A N/A Wound Location: Blister N/A N/A Wounding Event: Venous Leg Ulcer N/A N/A Primary Etiology: Congestive Heart Failure, N/A N/A Comorbid History: Hypertension, Type II Diabetes, Neuropathy 03/09/2022 N/A N/A Date Acquired: 0 N/A N/A Weeks of Treatment: Open N/A N/A Wound Status: No N/A N/A Wound Recurrence: 1.2x1x0.1 N/A N/A Measurements L x W x D (cm) 0.942 N/A N/A A (cm) : rea 0.094 N/A N/A Volume (cm) : 0.00% N/A N/A % Reduction in Area: 0.00% N/A N/A % Reduction in Volume: Full Thickness Without Exposed N/A N/A Classification: Support Structures Medium N/A N/A Exudate A mount: Serosanguineous N/A N/A Exudate Type: red, brown N/A N/A Exudate Color: Flat and Intact N/A N/A Wound Margin: Small (1-33%) N/A N/A Granulation A mount: Pink N/A N/A Granulation Quality:  Large (67-100%) N/A N/A Necrotic A mount: Eschar, Adherent Slough N/A N/A Necrotic Tissue: Fat Layer (Subcutaneous Tissue): Yes N/A N/A Exposed Structures: Fascia: No Tendon: No Muscle: No Joint: No Bone: No None N/A N/A Epithelialization: Debridement - Selective/Open Wound N/A N/A Debridement: Pre-procedure Verification/Time Out 09:57 N/A N/A Taken: Necrotic/Eschar N/A N/A Tissue Debrided: Non-Viable Tissue N/A N/A Level: 1.2 N/A N/A Debridement A (sq cm): rea Curette N/A N/A Instrument: Minimum N/A N/A Bleeding: Pressure N/A N/A Hemostasis A chieved: 4 N/A N/A Procedural Pain: 2 N/A N/A Post Procedural Pain: Procedure was tolerated well N/A N/A Debridement Treatment Response: 1.2x1x0.1 N/A N/A Post Debridement Measurements L x W x D (cm) 0.094 N/A N/A Post Debridement Volume: (cm) Compression Therapy N/A N/A Procedures Performed: Debridement Treatment Notes Electronic Signature(s) Signed: 04/06/2022 10:03:09  AM By: Duanne Guess MD FACS Signed: 04/07/2022 5:43:09 PM By: Zenaida Deed RN, BSN Entered By: Duanne Guess on 04/06/2022 10:03:08 -------------------------------------------------------------------------------- Multi-Disciplinary Care Plan Details Patient Name: Date of Service: Jeffery Arch. 04/06/2022 9:00 A M Medical Record Number: [id number] Patient Account Number: [id number] Date of Birth/Sex: Treating RN: 12/16/1985 (36 y.o. Elizebeth Koller Primary Care Desarie Feild: PA Zenovia Jordan, [place] Other Clinician: Referring Lilyannah Zuelke: Treating Lania Zawistowski/Extender: Elveria Rising in Treatment: 0 Multidisciplinary Care Plan reviewed with physician Active Inactive Nutrition Nursing Diagnoses: Impaired glucose control: actual or potential Potential for alteratiion in Nutrition/Potential for imbalanced nutrition Goals: Patient/caregiver will maintain therapeutic glucose control Date Initiated: 04/06/2022 Target Resolution Date: 05/07/2022 Goal Status: Active Interventions: Assess HgA1c results as ordered upon admission and as needed Assess patient nutrition upon admission and as needed per policy Provide education on elevated blood sugars and impact on wound healing Provide education on nutrition Notes: Venous Leg Ulcer Nursing Diagnoses: Knowledge deficit related to disease process and management Potential for venous Insuffiency (use before diagnosis confirmed) Goals: Patient will maintain optimal edema control Date Initiated: 04/06/2022 Target Resolution Date: 05/07/2022 Goal Status: Active Interventions: Assess peripheral edema status every visit. Compression as ordered Provide education on venous insufficiency Notes: Wound/Skin Impairment Nursing Diagnoses: Impaired tissue integrity Knowledge deficit related to ulceration/compromised skin integrity Goals: Patient/caregiver will verbalize understanding of skin care regimen Date Initiated:  04/06/2022 Target Resolution Date: 05/07/2022 Goal Status: Active Interventions: Assess patient/caregiver ability to obtain necessary supplies Assess patient/caregiver ability to perform ulcer/skin care regimen upon admission and as needed Assess ulceration(s) every visit Provide education on ulcer and skin care Notes: Electronic Signature(s) Signed: 04/07/2022 6:00:02 PM By: Zandra Abts RN, BSN Entered By: Zandra Abts on 04/06/2022 11:15:14 -------------------------------------------------------------------------------- Pain Assessment Details Patient Name: Date of Service: Jeffery Arch. 04/06/2022 9:00 A M Medical Record Number: [id number] Patient Account Number: [id number] Date of Birth/Sex: Treating RN: 1986-06-28 (36 y.o. Elizebeth Koller Primary Care Aloys Hupfer: PA Zenovia Jordan, NO Other Clinician: Referring Ezariah Nace: Treating Jazmina Muhlenkamp/Extender: Elveria Rising in Treatment: 0 Active Problems Location of Pain Severity and Description of Pain Patient Has Paino No Site Locations Pain Management and Medication Current Pain Management: Electronic Signature(s) Signed: 04/07/2022 6:00:02 PM By: Zandra Abts RN, BSN Entered By: Zandra Abts on 04/06/2022 09:42:42 -------------------------------------------------------------------------------- Patient/Caregiver Education Details Patient Name: Date of Service: [name], Jeffery L. 6/13/2023andnbsp9:00 A M Medical Record Number: [id number] Patient Account Number: [id number] Date of Birth/Gender: Treating RN: 1986/09/30 (36 y.o. Elizebeth Koller Primary Care Physician: PA Fidela Juneau Other Clinician: Referring Physician: Treating Physician/Extender: Elveria Rising in Treatment: 0 Education Assessment Education Provided To: Patient Education Topics Provided Venous: Methods: Explain/Verbal Responses: State  content correctly Wound/Skin Impairment: Methods:  Explain/Verbal Responses: State content correctly Electronic Signature(s) Signed: 04/07/2022 6:00:02 PM By: Zandra Abts RN, BSN Entered By: Zandra Abts on 04/06/2022 11:15:27 -------------------------------------------------------------------------------- Wound Assessment Details Patient Name: Date of Service: Jeffery Arch. 04/06/2022 9:00 A M Medical Record Number: [id number] Patient Account Number: [id number] Date of Birth/Sex: Treating RN: 12-24-1985 (36 y.o. Elizebeth Koller Primary Care Conni Knighton: PA Zenovia Jordan, NO Other Clinician: Referring Josetta Wigal: Treating Patrcia Schnepp/Extender: Elveria Rising in Treatment: 0 Wound Status Wound Number: 1 Primary Venous Leg Ulcer Etiology: Wound Location: Left, Lateral Lower Leg Wound Status: Open Wounding Event: Blister Comorbid Congestive Heart Failure, Hypertension, Type II Diabetes, Date Acquired: 03/09/2022 History: Neuropathy Weeks Of Treatment: 0 Clustered Wound: No Photos Wound Measurements Length: (cm) 1.2 Width: (cm) 1 Depth: (cm) 0.1 Area: (cm) 0.942 Volume: (cm) 0.094 % Reduction in Area: 0% % Reduction in Volume: 0% Epithelialization: None Tunneling: No Undermining: No Wound Description Classification: Full Thickness Without Exposed Support Structures Wound Margin: Flat and Intact Exudate Amount: Medium Exudate Type: Serosanguineous Exudate Color: red, brown Foul Odor After Cleansing: No Slough/Fibrino Yes Wound Bed Granulation Amount: Small (1-33%) Exposed Structure Granulation Quality: Pink Fascia Exposed: No Necrotic Amount: Large (67-100%) Fat Layer (Subcutaneous Tissue) Exposed: Yes Necrotic Quality: Eschar, Adherent Slough Tendon Exposed: No Muscle Exposed: No Joint Exposed: No Bone Exposed: No Treatment Notes Wound #1 (Lower Leg) Wound Laterality: Left, Lateral Cleanser Soap and Water Discharge Instruction: May shower and wash wound with dial antibacterial soap  and water prior to dressing change. Wound Cleanser Discharge Instruction: Cleanse the wound with wound cleanser prior to applying a clean dressing using gauze sponges, not tissue or cotton balls. Peri-Wound Care Sween Lotion (Moisturizing lotion) Discharge Instruction: Apply moisturizing lotion as directed Topical Primary Dressing KerraCel Ag Gelling Fiber Dressing, 2x2 in (silver alginate) Discharge Instruction: Apply silver alginate to wound bed as instructed Secondary Dressing ABD Pad, 8x10 Discharge Instruction: Apply over primary dressing as directed. Woven Gauze Sponge, Non-Sterile 4x4 in Discharge Instruction: Apply over primary dressing as directed. Secured With Compression Wrap ThreePress (3 layer compression wrap) Discharge Instruction: Apply three layer compression as directed. Compression Stockings Add-Ons Electronic Signature(s) Signed: 04/07/2022 6:00:02 PM By: Zandra Abts RN, BSN Entered By: Zandra Abts on 04/06/2022 09:42:05 -------------------------------------------------------------------------------- Vitals Details Patient Name: Date of Service: Jeffery Arch. 04/06/2022 9:00 A M Medical Record Number: [id number] Patient Account Number: [id number] Date of Birth/Sex: Treating RN: 09-30-1986 (36 y.o. Elizebeth Koller Primary Care Daaiyah Baumert: PA Zenovia Jordan, NO Other Clinician: Referring Dao Memmott: Treating [place] Maulden/Extender: Elveria Rising in Treatment: 0 Vital Signs Time Taken: 09:25 Temperature (F): 98.2 Height (in): 74 Pulse (bpm): 103 Source: Stated Respiratory Rate (breaths/min): 18 Weight (lbs): 250 Blood Pressure (mmHg): 122/92 Source: Stated Reference Range: 80 - 120 mg / dl Body Mass Index (BMI): 32.1 Electronic Signature(s) Signed: 04/07/2022 6:00:02 PM By: Zandra Abts RN, BSN Entered By: Zandra Abts on 04/06/2022 09:28:11

## 2022-04-07 NOTE — Progress Notes (Signed)
CAREY, JOHNDROW ([id number]) Visit Report for 04/06/2022 Chief Complaint Document Details Patient Name: Date of Service: KAIEA, ESSELMAN 04/06/2022 9:00 A M Medical Record Number: [id number] Patient Account Number: [id number] Date of Birth/Sex: Treating RN: [birth year] (36 y.o. [name] Primary Care Provider: PA Zenovia Jordan, [place] Other Clinician: Referring Provider: Treating Provider/Extender: Elveria Rising in Treatment: 0 Information Obtained from: Patient Chief Complaint Patient seen for complaints of Non-Healing Wound. Electronic Signature(s) Signed: 04/06/2022 10:03:29 AM By: Duanne Guess MD FACS Entered By: Duanne Guess on 04/06/2022 10:03:29 -------------------------------------------------------------------------------- Debridement Details Patient Name: Date of Service: [name] 04/06/2022 9:00 A M Medical Record Number: [id number] Patient Account Number: [id number] Date of Birth/Sex: Treating RN: [birth year] (36 y.o. Elizebeth Koller Primary Care Provider: PA Zenovia Jordan, [place] Other Clinician: Referring Provider: Treating Provider/Extender: Elveria Rising in Treatment: 0 Debridement Performed for Assessment: Wound #1 Left,Lateral Lower Leg Performed By: Physician Duanne Guess, MD Debridement Type: Debridement Severity of Tissue Pre Debridement: Fat layer exposed Level of Consciousness (Pre-procedure): Awake and Alert Pre-procedure Verification/Time Out Yes - 09:57 Taken: Start Time: 09:57 T Area Debrided (L x W): otal 1.2 (cm) x 1 (cm) = 1.2 (cm) Tissue and other material debrided: Non-Viable, Eschar Level: Non-Viable Tissue Debridement Description: Selective/Open Wound Instrument: Curette Bleeding: Minimum Hemostasis Achieved: Pressure End Time: 09:59 Procedural Pain: 4 Post Procedural Pain: 2 Response to Treatment: Procedure was tolerated well Level of Consciousness (Post- Awake and  Alert procedure): Post Debridement Measurements of Total Wound Length: (cm) 1.2 Width: (cm) 1 Depth: (cm) 0.1 Volume: (cm) 0.094 Character of Wound/Ulcer Post Debridement: Improved Severity of Tissue Post Debridement: Fat layer exposed Post Procedure Diagnosis Same as Pre-procedure Electronic Signature(s) Signed: 04/06/2022 10:44:19 AM By: Duanne Guess MD FACS Signed: 04/07/2022 6:00:02 PM By: Zandra Abts RN, BSN Entered By: Zandra Abts on 04/06/2022 10:00:29 -------------------------------------------------------------------------------- HPI Details Patient Name: Date of Service: [name] 04/06/2022 9:00 A M Medical Record Number: [id number] Patient Account Number: [id number] Date of Birth/Sex: Treating RN: 1985-11-15 (36 y.o. [name] Primary Care Provider: PA TIENT, [place] Other Clinician: Referring Provider: Treating Provider/Extender: Elveria Rising in Treatment: 0 History of Present Illness HPI Description: ADMISSION 04/06/2022 This is a 36 year old man with type 2 diabetes mellitus (last A1c 7.3% on Mar 06, 2022), severe congestive heart failure with an ejection fraction of around 30%, diabetic nephropathy, hypertension, and recent hospital admission for sepsis due to cellulitis and fluid overload. He was discharged from the hospital and then subsequently developed a blister on his left lateral calf. This opened and as result he was referred to wound care. He has just been keeping a dry dressing on the site. It is fairly small and has a thin layer of dried eschar over the surface. There is no odor or drainage. He does have 2+ nonpitting edema to the bilateral lower extremities. He says that he has worn compression stockings in the past, but they are 15-20 mmHg pressure and are over 6 months old. 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 ABI in clinic today was noncompressible but he has palpable  pedal pulses. Electronic Signature(s) Signed: 04/06/2022 10:05:20 AM By: Duanne Guess MD FACS Entered By: Duanne Guess on 04/06/2022 10:05:19 -------------------------------------------------------------------------------- Physical Exam Details Patient Name: Date of Service: [name] 04/06/2022 9:00 A M Medical Record Number: [id number] Patient Account Number: [id number] Date of Birth/Sex: Treating RN: 23-Aug-1986 (36 y.o.  [name] Primary Care Provider: PA Zenovia Jordan, [place] Other Clinician: Referring Provider: Treating Provider/Extender: Elveria Rising in Treatment: 0 Constitutional . Slightly tachycardic, asymptomatic.. . . No acute distress. Respiratory Normal work of breathing on room air.. Cardiovascular . 2+ nonpitting edema to the bilateral lower extremities.. Notes 04/06/2022: On his left lateral calf, there is a crusted over area of eschar. Underneath this, some of the skin is healed and some is open but the fat layer is not exposed. There is no concern for infection. Electronic Signature(s) Signed: 04/06/2022 10:23:07 AM By: Duanne Guess MD FACS Signed: 04/06/2022 10:23:07 AM By: Duanne Guess MD FACS Entered By: Duanne Guess on 04/06/2022 10:23:07 -------------------------------------------------------------------------------- Physician Orders Details Patient Name: Date of Service: [name] 04/06/2022 9:00 A M Medical Record Number: [id number] Patient Account Number: [id number] Date of Birth/Sex: Treating RN: 1985-12-06 (36 y.o. Elizebeth Koller Primary Care Provider: PA Zenovia Jordan, [place] Other Clinician: Referring Provider: Treating Provider/Extender: Elveria Rising in Treatment: 0 Verbal / Phone Orders: No Diagnosis Coding ICD-10 Coding Code Description [phone] Non-pressure chronic ulcer of left calf limited to breakdown of skin I50.22 Chronic systolic (congestive) heart  failure E11.65 Type 2 diabetes mellitus with hyperglycemia E11.42 Type 2 diabetes mellitus with diabetic polyneuropathy L03.90 Cellulitis, unspecified E11.59 Type 2 diabetes mellitus with other circulatory complications Follow-up Appointments ppointment in 1 week. - Dr. Lady Gary - Room 1 - Tuesday 6/20 at 9:45 am Return A Bathing/ Shower/ Hygiene May shower with protection but do not get wound dressing(s) wet. - Ok to use Arts development officer, can buy at [facility], [facility], or [employer] Edema Control - Lymphedema / SCD / Other Elevate legs to the level of the heart or above for 30 minutes daily and/or when sitting, a frequency of: - throughout the day Avoid standing for long periods of time. Compression stocking or Garment 20-30 mm/Hg pressure to: - right leg daily Wound Treatment Wound #1 - Lower Leg Wound Laterality: Left, Lateral Cleanser: Soap and Water 1 x Per Week Discharge Instructions: May shower and wash wound with dial antibacterial soap and water prior to dressing change. Cleanser: Wound Cleanser 1 x Per Week Discharge Instructions: Cleanse the wound with wound cleanser prior to applying a clean dressing using gauze sponges, not tissue or cotton balls. Peri-Wound Care: Sween Lotion (Moisturizing lotion) 1 x Per Week Discharge Instructions: Apply moisturizing lotion as directed Prim Dressing: KerraCel Ag Gelling Fiber Dressing, 2x2 in (silver alginate) 1 x Per Week ary Discharge Instructions: Apply silver alginate to wound bed as instructed Secondary Dressing: ABD Pad, 8x10 1 x Per Week Discharge Instructions: Apply over primary dressing as directed. Secondary Dressing: Woven Gauze Sponge, Non-Sterile 4x4 in 1 x Per Week Discharge Instructions: Apply over primary dressing as directed. Compression Wrap: ThreePress (3 layer compression wrap) 1 x Per Week Discharge Instructions: Apply three layer compression as directed. Electronic Signature(s) Signed: 04/06/2022 10:44:19 AM By: Duanne Guess MD FACS Entered By: Duanne Guess on 04/06/2022 10:23:20 -------------------------------------------------------------------------------- Problem List Details Patient Name: Date of Service: [name] 04/06/2022 9:00 A M Medical Record Number: [id number] Patient Account Number: [id number] Date of Birth/Sex: Treating RN: 10-05-1986 (36 y.o. [name] Primary Care Provider: PA Zenovia Jordan, [place] Other Clinician: Referring Provider: Treating Provider/Extender: Elveria Rising in Treatment: 0 Active Problems ICD-10 Encounter Code Description Active Date MDM Diagnosis L97.221 Non-pressure chronic ulcer of left calf limited to breakdown of skin 04/06/2022 No Yes I50.22 Chronic systolic (congestive) heart failure 04/06/2022  No Yes E11.65 Type 2 diabetes mellitus with hyperglycemia 04/06/2022 No Yes E11.42 Type 2 diabetes mellitus with diabetic polyneuropathy 04/06/2022 No Yes L03.90 Cellulitis, unspecified 04/06/2022 No Yes E11.59 Type 2 diabetes mellitus with other circulatory complications 04/06/2022 No Yes Inactive Problems Resolved Problems Electronic Signature(s) Signed: 04/06/2022 10:03:00 AM By: Duanne Guess MD FACS Previous Signature: 04/06/2022 9:42:49 AM Version By: Duanne Guess MD FACS Entered By: Duanne Guess on 04/06/2022 10:02:59 -------------------------------------------------------------------------------- Progress Note Details Patient Name: Date of Service: [name] 04/06/2022 9:00 A M Medical Record Number: [id number] Patient Account Number: [id number] Date of Birth/Sex: Treating RN: 08-Dec-1985 (36 y.o. [name] Primary Care Provider: PA Zenovia Jordan, [place] Other Clinician: Referring Provider: Treating Provider/Extender: Elveria Rising in Treatment: 0 Subjective Chief Complaint Information obtained from Patient Patient seen for complaints of Non-Healing Wound. History of  Present Illness (HPI) ADMISSION 04/06/2022 This is a 36 year old man with type 2 diabetes mellitus (last A1c 7.3% on Mar 06, 2022), severe congestive heart failure with an ejection fraction of around 30%, diabetic nephropathy, hypertension, and recent hospital admission for sepsis due to cellulitis and fluid overload. He was discharged from the hospital and then subsequently developed a blister on his left lateral calf. This opened and as result he was referred to wound care. He has just been keeping a dry dressing on the site. It is fairly small and has a thin layer of dried eschar over the surface. There is no odor or drainage. He does have 2+ nonpitting edema to the bilateral lower extremities. He says that he has worn compression stockings in the past, but they are 15-20 mmHg pressure and are over 6 months old. 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 ABI in clinic today was noncompressible but he has palpable pedal pulses. Patient History Information obtained from Patient. Allergies No Known Drug Allergies Family History Cancer - Maternal Grandparents, Diabetes - Mother,Maternal Grandparents, Hypertension - Mother, Stroke - Mother. Social History Never smoker, Marital Status - Married, Alcohol Use - Never, Drug Use - No History, Caffeine Use - Rarely. Medical History Cardiovascular Patient has history of Congestive Heart Failure, Hypertension Endocrine Patient has history of Type II Diabetes Neurologic Patient has history of Neuropathy Patient is treated with Oral Agents. Blood sugar is tested. Medical A Surgical History Notes nd Cardiovascular Mitral and tricuspid valve regurgitation Genitourinary CKD-3 Review of Systems (ROS) Constitutional Symptoms (General Health) Denies complaints or symptoms of Fatigue, Fever, Chills, Marked Weight Change. Eyes Denies complaints or symptoms of Dry Eyes, Vision Changes, Glasses /  Contacts. Ear/Nose/Mouth/Throat Denies complaints or symptoms of Chronic sinus problems or rhinitis. Respiratory Denies complaints or symptoms of Chronic or frequent coughs, Shortness of Breath. Gastrointestinal Denies complaints or symptoms of Frequent diarrhea, Nausea, Vomiting. Musculoskeletal Denies complaints or symptoms of Muscle Pain, Muscle Weakness. Psychiatric Denies complaints or symptoms of Claustrophobia, Suicidal. Objective Constitutional Slightly tachycardic, asymptomatic.Marland Kitchen No acute distress. Vitals Time Taken: 9:25 AM, Height: 74 in, Source: Stated, Weight: 250 lbs, Source: Stated, BMI: 32.1, Temperature: 98.2 F, Pulse: 103 bpm, Respiratory Rate: 18 breaths/min, Blood Pressure: 122/92 mmHg. Respiratory Normal work of breathing on room air.. Cardiovascular 2+ nonpitting edema to the bilateral lower extremities.. General Notes: 04/06/2022: On his left lateral calf, there is a crusted over area of eschar. Underneath this, some of the skin is healed and some is open but the fat layer is not exposed. There is no concern for infection. Integumentary (Hair, Skin) Wound #  1 status is Open. Original cause of wound was Blister. The date acquired was: 03/09/2022. The wound is located on the Left,Lateral Lower Leg. The wound measures 1.2cm length x 1cm width x 0.1cm depth; 0.942cm^2 area and 0.094cm^3 volume. There is Fat Layer (Subcutaneous Tissue) exposed. There is no tunneling or undermining noted. There is a medium amount of serosanguineous drainage noted. The wound margin is flat and intact. There is small (1-33%) pink granulation within the wound bed. There is a large (67-100%) amount of necrotic tissue within the wound bed including Eschar and Adherent Slough. Assessment Active Problems ICD-10 Non-pressure chronic ulcer of left calf limited to breakdown of skin Chronic systolic (congestive) heart failure Type 2 diabetes mellitus with hyperglycemia Type 2 diabetes mellitus  with diabetic polyneuropathy Cellulitis, unspecified Type 2 diabetes mellitus with other circulatory complications Procedures Wound #1 Pre-procedure diagnosis of Wound #1 is a Venous Leg Ulcer located on the Left,Lateral Lower Leg .Severity of Tissue Pre Debridement is: Fat layer exposed. There was a Selective/Open Wound Non-Viable Tissue Debridement with a total area of 1.2 sq cm performed by Duanne Guess, MD. With the following instrument(s): Curette to remove Non-Viable tissue/material. Material removed includes Eschar. No specimens were taken. A time out was conducted at 09:57, prior to the start of the procedure. A Minimum amount of bleeding was controlled with Pressure. The procedure was tolerated well with a pain level of 4 throughout and a pain level of 2 following the procedure. Post Debridement Measurements: 1.2cm length x 1cm width x 0.1cm depth; 0.094cm^3 volume. Character of Wound/Ulcer Post Debridement is improved. Severity of Tissue Post Debridement is: Fat layer exposed. Post procedure Diagnosis Wound #1: Same as Pre-Procedure Pre-procedure diagnosis of Wound #1 is a Venous Leg Ulcer located on the Left,Lateral Lower Leg . There was a Three Layer Compression Therapy Procedure by Zandra Abts, RN. Post procedure Diagnosis Wound #1: Same as Pre-Procedure Plan Follow-up Appointments: Return Appointment in 1 week. - Dr. Lady Gary - Room 1 - Tuesday 6/20 at 9:45 am Bathing/ Shower/ Hygiene: May shower with protection but do not get wound dressing(s) wet. - Ok to use Arts development officer, can buy at [facility], [facility], or [employer] Edema Control - Lymphedema / SCD / Other: Elevate legs to the level of the heart or above for 30 minutes daily and/or when sitting, a frequency of: - throughout the day Avoid standing for long periods of time. Compression stocking or Garment 20-30 mm/Hg pressure to: - right leg daily WOUND #1: - Lower Leg Wound Laterality: Left, Lateral Cleanser: Soap and Water 1  x Per Week/ Discharge Instructions: May shower and wash wound with dial antibacterial soap and water prior to dressing change. Cleanser: Wound Cleanser 1 x Per Week/ Discharge Instructions: Cleanse the wound with wound cleanser prior to applying a clean dressing using gauze sponges, not tissue or cotton balls. Peri-Wound Care: Sween Lotion (Moisturizing lotion) 1 x Per Week/ Discharge Instructions: Apply moisturizing lotion as directed Prim Dressing: KerraCel Ag Gelling Fiber Dressing, 2x2 in (silver alginate) 1 x Per Week/ ary Discharge Instructions: Apply silver alginate to wound bed as instructed Secondary Dressing: ABD Pad, 8x10 1 x Per Week/ Discharge Instructions: Apply over primary dressing as directed. Secondary Dressing: Woven Gauze Sponge, Non-Sterile 4x4 in 1 x Per Week/ Discharge Instructions: Apply over primary dressing as directed. Com pression Wrap: ThreePress (3 layer compression wrap) 1 x Per Week/ Discharge Instructions: Apply three layer compression as directed. 04/06/2022: This is a 36 year old man with severe heart failure and fluid  overload which led to cellulitis and a new wound. On his left lateral calf, there is a crusted over area of eschar. Underneath this, some of the skin is healed and some is open but the fat layer is not exposed. There is no concern for infection. I used a curette to debride the eschar from the wound. We will apply silver alginate and 3 layer compression. He was advised to order a new pair of compression stockings at 20-30 mmHg pressure and wear this on his nonwounded leg during the day. I will see him in 1 week. Electronic Signature(s) Signed: 04/06/2022 10:24:14 AM By: Duanne Guess MD FACS Entered By: Duanne Guess on 04/06/2022 10:24:14 -------------------------------------------------------------------------------- HxROS Details Patient Name: Date of Service: [name] 04/06/2022 9:00 A M Medical Record Number:  [id number] Patient Account Number: [id number] Date of Birth/Sex: Treating RN: Jul 05, 1986 (36 y.o. Elizebeth Koller Primary Care Provider: PA Fidela [place] Other Clinician: Referring Provider: Treating Provider/Extender: Elveria Rising in Treatment: 0 Information Obtained From Patient Constitutional Symptoms (General Health) Complaints and Symptoms: Negative for: Fatigue; Fever; Chills; Marked Weight Change Eyes Complaints and Symptoms: Negative for: Dry Eyes; Vision Changes; Glasses / Contacts Ear/Nose/Mouth/Throat Complaints and Symptoms: Negative for: Chronic sinus problems or rhinitis Respiratory Complaints and Symptoms: Negative for: Chronic or frequent coughs; Shortness of Breath Gastrointestinal Complaints and Symptoms: Negative for: Frequent diarrhea; Nausea; Vomiting Musculoskeletal Complaints and Symptoms: Negative for: Muscle Pain; Muscle Weakness Psychiatric Complaints and Symptoms: Negative for: Claustrophobia; Suicidal Hematologic/Lymphatic Cardiovascular Medical History: Positive for: Congestive Heart Failure; Hypertension Past Medical History Notes: Mitral and tricuspid valve regurgitation Endocrine Medical History: Positive for: Type II Diabetes Treated with: Oral agents Blood sugar tested every day: Yes Tested : Genitourinary Medical History: Past Medical History Notes: CKD-3 Immunological Integumentary (Skin) Neurologic Medical History: Positive for: Neuropathy Oncologic Immunizations Pneumococcal Vaccine: Received Pneumococcal Vaccination: No Implantable Devices None Family and Social History Cancer: Yes - Maternal Grandparents; Diabetes: Yes - Mother,Maternal Grandparents; Hypertension: Yes - Mother; Stroke: Yes - Mother; Never smoker; Marital Status - Married; Alcohol Use: Never; Drug Use: No History; Caffeine Use: Rarely; Financial Concerns: No; Food, Clothing or Shelter Needs: No; Support System Lacking: No;  Transportation Concerns: No Electronic Signature(s) Signed: 04/06/2022 10:44:19 AM By: Duanne Guess MD FACS Signed: 04/07/2022 6:00:02 PM By: Zandra Abts RN, BSN Entered By: Zandra Abts on 04/06/2022 09:50:40 -------------------------------------------------------------------------------- SuperBill Details Patient Name: Date of Service: [name] 04/06/2022 Medical Record Number: [id number] Patient Account Number: [id number] Date of Birth/Sex: Treating RN: 1986/01/10 (36 y.o. [name] Primary Care Provider: PA Zenovia Jordan, [place] Other Clinician: Referring Provider: Treating Provider/Extender: Elveria Rising in Treatment: 0 Diagnosis Coding ICD-10 Codes Code Description [phone] Non-pressure chronic ulcer of left calf limited to breakdown of skin I50.22 Chronic systolic (congestive) heart failure E11.65 Type 2 diabetes mellitus with hyperglycemia E11.42 Type 2 diabetes mellitus with diabetic polyneuropathy L03.90 Cellulitis, unspecified E11.59 Type 2 diabetes mellitus with other circulatory complications Facility Procedures CPT4 Code: [id number] Description: 99213 - WOUND CARE VISIT-LEV 3 EST PT Modifier: 25 Quantity: 1 CPT4 Code: [id number] Description: 97597 - DEBRIDE WOUND 1ST 20 SQ CM OR < ICD-10 Diagnosis Description L97.221 Non-pressure chronic ulcer of left calf limited to breakdown of skin Modifier: Quantity: 1 Physician Procedures : CPT4 Code Description Modifier [id number] - WC PHYS LEVEL 4 - NEW PT 25 ICD-10 Diagnosis Description L97.221 Non-pressure chronic ulcer of left calf limited to breakdown of skin I50.22 Chronic systolic (congestive) heart failure L03.90 Cellulitis,  unspecified E11.65 Type 2 diabetes mellitus with hyperglycemia Quantity: 1 : [id number] 97597 - WC PHYS DEBR WO ANESTH 20 SQ CM 1 ICD-10 Diagnosis Description L97.221 Non-pressure chronic ulcer of left calf limited to breakdown of  skin Quantity: Electronic Signature(s) Signed: 04/06/2022 12:13:44 PM By: Duanne Guess MD FACS Signed: 04/07/2022 6:00:02 PM By: Zandra Abts RN, BSN Previous Signature: 04/06/2022 10:25:42 AM Version By: Duanne Guess MD FACS Entered By: Zandra Abts on 04/06/2022 11:16:18

## 2022-04-08 ENCOUNTER — Other Ambulatory Visit (HOSPITAL_COMMUNITY): Payer: Self-pay

## 2022-04-08 MED ORDER — ATORVASTATIN CALCIUM 40 MG PO TABS
40.0000 mg | ORAL_TABLET | Freq: Every day | ORAL | 3 refills | Status: AC
  Filled 2022-04-08: qty 90, 90d supply, fill #0

## 2022-04-08 MED ORDER — HYDRALAZINE HCL 25 MG PO TABS
25.0000 mg | ORAL_TABLET | Freq: Three times a day (TID) | ORAL | 3 refills | Status: DC
Start: 2022-04-08 — End: 2022-06-16
  Filled 2022-04-08: qty 260, 87d supply, fill #0

## 2022-04-08 MED ORDER — CARVEDILOL 25 MG PO TABS
12.5000 mg | ORAL_TABLET | Freq: Two times a day (BID) | ORAL | 3 refills | Status: DC
Start: 2022-04-08 — End: 2022-05-03

## 2022-04-08 MED ORDER — ISOSORBIDE MONONITRATE ER 30 MG PO TB24
30.0000 mg | ORAL_TABLET | Freq: Every day | ORAL | 3 refills | Status: DC
  Filled 2022-04-08: qty 90, 90d supply, fill #0

## 2022-04-12 ENCOUNTER — Other Ambulatory Visit (HOSPITAL_COMMUNITY): Payer: Self-pay

## 2022-04-13 ENCOUNTER — Encounter (HOSPITAL_BASED_OUTPATIENT_CLINIC_OR_DEPARTMENT_OTHER): Payer: 59 | Admitting: General Surgery

## 2022-04-13 DIAGNOSIS — E11622 Type 2 diabetes mellitus with other skin ulcer: Secondary | ICD-10-CM | POA: Diagnosis not present

## 2022-04-14 NOTE — Progress Notes (Signed)
Villa, Jeffery ([id number]) Visit Report for 04/13/2022 Chief Complaint Document Details Patient Name: Date of Service: Jeffery, Villa 04/13/2022 9:45 A M Medical Record Number: [id number] Patient Account Number: [id number] Date of Birth/Sex: Treating RN: 21-Aug-1986 (36 y.o. Jeffery Villa Primary Care Provider: PA Zenovia Jordan, [place] Other Clinician: Referring Provider: Treating Provider/Extender: Elveria Rising in Treatment: 1 Information Obtained from: Patient Chief Complaint Patient seen for complaints of Non-Healing Wound. Electronic Signature(s) Signed: 04/13/2022 10:43:06 AM By: Duanne Guess MD FACS Entered By: Duanne Guess on 04/13/2022 10:43:06 -------------------------------------------------------------------------------- Debridement Details Patient Name: Date of Service: Jeffery Villa 04/13/2022 9:45 A M Medical Record Number: [id number] Patient Account Number: [id number] Date of Birth/Sex: Treating RN: 1986-02-01 (36 y.o. Jeffery Villa Primary Care Provider: PA Zenovia Jordan, [place] Other Clinician: Referring Provider: Treating Provider/Extender: Elveria Rising in Treatment: 1 Debridement Performed for Assessment: Wound #1 Left,Lateral Lower Leg Performed By: Physician Duanne Guess, MD Debridement Type: Debridement Severity of Tissue Pre Debridement: Fat layer exposed Level of Consciousness (Pre-procedure): Awake and Alert Pre-procedure Verification/Time Out Yes - 10:35 Taken: Start Time: 10:35 Pain Control: Lidocaine 4% T opical Solution T Area Debrided (L x W): otal 1.1 (cm) x 0.7 (cm) = 0.77 (cm) Tissue and other material debrided: Non-Viable, Slough, Slough Level: Non-Viable Tissue Debridement Description: Selective/Open Wound Instrument: Curette Bleeding: Minimum Hemostasis Achieved: Pressure Procedural Pain: 3 Post Procedural Pain: 1 Response to Treatment: Procedure was tolerated  well Level of Consciousness (Post- Awake and Alert procedure): Post Debridement Measurements of Total Wound Length: (cm) 1.1 Width: (cm) 0.7 Depth: (cm) 0.1 Volume: (cm) 0.06 Character of Wound/Ulcer Post Debridement: Improved Severity of Tissue Post Debridement: Fat layer exposed Post Procedure Diagnosis Same as Pre-procedure Electronic Signature(s) Signed: 04/13/2022 11:12:49 AM By: Duanne Guess MD FACS Signed: 04/14/2022 6:21:16 PM By: Zenaida Deed RN, BSN Entered By: Zenaida Deed on 04/13/2022 10:37:51 -------------------------------------------------------------------------------- HPI Details Patient Name: Date of Service: Jeffery Villa 04/13/2022 9:45 A M Medical Record Number: [id number] Patient Account Number: [id number] Date of Birth/Sex: Treating RN: Aug 26, 1986 (36 y.o. Jeffery Villa Primary Care Provider: PA TIENT, [place] Other Clinician: Referring Provider: Treating Provider/Extender: Elveria Rising in Treatment: 1 History of Present Illness HPI Description: ADMISSION 04/06/2022 This is a 36 year old man with type 2 diabetes mellitus (last A1c 7.3% on Mar 06, 2022), severe congestive heart failure with an ejection fraction of around 30%, diabetic nephropathy, hypertension, and recent hospital admission for sepsis due to cellulitis and fluid overload. He was discharged from the hospital and then subsequently developed a blister on his left lateral calf. This opened and as result he was referred to wound care. He has just been keeping a dry dressing on the site. It is fairly small and has a thin layer of dried eschar over the surface. There is no odor or drainage. He does have 2+ nonpitting edema to the bilateral lower extremities. He says that he has worn compression stockings in the past, but they are 15-20 mmHg pressure and are over 6 months old. 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 ABI in clinic  today was noncompressible but he has palpable pedal pulses. 04/13/2022: The large area that was previously a blister has epithelialized. It is still very thin and friable skin, however. The slightly distal wound has some slough accumulation. The patient says that he has been very itchy since his last visit. Electronic Signature(s) Signed: 04/13/2022 10:44:08  AM By: Duanne Guess MD FACS Entered By: Duanne Guess on 04/13/2022 10:44:08 -------------------------------------------------------------------------------- Physical Exam Details Patient Name: Date of Service: Jeffery Villa 04/13/2022 9:45 A M Medical Record Number: [id number] Patient Account Number: [id number] Date of Birth/Sex: Treating RN: 08/03/1986 (36 y.o. Jeffery Villa Primary Care Provider: PA Zenovia Jordan, [place] Other Clinician: Referring Provider: Treating Provider/Extender: Elveria Rising in Treatment: 1 Constitutional ..... Respiratory Normal work of breathing on room air.. Notes 04/13/2022: The large area that was previously a blister has epithelialized. It is still very thin and friable skin, however. The slightly distal wound has some slough accumulation. Electronic Signature(s) Signed: 04/13/2022 10:45:07 AM By: Duanne Guess MD FACS Entered By: Duanne Guess on 04/13/2022 10:45:07 -------------------------------------------------------------------------------- Physician Orders Details Patient Name: Date of Service: Jeffery Villa 04/13/2022 9:45 A M Medical Record Number: [id number] Patient Account Number: [id number] Date of Birth/Sex: Treating RN: Jan 12, 1986 (36 y.o. Jeffery Villa Primary Care Provider: PA Zenovia Jordan, [place] Other Clinician: Referring Provider: Treating Provider/Extender: Elveria Rising in Treatment: 1 Verbal / Phone Orders: No Diagnosis Coding ICD-10 Coding Code Description [phone] Non-pressure chronic ulcer of left calf  limited to breakdown of skin I50.22 Chronic systolic (congestive) heart failure E11.65 Type 2 diabetes mellitus with hyperglycemia E11.42 Type 2 diabetes mellitus with diabetic polyneuropathy L03.90 Cellulitis, unspecified E11.59 Type 2 diabetes mellitus with other circulatory complications Follow-up Appointments ppointment in 1 week. - Dr. Lady Gary - Room 1 - Tuesday 6/27 at 8:15 am Return A Bathing/ Shower/ Hygiene May shower with protection but do not get wound dressing(s) wet. - Ok to use Arts development officer, can buy at [facility], [facility], or [employer] Edema Control - Lymphedema / SCD / Other Elevate legs to the level of the heart or above for 30 minutes daily and/or when sitting, a frequency of: - throughout the day Avoid standing for long periods of time. Exercise regularly Compression stocking or Garment 20-30 mm/Hg pressure to: - right leg daily Wound Treatment Wound #1 - Lower Leg Wound Laterality: Left, Lateral Cleanser: Soap and Water 1 x Per Week Discharge Instructions: May shower and wash wound with dial antibacterial soap and water prior to dressing change. Cleanser: Wound Cleanser 1 x Per Week Discharge Instructions: Cleanse the wound with wound cleanser prior to applying a clean dressing using gauze sponges, not tissue or cotton balls. Peri-Wound Care: Triamcinolone 15 (g) 1 x Per Week Discharge Instructions: Use triamcinolone 15 (g) as directed Peri-Wound Care: Sween Lotion (Moisturizing lotion) 1 x Per Week Discharge Instructions: Apply moisturizing lotion as directed Prim Dressing: KerraCel Ag Gelling Fiber Dressing, 2x2 in (silver alginate) 1 x Per Week ary Discharge Instructions: Apply silver alginate to wound bed as instructed Secondary Dressing: Woven Gauze Sponge, Non-Sterile 4x4 in 1 x Per Week Discharge Instructions: Apply over primary dressing as directed. Compression Wrap: ThreePress (3 layer compression wrap) 1 x Per Week Discharge Instructions: Apply three layer  compression as directed. Patient Medications llergies: No Known Drug Allergies A Notifications Medication Indication Start End prior to debridement 04/13/2022 lidocaine DOSE topical 4 % cream - cream topical Electronic Signature(s) Signed: 04/13/2022 11:12:49 AM By: Duanne Guess MD FACS Entered By: Duanne Guess on 04/13/2022 10:45:18 -------------------------------------------------------------------------------- Problem List Details Patient Name: Date of Service: Jeffery Villa 04/13/2022 9:45 A M Medical Record Number: [id number] Patient Account Number: [id number] Date of Birth/Sex: Treating RN: [birth year] (36 y.o. Jeffery Villa Primary Care Provider: PA Zenovia Jordan, [place] Other Clinician: Referring Provider: Treating Provider/Extender: Marijean Bravo  Weeks in Treatment: 1 Active Problems ICD-10 Encounter Code Description Active Date MDM Diagnosis L97.221 Non-pressure chronic ulcer of left calf limited to breakdown of skin 04/06/2022 No Yes I50.22 Chronic systolic (congestive) heart failure 04/06/2022 No Yes E11.65 Type 2 diabetes mellitus with hyperglycemia 04/06/2022 No Yes E11.42 Type 2 diabetes mellitus with diabetic polyneuropathy 04/06/2022 No Yes L03.90 Cellulitis, unspecified 04/06/2022 No Yes E11.59 Type 2 diabetes mellitus with other circulatory complications 04/06/2022 No Yes Inactive Problems Resolved Problems Electronic Signature(s) Signed: 04/13/2022 10:42:50 AM By: Duanne Guess MD FACS Entered By: Duanne Guess on 04/13/2022 10:42:50 -------------------------------------------------------------------------------- Progress Note Details Patient Name: Date of Service: Jeffery Villa 04/13/2022 9:45 A M Medical Record Number: [id number] Patient Account Number: [id number] Date of Birth/Sex: Treating RN: [birth year] (36 y.o. Jeffery Villa Primary Care Provider: PA Zenovia Jordan, [place] Other Clinician: Referring Provider: Treating  Provider/Extender: Elveria Rising in Treatment: 1 Subjective Chief Complaint Information obtained from Patient Patient seen for complaints of Non-Healing Wound. History of Present Illness (HPI) ADMISSION 04/06/2022 This is a 36 year old man with type 2 diabetes mellitus (last A1c 7.3% on Mar 06, 2022), severe congestive heart failure with an ejection fraction of around 30%, diabetic nephropathy, hypertension, and recent hospital admission for sepsis due to cellulitis and fluid overload. He was discharged from the hospital and then subsequently developed a blister on his left lateral calf. This opened and as result he was referred to wound care. He has just been keeping a dry dressing on the site. It is fairly small and has a thin layer of dried eschar over the surface. There is no odor or drainage. He does have 2+ nonpitting edema to the bilateral lower extremities. He says that he has worn compression stockings in the past, but they are 15-20 mmHg pressure and are over 6 months old. 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 ABI in clinic today was noncompressible but he has palpable pedal pulses. 04/13/2022: The large area that was previously a blister has epithelialized. It is still very thin and friable skin, however. The slightly distal wound has some slough accumulation. The patient says that he has been very itchy since his last visit. Patient History Information obtained from Patient. Family History Cancer - Maternal Grandparents, Diabetes - Mother,Maternal Grandparents, Hypertension - Mother, Stroke - Mother. Social History Never smoker, Marital Status - Married, Alcohol Use - Never, Drug Use - No History, Caffeine Use - Rarely. Medical History Cardiovascular Patient has history of Congestive Heart Failure, Hypertension Endocrine Patient has history of Type II Diabetes Neurologic Patient has history of Neuropathy Medical A  Surgical History Notes nd Cardiovascular Mitral and tricuspid valve regurgitation Genitourinary CKD-3 Objective Constitutional Vitals Time Taken: 10:21 AM, Height: 74 in, Weight: 250 lbs, BMI: 32.1, Temperature: 97.9 F, Pulse: 69 bpm, Respiratory Rate: 18 breaths/min, Blood Pressure: 127/91 mmHg, Capillary Blood Glucose: 146 mg/dl. General Notes: glucose per pt report this am Respiratory Normal work of breathing on room air.. General Notes: 04/13/2022: The large area that was previously a blister has epithelialized. It is still very thin and friable skin, however. The slightly distal wound has some slough accumulation. Integumentary (Hair, Skin) Wound #1 status is Open. Original cause of wound was Blister. The date acquired was: 03/09/2022. The wound has been in treatment 1 weeks. The wound is located on the Left,Lateral Lower Leg. The wound measures 1.1cm length x 0.7cm width x 0.1cm depth; 0.605cm^2 area and 0.06cm^3 volume. There is  Fat Layer (Subcutaneous Tissue) exposed. There is no tunneling or undermining noted. There is a medium amount of serosanguineous drainage noted. The wound margin is flat and intact. There is small (1-33%) pink granulation within the wound bed. There is a large (67-100%) amount of necrotic tissue within the wound bed including Adherent Slough. Assessment Active Problems ICD-10 Non-pressure chronic ulcer of left calf limited to breakdown of skin Chronic systolic (congestive) heart failure Type 2 diabetes mellitus with hyperglycemia Type 2 diabetes mellitus with diabetic polyneuropathy Cellulitis, unspecified Type 2 diabetes mellitus with other circulatory complications Procedures Wound #1 Pre-procedure diagnosis of Wound #1 is a Venous Leg Ulcer located on the Left,Lateral Lower Leg .Severity of Tissue Pre Debridement is: Fat layer exposed. There was a Selective/Open Wound Non-Viable Tissue Debridement with a total area of 0.77 sq cm performed by Duanne Guess, MD. With the following instrument(s): Curette to remove Non-Viable tissue/material. Material removed includes [facility] after achieving pain control using Lidocaine 4% Topical Solution. No specimens were taken. A time out was conducted at 10:35, prior to the start of the procedure. A Minimum amount of bleeding was controlled with Pressure. The procedure was tolerated well with a pain level of 3 throughout and a pain level of 1 following the procedure. Post Debridement Measurements: 1.1cm length x 0.7cm width x 0.1cm depth; 0.06cm^3 volume. Character of Wound/Ulcer Post Debridement is improved. Severity of Tissue Post Debridement is: Fat layer exposed. Post procedure Diagnosis Wound #1: Same as Pre-Procedure Plan Follow-up Appointments: Return Appointment in 1 week. - Dr. Lady Gary - Room 1 - Tuesday 6/27 at 8:15 am Bathing/ Shower/ Hygiene: May shower with protection but do not get wound dressing(s) wet. - Ok to use Arts development officer, can buy at [facility], [facility], or [employer] Edema Control - Lymphedema / SCD / Other: Elevate legs to the level of the heart or above for 30 minutes daily and/or when sitting, a frequency of: - throughout the day Avoid standing for long periods of time. Exercise regularly Compression stocking or Garment 20-30 mm/Hg pressure to: - right leg daily The following medication(s) was prescribed: lidocaine topical 4 % cream cream topical for prior to debridement was prescribed at facility WOUND #1: - Lower Leg Wound Laterality: Left, Lateral Cleanser: Soap and Water 1 x Per Week/ Discharge Instructions: May shower and wash wound with dial antibacterial soap and water prior to dressing change. Cleanser: Wound Cleanser 1 x Per Week/ Discharge Instructions: Cleanse the wound with wound cleanser prior to applying a clean dressing using gauze sponges, not tissue or cotton balls. Peri-Wound Care: Triamcinolone 15 (g) 1 x Per Week/ Discharge Instructions: Use triamcinolone 15 (g) as  directed Peri-Wound Care: Sween Lotion (Moisturizing lotion) 1 x Per Week/ Discharge Instructions: Apply moisturizing lotion as directed Prim Dressing: KerraCel Ag Gelling Fiber Dressing, 2x2 in (silver alginate) 1 x Per Week/ ary Discharge Instructions: Apply silver alginate to wound bed as instructed Secondary Dressing: Woven Gauze Sponge, Non-Sterile 4x4 in 1 x Per Week/ Discharge Instructions: Apply over primary dressing as directed. Com pression Wrap: ThreePress (3 layer compression wrap) 1 x Per Week/ Discharge Instructions: Apply three layer compression as directed. 04/13/2022: The large area that was previously a blister has epithelialized. It is still very thin and friable skin, however. The slightly distal wound has some slough accumulation. I debrided slough from the remaining open part of his wound. We will continue using silver alginate and 3 layer compression. We will also add triamcinolone to his periwound skin to help with the  itching. We will also change the contact layer of his dressing to the cotton rather than Kerlix. Follow-up in 1 week. Electronic Signature(s) Signed: 04/13/2022 10:45:59 AM By: Duanne Guess MD FACS Entered By: Duanne Guess on 04/13/2022 10:45:59 -------------------------------------------------------------------------------- HxROS Details Patient Name: Date of Service: Jeffery Villa 04/13/2022 9:45 A M Medical Record Number: [id number] Patient Account Number: [id number] Date of Birth/Sex: Treating RN: 04-Feb-1986 (36 y.o. Jeffery Villa Primary Care Provider: PA Zenovia Jordan, [place] Other Clinician: Referring Provider: Treating Provider/Extender: Elveria Rising in Treatment: 1 Information Obtained From Patient Cardiovascular Medical History: Positive for: Congestive Heart Failure; Hypertension Past Medical History Notes: Mitral and tricuspid valve regurgitation Endocrine Medical History: Positive for: Type II  Diabetes Treated with: Oral agents Blood sugar tested every day: Yes Tested : Genitourinary Medical History: Past Medical History Notes: CKD-3 Neurologic Medical History: Positive for: Neuropathy Immunizations Pneumococcal Vaccine: Received Pneumococcal Vaccination: No Implantable Devices None Family and Social History Cancer: Yes - Maternal Grandparents; Diabetes: Yes - Mother,Maternal Grandparents; Hypertension: Yes - Mother; Stroke: Yes - Mother; Never smoker; Marital Status - Married; Alcohol Use: Never; Drug Use: No History; Caffeine Use: Rarely; Financial Concerns: No; Food, Clothing or Shelter Needs: No; Support System Lacking: No; Transportation Concerns: No Electronic Signature(s) Signed: 04/13/2022 11:12:49 AM By: Duanne Guess MD FACS Signed: 04/14/2022 6:21:16 PM By: Zenaida Deed RN, BSN Entered By: Duanne Guess on 04/13/2022 10:44:41 -------------------------------------------------------------------------------- SuperBill Details Patient Name: Date of Service: Jeffery Villa 04/13/2022 Medical Record Number: [id number] Patient Account Number: [id number] Date of Birth/Sex: Treating RN: 12/21/1985 (36 y.o. Jeffery Villa Primary Care Provider: PA Zenovia Jordan, [place] Other Clinician: Referring Provider: Treating Provider/Extender: Elveria Rising in Treatment: 1 Diagnosis Coding ICD-10 Codes Code Description [phone] Non-pressure chronic ulcer of left calf limited to breakdown of skin I50.22 Chronic systolic (congestive) heart failure E11.65 Type 2 diabetes mellitus with hyperglycemia E11.42 Type 2 diabetes mellitus with diabetic polyneuropathy L03.90 Cellulitis, unspecified E11.59 Type 2 diabetes mellitus with other circulatory complications Facility Procedures CPT4 Code: [id number] Description: [phone] - DEBRIDE WOUND 1ST 20 SQ CM OR < ICD-10 Diagnosis Description L97.221 Non-pressure chronic ulcer of left calf limited to  breakdown of skin Modifier: Quantity: 1 Physician Procedures : CPT4 Code Description Modifier [id number] - WC PHYS LEVEL 3 - EST PT 25 ICD-10 Diagnosis Description L97.221 Non-pressure chronic ulcer of left calf limited to breakdown of skin I50.22 Chronic systolic (congestive) heart failure E11.65 Type 2  diabetes mellitus with hyperglycemia L03.90 Cellulitis, unspecified Quantity: 1 : [id number] - WC PHYS DEBR WO ANESTH 20 SQ CM ICD-10 Diagnosis Description L97.221 Non-pressure chronic ulcer of left calf limited to breakdown of skin Quantity: 1 Electronic Signature(s) Signed: 04/13/2022 10:46:38 AM By: Duanne Guess MD FACS Entered By: Duanne Guess on 04/13/2022 10:46:38

## 2022-04-14 NOTE — Progress Notes (Signed)
GLENDEL, JAGGERS ([id number]) Visit Report for 04/13/2022 Arrival Information Details Patient Name: Date of Service: Jeffery Villa, Jeffery Villa 04/13/2022 9:45 A M Medical Record Number: [id number] Patient Account Number: [id number] Date of Birth/Sex: Treating RN: 08-12-1986 (36 y.o. Jeffery Villa Primary Care Kyliah Deanda: PA Zenovia Jordan, [place] Other Clinician: Referring Christl Fessenden: Treating Robbi Scurlock/Extender: Elveria Rising in Treatment: 1 Visit Information History Since Last Visit Added or deleted any medications: No Patient Arrived: Ambulatory Any new allergies or adverse reactions: No Arrival Time: 10:20 Had a fall or experienced change in No Accompanied By: self activities of daily living that may affect Transfer Assistance: None risk of falls: Patient Identification Verified: Yes Signs or symptoms of abuse/neglect since last visito No Secondary Verification Process Completed: Yes Hospitalized since last visit: No Patient Requires Transmission-Based Precautions: No Implantable device outside of the clinic excluding No Patient Has Alerts: Yes cellular tissue based products placed in the center Patient Alerts: L ABI non compressible since last visit: Has Dressing in Place as Prescribed: Yes Has Compression in Place as Prescribed: Yes Pain Present Now: No Electronic Signature(s) Signed: 04/14/2022 6:21:16 PM By: Zenaida Deed RN, BSN Entered By: Zenaida Deed on 04/13/2022 10:21:18 -------------------------------------------------------------------------------- Encounter Discharge Information Details Patient Name: Date of Service: Jeffery Villa 04/13/2022 9:45 A M Medical Record Number: [id number] Patient Account Number: [id number] Date of Birth/Sex: Treating RN: 23-Aug-1986 (36 y.o. Jeffery Villa Primary Care Ezell Melikian: PA Zenovia Jordan, [place] Other Clinician: Referring Nesta Kimple: Treating Salimata Christenson/Extender: Elveria Rising in  Treatment: 1 Encounter Discharge Information Items Post Procedure Vitals Discharge Condition: Stable Temperature (F): 97.9 Ambulatory Status: Ambulatory Pulse (bpm): 69 Discharge Destination: Home Respiratory Rate (breaths/min): 18 Transportation: Private Auto Blood Pressure (mmHg): 127/91 Accompanied By: self Schedule Follow-up Appointment: Yes Clinical Summary of Care: Patient Declined Electronic Signature(s) Signed: 04/14/2022 6:21:16 PM By: Zenaida Deed RN, BSN Entered By: Zenaida Deed on 04/13/2022 10:58:02 -------------------------------------------------------------------------------- Lower Extremity Assessment Details Patient Name: Date of Service: Jeffery Villa 04/13/2022 9:45 A M Medical Record Number: [id number] Patient Account Number: [id number] Date of Birth/Sex: Treating RN: 02-Oct-1986 (36 y.o. Jeffery Villa Primary Care Jonice Cerra: PA Zenovia Jordan, [place] Other Clinician: Referring Kwasi Joung: Treating Trevyon Swor/Extender: Elveria Rising in Treatment: 1 Edema Assessment Assessed: [Left: No] [Right: No] Edema: [Left: Ye] [Right: s] Calf Left: Right: Point of Measurement: 35 cm From Medial Instep 38.7 cm Ankle Left: Right: Point of Measurement: 9 cm From Medial Instep 26.5 cm Vascular Assessment Pulses: Dorsalis Pedis Palpable: [Left:Yes] Electronic Signature(s) Signed: 04/14/2022 6:21:16 PM By: Zenaida Deed RN, BSN Entered By: Zenaida Deed on 04/13/2022 10:29:30 -------------------------------------------------------------------------------- Multi Wound Chart Details Patient Name: Date of Service: Jeffery Villa 04/13/2022 9:45 A M Medical Record Number: [id number] Patient Account Number: [id number] Date of Birth/Sex: Treating RN: May 14, 1986 (36 y.o. Jeffery Villa Primary Care Tamana Hatfield: PA Zenovia Jordan, [place] Other Clinician: Referring Taygen Newsome: Treating Hakiem Malizia/Extender: Elveria Rising in  Treatment: 1 Vital Signs Height(in): 74 Capillary Blood Glucose(mg/dl): 270 Weight(lbs): 623 Pulse(bpm): 69 Body Mass Index(BMI): 32.1 Blood Pressure(mmHg): 127/91 Temperature(F): 97.9 Respiratory Rate(breaths/min): 18 Photos: [N/A:N/A] Left, Lateral Lower Leg N/A N/A Wound Location: Blister N/A N/A Wounding Event: Venous Leg Ulcer N/A N/A Primary Etiology: Congestive Heart Failure, N/A N/A Comorbid History: Hypertension, Type II Diabetes, Neuropathy 03/09/2022 N/A N/A Date Acquired: 1 N/A N/A Weeks of Treatment: Open N/A N/A Wound Status: No N/A N/A Wound Recurrence: 1.1x0.7x0.1 N/A N/A Measurements L x W x D (cm) 0.605 N/A N/A A (cm) :  rea 0.06 N/A N/A Volume (cm) : 35.80% N/A N/A % Reduction in A rea: 36.20% N/A N/A % Reduction in Volume: Full Thickness Without Exposed N/A N/A Classification: Support Structures Medium N/A N/A Exudate A mount: Serosanguineous N/A N/A Exudate Type: red, brown N/A N/A Exudate Color: Flat and Intact N/A N/A Wound Margin: Small (1-33%) N/A N/A Granulation A mount: Pink N/A N/A Granulation Quality: Large (67-100%) N/A N/A Necrotic A mount: Fat Layer (Subcutaneous Tissue): Yes N/A N/A Exposed Structures: Fascia: No Tendon: No Muscle: No Joint: No Bone: No Small (1-33%) N/A N/A Epithelialization: Debridement - Selective/Open Wound N/A N/A Debridement: Pre-procedure Verification/Time Out 10:35 N/A N/A Taken: Lidocaine 4% Topical Solution N/A N/A Pain Control: Slough N/A N/A Tissue Debrided: Non-Viable Tissue N/A N/A Level: 0.77 N/A N/A Debridement A (sq cm): rea Curette N/A N/A Instrument: Minimum N/A N/A Bleeding: Pressure N/A N/A Hemostasis A chieved: 3 N/A N/A Procedural Pain: 1 N/A N/A Post Procedural Pain: Procedure was tolerated well N/A N/A Debridement Treatment Response: 1.1x0.7x0.1 N/A N/A Post Debridement Measurements L x W x D (cm) 0.06 N/A N/A Post Debridement Volume:  (cm) Debridement N/A N/A Procedures Performed: Treatment Notes Electronic Signature(s) Signed: 04/13/2022 10:43:00 AM By: Duanne Guess MD FACS Signed: 04/14/2022 6:21:16 PM By: Zenaida Deed RN, BSN Entered By: Duanne Guess on 04/13/2022 10:43:00 -------------------------------------------------------------------------------- Multi-Disciplinary Care Plan Details Patient Name: Date of Service: Jeffery Villa 04/13/2022 9:45 A M Medical Record Number: [id number] Patient Account Number: [id number] Date of Birth/Sex: Treating RN: Feb 24, 1986 (36 y.o. Jeffery Villa Primary Care Dinia Joynt: PA Zenovia Jordan, [place] Other Clinician: Referring Jung Yurchak: Treating Kirstein Baxley/Extender: Elveria Rising in Treatment: 1 Multidisciplinary Care Plan reviewed with physician Active Inactive Nutrition Nursing Diagnoses: Impaired glucose control: actual or potential Potential for alteratiion in Nutrition/Potential for imbalanced nutrition Goals: Patient/caregiver will maintain therapeutic glucose control Date Initiated: 04/06/2022 Target Resolution Date: 05/07/2022 Goal Status: Active Interventions: Assess HgA1c results as ordered upon admission and as needed Assess patient nutrition upon admission and as needed per policy Provide education on elevated blood sugars and impact on wound healing Provide education on nutrition Notes: Venous Leg Ulcer Nursing Diagnoses: Knowledge deficit related to disease process and management Potential for venous Insuffiency (use before diagnosis confirmed) Goals: Patient will maintain optimal edema control Date Initiated: 04/06/2022 Target Resolution Date: 05/07/2022 Goal Status: Active Interventions: Assess peripheral edema status every visit. Compression as ordered Provide education on venous insufficiency Notes: Wound/Skin Impairment Nursing Diagnoses: Impaired tissue integrity Knowledge deficit related to  ulceration/compromised skin integrity Goals: Patient/caregiver will verbalize understanding of skin care regimen Date Initiated: 04/06/2022 Target Resolution Date: 05/07/2022 Goal Status: Active Interventions: Assess patient/caregiver ability to obtain necessary supplies Assess patient/caregiver ability to perform ulcer/skin care regimen upon admission and as needed Assess ulceration(s) every visit Provide education on ulcer and skin care Notes: Electronic Signature(s) Signed: 04/14/2022 6:21:16 PM By: Zenaida Deed RN, BSN Entered By: Zenaida Deed on 04/13/2022 10:33:48 -------------------------------------------------------------------------------- Pain Assessment Details Patient Name: Date of Service: Jeffery Villa 04/13/2022 9:45 A M Medical Record Number: [id number] Patient Account Number: [id number] Date of Birth/Sex: Treating RN: [birth year] (36 y.o. Jeffery Villa Primary Care Mirjana Tarleton: PA Zenovia Jordan, [place] Other Clinician: Referring Marylon Verno: Treating Darwin Rothlisberger/Extender: Elveria Rising in Treatment: 1 Active Problems Location of Pain Severity and Description of Pain Patient Has Paino No Site Locations Rate the pain. Current Pain Level: 0 Pain Management and Medication Current Pain Management: Notes reports leg itching under wrap Electronic Signature(s) Signed: 04/14/2022 6:21:16 PM By:  Zenaida Deed RN, BSN Entered By: Zenaida Deed on 04/13/2022 10:22:14 -------------------------------------------------------------------------------- Patient/Caregiver Education Details Patient Name: Date of Service: Jeffery Villa 6/20/2023andnbsp9:45 A M Medical Record Number: [id number] Patient Account Number: [id number] Date of Birth/Gender: Treating RN: [birth year] (36 y.o. Jeffery Villa Primary Care Physician: PA Zenovia Jordan, [place] Other Clinician: Referring Physician: Treating Physician/Extender: Elveria Rising  in Treatment: 1 Education Assessment Education Provided To: Patient Education Topics Provided Elevated Blood Sugar/ Impact on Healing: Methods: Explain/Verbal Responses: Reinforcements needed, State content correctly Venous: Methods: Explain/Verbal Responses: Reinforcements needed, State content correctly Wound/Skin Impairment: Methods: Explain/Verbal Responses: Reinforcements needed, State content correctly Electronic Signature(s) Signed: 04/14/2022 6:21:16 PM By: Zenaida Deed RN, BSN Entered By: Zenaida Deed on 04/13/2022 10:34:16 -------------------------------------------------------------------------------- Wound Assessment Details Patient Name: Date of Service: Jeffery Villa 04/13/2022 9:45 A M Medical Record Number: [id number] Patient Account Number: [id number] Date of Birth/Sex: Treating RN: 1986-08-24 (36 y.o. Jeffery Villa Primary Care Jasper Ruminski: PA Zenovia Jordan, [place] Other Clinician: Referring Elzy Tomasello: Treating Jamarie Joplin/Extender: Elveria Rising in Treatment: 1 Wound Status Wound Number: 1 Primary Venous Leg Ulcer Etiology: Wound Location: Left, Lateral Lower Leg Wound Status: Open Wounding Event: Blister Comorbid Congestive Heart Failure, Hypertension, Type II Diabetes, Date Acquired: 03/09/2022 History: Neuropathy Weeks Of Treatment: 1 Clustered Wound: No Photos Wound Measurements Length: (cm) 1.1 Width: (cm) 0.7 Depth: (cm) 0.1 Area: (cm) 0.605 Volume: (cm) 0.06 % Reduction in Area: 35.8% % Reduction in Volume: 36.2% Epithelialization: Small (1-33%) Tunneling: No Undermining: No Wound Description Classification: Full Thickness Without Exposed Support Structures Wound Margin: Flat and Intact Exudate Amount: Medium Exudate Type: Serosanguineous Exudate Color: red, brown Foul Odor After Cleansing: No Slough/Fibrino Yes Wound Bed Granulation Amount: Small (1-33%) Exposed Structure Granulation Quality:  Pink Fascia Exposed: No Necrotic Amount: Large (67-100%) Fat Layer (Subcutaneous Tissue) Exposed: Yes Necrotic Quality: Adherent Slough Tendon Exposed: No Muscle Exposed: No Joint Exposed: No Bone Exposed: No Treatment Notes Wound #1 (Lower Leg) Wound Laterality: Left, Lateral Cleanser Soap and Water Discharge Instruction: May shower and wash wound with dial antibacterial soap and water prior to dressing change. Wound Cleanser Discharge Instruction: Cleanse the wound with wound cleanser prior to applying a clean dressing using gauze sponges, not tissue or cotton balls. Peri-Wound Care Triamcinolone 15 (g) Discharge Instruction: Use triamcinolone 15 (g) as directed Sween Lotion (Moisturizing lotion) Discharge Instruction: Apply moisturizing lotion as directed Topical Primary Dressing KerraCel Ag Gelling Fiber Dressing, 2x2 in (silver alginate) Discharge Instruction: Apply silver alginate to wound bed as instructed Secondary Dressing Woven Gauze Sponge, Non-Sterile 4x4 in Discharge Instruction: Apply over primary dressing as directed. Secured With Compression Wrap ThreePress (3 layer compression wrap) Discharge Instruction: Apply three layer compression as directed. Compression Stockings Add-Ons Electronic Signature(s) Signed: 04/14/2022 6:21:16 PM By: Zenaida Deed RN, BSN Entered By: Zenaida Deed on 04/13/2022 10:31:34 -------------------------------------------------------------------------------- Vitals Details Patient Name: Date of Service: Jeffery Villa 04/13/2022 9:45 A M Medical Record Number: [id number] Patient Account Number: [id number] Date of Birth/Sex: Treating RN: November 02, 1985 (36 y.o. Jeffery Villa Primary Care Vertis Scheib: PA Zenovia Jordan, [place] Other Clinician: Referring Merl Guardino: Treating Teyah Rossy/Extender: Elveria Rising in Treatment: 1 Vital Signs Time Taken: 10:21 Temperature (F): 97.9 Height (in): 74 Pulse (bpm):  69 Weight (lbs): 250 Respiratory Rate (breaths/min): 18 Body Mass Index (BMI): 32.1 Blood Pressure (mmHg): 127/91 Capillary Blood Glucose (mg/dl): 329 Reference Range: 80 - 120 mg / dl Notes glucose per pt report this am Electronic Signature(s) Signed: 04/14/2022 6:21:16 PM By:  Zenaida Deed RN, BSN Entered By: Zenaida Deed on 04/13/2022 10:21:54

## 2022-04-16 ENCOUNTER — Other Ambulatory Visit (HOSPITAL_COMMUNITY): Payer: Self-pay

## 2022-04-20 ENCOUNTER — Encounter (HOSPITAL_BASED_OUTPATIENT_CLINIC_OR_DEPARTMENT_OTHER): Payer: 59 | Admitting: General Surgery

## 2022-04-20 ENCOUNTER — Other Ambulatory Visit (HOSPITAL_COMMUNITY): Payer: Self-pay

## 2022-04-20 ENCOUNTER — Ambulatory Visit (HOSPITAL_COMMUNITY)
Admission: RE | Admit: 2022-04-20 | Discharge: 2022-04-20 | Disposition: A | Discharge status: Home / Self Care | Payer: 59 | Source: Ambulatory Visit | Attending: Cardiology | Admitting: Cardiology

## 2022-04-20 ENCOUNTER — Encounter (HOSPITAL_COMMUNITY): Payer: Self-pay

## 2022-04-20 VITALS — BP 132/98 | HR 100 | Wt 272.4 lb

## 2022-04-20 DIAGNOSIS — I5023 Acute on chronic systolic (congestive) heart failure: Secondary | ICD-10-CM | POA: Diagnosis not present

## 2022-04-20 DIAGNOSIS — G4733 Obstructive sleep apnea (adult) (pediatric): Secondary | ICD-10-CM | POA: Diagnosis not present

## 2022-04-20 DIAGNOSIS — I2510 Atherosclerotic heart disease of native coronary artery without angina pectoris: Secondary | ICD-10-CM | POA: Diagnosis not present

## 2022-04-20 DIAGNOSIS — I081 Rheumatic disorders of both mitral and tricuspid valves: Secondary | ICD-10-CM | POA: Diagnosis not present

## 2022-04-20 DIAGNOSIS — I252 Old myocardial infarction: Secondary | ICD-10-CM | POA: Diagnosis not present

## 2022-04-20 DIAGNOSIS — Z79899 Other long term (current) drug therapy: Secondary | ICD-10-CM | POA: Insufficient documentation

## 2022-04-20 DIAGNOSIS — Z794 Long term (current) use of insulin: Secondary | ICD-10-CM | POA: Insufficient documentation

## 2022-04-20 DIAGNOSIS — N183 Chronic kidney disease, stage 3 unspecified: Secondary | ICD-10-CM | POA: Insufficient documentation

## 2022-04-20 DIAGNOSIS — E785 Hyperlipidemia, unspecified: Secondary | ICD-10-CM | POA: Insufficient documentation

## 2022-04-20 DIAGNOSIS — I272 Pulmonary hypertension, unspecified: Secondary | ICD-10-CM | POA: Diagnosis not present

## 2022-04-20 DIAGNOSIS — E1122 Type 2 diabetes mellitus with diabetic chronic kidney disease: Secondary | ICD-10-CM | POA: Diagnosis not present

## 2022-04-20 DIAGNOSIS — I130 Hypertensive heart and chronic kidney disease with heart failure and stage 1 through stage 4 chronic kidney disease, or unspecified chronic kidney disease: Secondary | ICD-10-CM | POA: Diagnosis present

## 2022-04-20 DIAGNOSIS — I5022 Chronic systolic (congestive) heart failure: Secondary | ICD-10-CM | POA: Diagnosis not present

## 2022-04-20 DIAGNOSIS — Z7984 Long term (current) use of oral hypoglycemic drugs: Secondary | ICD-10-CM | POA: Insufficient documentation

## 2022-04-20 DIAGNOSIS — E11622 Type 2 diabetes mellitus with other skin ulcer: Secondary | ICD-10-CM | POA: Diagnosis not present

## 2022-04-20 LAB — BASIC METABOLIC PANEL
Anion gap: 7 (ref 5–15)
BUN: 55 mg/dL — ABNORMAL HIGH (ref 6–20)
CO2: 22 mmol/L (ref 22–32)
Calcium: 8.5 mg/dL — ABNORMAL LOW (ref 8.9–10.3)
Chloride: 109 mmol/L (ref 98–111)
Creatinine, Ser: 1.9 mg/dL — ABNORMAL HIGH (ref 0.61–1.24)
GFR, Estimated: 47 mL/min — ABNORMAL LOW (ref >60)
Glucose, Bld: 210 mg/dL — ABNORMAL HIGH (ref 70–99)
Potassium: 4.9 mmol/L (ref 3.5–5.1)
Sodium: 138 mmol/L (ref 135–145)

## 2022-04-20 LAB — BRAIN NATRIURETIC PEPTIDE: B Natriuretic Peptide: 3149.6 pg/mL — ABNORMAL HIGH (ref 0.0–100.0)

## 2022-04-20 MED ORDER — FUROSEMIDE 10 MG/ML IJ SOLN
80.0000 mg | Freq: Once | INTRAMUSCULAR | Status: AC
  Administered 2022-04-20: 80 mg via INTRAVENOUS

## 2022-04-20 MED ORDER — DAPAGLIFLOZIN PROPANEDIOL 10 MG PO TABS
10.0000 mg | ORAL_TABLET | Freq: Every day | ORAL | 3 refills | Status: DC
  Filled 2022-04-20: qty 30, 30d supply, fill #0

## 2022-04-20 MED ORDER — POTASSIUM CHLORIDE 20 MEQ PO PACK
20.0000 meq | PACK | Freq: Once | ORAL | Status: AC
  Administered 2022-04-20: 20 meq via ORAL

## 2022-04-20 NOTE — Progress Notes (Signed)
Pt given 20 meq PO Potassium, tolerated well. Urine output 200 cc, Robbie Lis, PA aware, pt d/c'd home with instructions/AVS and f/u appt for Friday 6/30  Provided patient education on Furoscix using demo kits and Furoscix video, QR code provided on AVS for further viewing. Pt's insurance will not cover Furoscix, pt provided 2 sample kits to use.  Medication Samples have been provided to the patient.  Drug name: Furoscix       Strength: 80 mg        Qty: 2  LOT: [id number]  Exp.Date: 03/11/23, 12/23/23  Dosing instructions: use 1 kit Wednesday 6/28 and 1 kit on Thursday 6/29  The patient has been instructed regarding the correct time, dose, and frequency of taking this medication, including desired effects and most common side effects.   Cody Oliger 1:07 PM 04/20/2022

## 2022-04-20 NOTE — Progress Notes (Signed)
ADVANCED HF CLINIC CLINIC NOTE   PCP:  HF Cardiologist: Dr. Shirlee Latch  HPI: Jeffery Villa is a 36 y.o. male with past medical history of hypertension, hyperlipidemia, insulin-dependent diabetes mellitus, CKD stage III, OSA and chronic systolic heart failure.    He was seen 6/22 by PCP, referred to cardiology for echo. Echo 9/22 showed EF 20 to 25%, severe hypokinesis of the left ventricle, moderate to severe MR, moderate to severe TR, mild to moderately dilated right atrium, moderately dilated left atrium, restrictive filling pattern noted in the left ventricle, trivial pericardial effusion, RVSP 60 mmHg.  He was started on lasix, spiro, carvedilol, Farxiga, lisinopril, amlodipine and Imdur. Zio placed 10/22 showing minimal heart rate 78, maximal heart rate 120, average heart rate 92, patient remained tachycardic 8.5% of total time, 3 episodes of wide-complex tachycardia with longest run 20 beats. 13 patient triggered episodes all showed sinus rhythm with few PVCs and PACs noted, 1 episode of 10 beat run of wide-complex tachycardia.    He saw his cardiologist, Dr. Wille Glaser of [facility] cardiology, 10/22.  It was recommended he undergo outpatient Myoview, start LifeVest, then repeat echocardiogram in 3 months.  Unfortunately, insurance did not cover [facility] and he did not follow back up. He wore the LifeVest for about a week before he discontinued it.    Admitted 5/23 with left lower extremity pain. WBC 30.7, felt he was also slightly volume overloaded. Diuresed with IV lasix and placed on abx. LE dopplers negative for DVT.  Echo showed EF 20-25%, global hypokinesis with moderate LV dilation, mod-severe MR (severe by PISA ERO), moderate TR.  He underwent  RHC showing mildly elevated PCWP and normal RA pressure with mild pulmonary venous hypertension, CI 3.33 (off milrinone x 1 hour). No LHC with elevated SCr. Cardiac MRI showed >50% wall thickness subendocardial LGE in the mid anteroseptal wall,  looks like a coronary disease pattern suggestive of prior MI. LVEF 15%. GDMT limited by AKI. Plan for ischemic eval in future when creatinine comes down. Discharged home, weight 264 lbs.  He presented today for pharmD visit. During visit noted increased SOB w/ exertion, wt gain and LEE despite compliance w/ diuretics. Wt noted to be up ~20 lb since prior APP clinic visit on 6/1. ReDs Clip elevated 59%. Added on to APP schedule for acute care visit.    On exam he is resting comfortably w/o increased WOB. 2+ b/l LEE on exam. JVD elevated to jaw. No chest pain. BP 132/98. Admits to dietary indiscretion w/ sodium.  Labs (5/23): K 4.3, creatinine 2.43 Labs (6/23): K 4.9 , creatinine 2.57. GFR 33   Review of Systems: [y] = yes, [ ]  = no   General: Weight gain [Y]; Weight loss [ ] ; Anorexia [ ] ; Fatigue [ ] ; Fever [ ] ; Chills [ ] ; Weakness [ ]   Cardiac: Chest pain/pressure [ ] ; Resting SOB [ ] ; Exertional SOB [Y ]; Orthopnea [ ] ; Pedal Edema [ Y]; Palpitations [ ] ; Syncope [ ] ; Presyncope [ ] ; Paroxysmal nocturnal dyspnea[ ]   Pulmonary: Cough [ ] ; Wheezing[ ] ; Hemoptysis[ ] ; Sputum [ ] ; Snoring [place] ]  GI: Vomiting[ ] ; Dysphagia[ ] ; Melena[ ] ; Hematochezia [ ] ; Heartburn[ ] ; Abdominal pain [ ] ; Constipation [ ] ; Diarrhea [ ] ; BRBPR [ ]   GU: Hematuria[ ] ; Dysuria [ ] ; Nocturia[ ]   Vascular: Pain in legs with walking [ ] ; Pain in feet with lying flat [ ] ; Non-healing sores [place] ]; Stroke [ ] ; TIA [ ] ; Slurred speech [ ] ;  Neuro: Headaches[ ] ; Vertigo[ ] ; Seizures[ ] ; Paresthesias[ ] ;Blurred vision [ ] ; Diplopia [ ] ; Vision changes [ ]   Ortho/Skin: Arthritis [ ] ; Joint pain [ ] ; Muscle pain [ ] ; Joint swelling [ ] ; Back Pain [ ] ; Rash [ ]   Psych: Depression[ ] ; Anxiety[ ]   Heme: Bleeding problems [ ] ; Clotting disorders [ ] ; Anemia [ ]   Endocrine: Diabetes [place] ]; Thyroid dysfunction[ ]   Past Medical History:  Diagnosis Date   CHF (congestive heart failure) (HCC)    Diabetes mellitus without complication  (HCC)    diagnosed at age 78 due to obesity, started on insulin at that time    High cholesterol    Hypertension    Current Outpatient Medications  Medication Sig Dispense Refill   acetaminophen (TYLENOL) 325 MG tablet Take 2 tablets (650 mg total) by mouth every 6 (six) hours as needed for mild pain (or Fever >/= 101).     aspirin 81 MG EC tablet Take 1 tablet (81 mg total) by mouth daily. 30 tablet 0   atorvastatin (LIPITOR) 40 MG tablet Take 1 tablet (40 mg total) by mouth daily. 90 tablet 3   blood glucose meter kit and supplies Dispense based on patient and insurance preference. Use up to four times daily as directed. (FOR ICD-10 E10.9, E11.9). 1 each 0   carvedilol (COREG) 25 MG tablet Take 0.5 tablets (12.5 mg total) by mouth 2 (two) times daily. 180 tablet 3   glipiZIDE (GLUCOTROL) 5 MG tablet Take 1 tablet (5 mg total) by mouth daily before breakfast. 30 tablet 0   hydrALAZINE (APRESOLINE) 25 MG tablet Take 1 tablet (25 mg total) by mouth 3 (three) times daily. 260 tablet 3   Insulin Pen Needle 30G X 5 MM MISC New needle with every insulin adminsration 100 each 5   isosorbide mononitrate (IMDUR) 30 MG 24 hr tablet Take 1 tablet (30 mg total) by mouth daily. 90 tablet 3   torsemide (DEMADEX) 20 MG tablet Take 2 tablets (40 mg total) by mouth daily. 60 tablet 0   No current facility-administered medications for this encounter.   No Known Allergies  Social History   Socioeconomic History   Marital status: Single    Spouse name: Not on file   Number of children: Not on file   Years of education: Not on file   Highest education level: Not on file  Occupational History   Not on file  Tobacco Use   Smoking status: Never   Smokeless tobacco: Not on file  Vaping Use   Vaping Use: Never used  Substance and Sexual Activity   Alcohol use: No    Alcohol/week: 0.0 standard drinks of alcohol   Drug use: No   Sexual activity: Not on file  Other Topics Concern   Not on file   Social History Narrative   Not on file   Social Determinants of Health   Financial Resource Strain: Not on file  Food Insecurity: Not on file  Transportation Needs: Not on file  Physical Activity: Not on file  Stress: Not on file  Social Connections: Not on file  Intimate Partner Violence: Not on file   Family History  Problem Relation Age of Onset   Diabetes Mother    Hypertension Mother    Stroke Mother    Alcohol abuse Father    Cancer Maternal Grandmother    Diabetes Maternal Grandmother    Hypertension Maternal Grandmother    His mother also had heart failure in  her 60s and passed away from the heart failure.    BP (!) 132/98   Pulse 100   Wt 123.6 kg (272 lb 6.4 oz)   BMI 34.97 kg/m   Wt Readings from Last 3 Encounters:  04/20/22 123.6 kg (272 lb 6.4 oz)  03/25/22 113.7 kg (250 lb 9.6 oz)  03/08/22 120.2 kg (265 lb 1.6 oz)   PHYSICAL EXAM: ReDS 59%  General:  Well appearing. No respiratory difficulty HEENT: normal Neck: supple. JVD elevated to jaw. Carotids 2+ bilat; no bruits. No lymphadenopathy or thyromegaly appreciated. Cor: PMI nondisplaced. Regular rate & rhythm. No rubs, gallops or murmurs. Lungs: decreased BS at the bases bilaterally  Abdomen: soft, nontender, nondistended. No hepatosplenomegaly. No bruits or masses. Good bowel sounds. Extremities: no cyanosis, clubbing, rash, 2+ b/l pretibial edema Neuro: alert & oriented x 3, cranial nerves grossly intact. moves all 4 extremities w/o difficulty. Affect pleasant.   ASSESSMENT & PLAN: 1. Acute on Chronic systolic CHF: Cardiomyopathy of uncertain etiology.  Echo in 9/22 with EF 20-25%, moderate-severe MR, moderate-severe TR.  Echo this admission with EF 20-25%, global hypokinesis with moderate LV dilation, mod-severe MR (severe by PISA ERO), moderate TR.  He has had HTN since his teens, could be due to long-standing poorly controlled HTN. With DM/HTN/hyperlipidemia x years, CAD must be a consideration  though no chest pain. Prior viral myocarditis is possible as well. RHC this admit showed mildly elevated PCWP and normal RA pressure with mild pulmonary venous hypertension, CI 3.33 (off milrinone for about an hour).  cMRI limited study, difficult images due to problems with breath-holding. >50% wall thickness subendocardial LGE in the mid anteroseptal wall, looks like a coronary disease pattern suggestive of prior MI. LVEF 15%. Narrow QRS, not CRT candidate. Volume overloaded today w/ progression to NYHA Class III symptoms, likely due to dietary indiscretion.  - D/w Dr. Shirlee Latch. Will give 80 mg IV Lasix x 1 in clinic today + 20 mEq supp K  - Start Furoscix 80 mg daily x 2 days (hold torsemide) - Restart Jardiance 10 mg daily - Bring back on Friday 6/30 to reassess volume status and renal fx. May need admission for IV diuretics and or addition of once weekly metolazone.  - Hold torsemide for the next 2 days while using Furoscix - Continue carvedilol 12.5 mg bid. - Continue hydralazine 25 mg tid + Imdur 30 mg daily. - Eventual Entresto if creatinine comes down.  - Ideally, would have coronary angiography but need improved creatinine.  At some point, will need ischemic evaluation.  LHC if creatinine decreases to below 2 versus Cardiolite if it stays above 2.  - Check BMP today  2. CKD stage 3: Suspect baseline diabetic nephropathy.  - GFR has recently remained stable. Will restart Jardiance 10 mg daily  -  has been referred [employer].  - check BMP today  3. Type 2 diabetes: Early onset, on metformin at home. Last A1c 7.3 - Restart Jardiance 10 mg daily  4. HTN: Early onset in teens. Controlled on current meds. - GDMT per above  5. Hyperlipidemia: Continue atorvastatin.  6. Diabetic foot s/p toe amputation.  7. Mitral regurgitation: Possibly severe on echo. Appears at least mod on cMRI but regurgitant fraction 13% suggests mild MR. Suspect calculation is inaccurate given poor  images generally. Most likely functional.   - Will need eventual evaluation by TEE and consideration for Mitraclip if this does not improve with medical management.  8. LLE wound: Recent  cellulitis and treated with IV ceftriaxone and vanc. Refer to [facility], will be slow to heal with DM2. - Elevate leg and continue diuretics. 9. OSA: AHI 35. Previously followed by Neurology at [facility], but they are out of network with his insurance. Having trouble tolerating CPAP. - Refer to sleep medicine next visit.  F/u w/ APP in 2 days to reassess volume status.    Jeffery Lis, PA-C 04/20/22

## 2022-04-23 ENCOUNTER — Ambulatory Visit (HOSPITAL_COMMUNITY): Admission: RE | Admit: 2022-04-23 | Payer: 59 | Source: Ambulatory Visit

## 2022-04-23 ENCOUNTER — Ambulatory Visit (HOSPITAL_COMMUNITY)
Admission: RE | Admit: 2022-04-23 | Discharge: 2022-04-23 | Disposition: A | Discharge status: Home / Self Care | Payer: 59 | Source: Ambulatory Visit | Attending: Physician Assistant | Admitting: Physician Assistant

## 2022-04-23 ENCOUNTER — Encounter (HOSPITAL_COMMUNITY): Payer: Self-pay

## 2022-04-23 VITALS — BP 104/82 | HR 93 | Wt 264.4 lb

## 2022-04-23 DIAGNOSIS — I10 Essential (primary) hypertension: Secondary | ICD-10-CM

## 2022-04-23 DIAGNOSIS — Z7984 Long term (current) use of oral hypoglycemic drugs: Secondary | ICD-10-CM | POA: Insufficient documentation

## 2022-04-23 DIAGNOSIS — I5023 Acute on chronic systolic (congestive) heart failure: Secondary | ICD-10-CM

## 2022-04-23 DIAGNOSIS — I2510 Atherosclerotic heart disease of native coronary artery without angina pectoris: Secondary | ICD-10-CM | POA: Diagnosis not present

## 2022-04-23 DIAGNOSIS — I340 Nonrheumatic mitral (valve) insufficiency: Secondary | ICD-10-CM

## 2022-04-23 DIAGNOSIS — I130 Hypertensive heart and chronic kidney disease with heart failure and stage 1 through stage 4 chronic kidney disease, or unspecified chronic kidney disease: Secondary | ICD-10-CM | POA: Insufficient documentation

## 2022-04-23 DIAGNOSIS — N1832 Chronic kidney disease, stage 3b: Secondary | ICD-10-CM | POA: Diagnosis not present

## 2022-04-23 DIAGNOSIS — Z794 Long term (current) use of insulin: Secondary | ICD-10-CM | POA: Insufficient documentation

## 2022-04-23 DIAGNOSIS — I252 Old myocardial infarction: Secondary | ICD-10-CM | POA: Diagnosis not present

## 2022-04-23 DIAGNOSIS — G4733 Obstructive sleep apnea (adult) (pediatric): Secondary | ICD-10-CM | POA: Diagnosis not present

## 2022-04-23 DIAGNOSIS — E782 Mixed hyperlipidemia: Secondary | ICD-10-CM

## 2022-04-23 DIAGNOSIS — I081 Rheumatic disorders of both mitral and tricuspid valves: Secondary | ICD-10-CM | POA: Insufficient documentation

## 2022-04-23 DIAGNOSIS — Z79899 Other long term (current) drug therapy: Secondary | ICD-10-CM | POA: Diagnosis not present

## 2022-04-23 DIAGNOSIS — E1122 Type 2 diabetes mellitus with diabetic chronic kidney disease: Secondary | ICD-10-CM | POA: Diagnosis not present

## 2022-04-23 DIAGNOSIS — I272 Pulmonary hypertension, unspecified: Secondary | ICD-10-CM | POA: Diagnosis not present

## 2022-04-23 DIAGNOSIS — E785 Hyperlipidemia, unspecified: Secondary | ICD-10-CM | POA: Diagnosis not present

## 2022-04-23 LAB — BASIC METABOLIC PANEL
Anion gap: 6 (ref 5–15)
BUN: 55 mg/dL — ABNORMAL HIGH (ref 6–20)
CO2: 26 mmol/L (ref 22–32)
Calcium: 8.6 mg/dL — ABNORMAL LOW (ref 8.9–10.3)
Chloride: 108 mmol/L (ref 98–111)
Creatinine, Ser: 2.37 mg/dL — ABNORMAL HIGH (ref 0.61–1.24)
GFR, Estimated: 36 mL/min — ABNORMAL LOW (ref >60)
Glucose, Bld: 98 mg/dL (ref 70–99)
Potassium: 4.7 mmol/L (ref 3.5–5.1)
Sodium: 140 mmol/L (ref 135–145)

## 2022-04-23 LAB — BRAIN NATRIURETIC PEPTIDE: B Natriuretic Peptide: 2937.1 pg/mL — ABNORMAL HIGH (ref 0.0–100.0)

## 2022-04-23 MED ORDER — METOLAZONE 2.5 MG PO TABS: 2.5000 mg | ORAL_TABLET | ORAL | 3 refills | Status: DC

## 2022-04-23 MED ORDER — POTASSIUM CHLORIDE CRYS ER 20 MEQ PO TBCR: EXTENDED_RELEASE_TABLET | ORAL | 3 refills | Status: DC

## 2022-04-23 MED ORDER — TORSEMIDE 20 MG PO TABS: 40.0000 mg | ORAL_TABLET | Freq: Two times a day (BID) | ORAL | 3 refills | Status: DC

## 2022-04-23 NOTE — Patient Instructions (Addendum)
Labs done today. We will contact you only if your labs are abnormal.  INCREASE Torsemide to 40mg  (2 tablets) by mouth 2 times daily.   START Metoalzone 2.5mg  (1 tablet) by mouth weekly on Saturdays.   START Potassium 38meq(1 tablet) by mouth only when taking Metolazone.   No other medication changes were made. Please continue all current medications as prescribed.  Your physician recommends that you schedule a follow-up appointment in: 1 week for a lab only appointment and keep your pending appointments with 30m.   If you have any questions or concerns before your next appointment please send [place] a message through [place] or call our office at [phone].    TO LEAVE A MESSAGE FOR THE NURSE SELECT OPTION 2, PLEASE LEAVE A MESSAGE INCLUDING: YOUR NAME DATE OF BIRTH CALL BACK NUMBER REASON FOR CALL**this is important as we prioritize the call backs  YOU WILL RECEIVE A CALL BACK THE SAME DAY AS LONG AS YOU CALL BEFORE 4:00 PM   Do the following things EVERYDAY: Weigh yourself in the morning before breakfast. Write it down and keep it in a log. Take your medicines as prescribed Eat low salt foods--Limit salt (sodium) to 2000 mg per day.  Stay as active as you can everyday Limit all fluids for the day to less than 2 liters   At the [facility], you and your health needs are our priority. As part of our continuing mission to provide you with exceptional heart care, we have created designated Provider Care Teams. These Care Teams include your primary Cardiologist (physician) and Advanced Practice Providers (APPs- Physician Assistants and Nurse Practitioners) who all work together to provide you with the care you need, when you need it.   You may see any of the following providers on your designated Care Team at your next follow up: Dr [phone] Dr Arvilla Meres, NP Carron Curie, Robbie Lis [place], PharmD   Please be sure to bring in all your  medications bottles to every appointment. '

## 2022-04-23 NOTE — Progress Notes (Signed)
ADVANCED HF CLINIC CLINIC NOTE   PCP: N/A HF Cardiologist: Dr. Aundra Dubin  HPI: Jeffery Villa is a 36 y.o. male with past medical history of hypertension, hyperlipidemia, insulin-dependent diabetes mellitus since 36 years of age, CKD stage III, OSA and chronic systolic heart failure.    He was seen 6/22 by PCP, referred to cardiology for echo. Echo 9/22 showed EF 20 to 25%, moderate to severe MR, moderate to severe TR, RVSP 60 mmHg.  He was started on GDMT. Zio placed 10/22 showing average heart rate 92, patient remained tachycardic 8.5% of total time, 3 episodes of wide-complex tachycardia with longest run 20 beats.   He saw his cardiologist, Dr. Marina Goodell of [facility] cardiology, 10/22.  It was recommended he undergo outpatient Myoview, start LifeVest, then repeat echocardiogram in 3 months.  Unfortunately, insurance did not cover [facility] and he did not follow back up. He wore the LifeVest for about a week before he discontinued it.    Admitted 5/23 with left lower extremity pain. WBC 30.7, felt he was also slightly volume overloaded. Diuresed with IV lasix and placed on abx. LE dopplers negative for DVT.  Echo showed EF 20-25%, global hypokinesis with moderate LV dilation, mod-severe MR (severe by PISA ERO), moderate TR.  He underwent  RHC showing mildly elevated PCWP and normal RA pressure with mild pulmonary venous hypertension, CI 3.33 (off milrinone x 1 hour). No LHC with elevated SCr. Cardiac MRI showed >50% wall thickness subendocardial LGE in the mid anteroseptal wall, looks like a coronary disease pattern suggestive of prior MI. LVEF 15%. GDMT limited by AKI. Plan for ischemic eval in future when creatinine comes down. Discharged home, weight 264 lbs.  He presented for visit with PharmD for medication titration on 06/27. He was added on for urgent APP appointment. Wt noted to be up ~20 lb since prior APP clinic visit on 6/1. ReDs Clip elevated 59%. Admitted to dietary indiscretion w/  sodium. He was given 80 mg lasix IV and started on furoscix 80 mg daily X 2 days. Jardiance restarted.   He is here today for close follow-up. He restarted po Torsemide today. His home weight is down 10 lbs, 272 lb>>262lb. Dyspnea with exertion has improved, no longer having orthopnea. Still dealing with some lower extremity edema. He is wearing compression stockings. He has cut back sodium intake significantly. Struggling some with limiting fluids.   Review of Systems: Cardiac and Respiratory. Negative except as mentioned in HPI.  Past Medical History:  Diagnosis Date   CHF (congestive heart failure) (HCC)    Diabetes mellitus without complication ([place])    diagnosed at age 77 due to obesity, started on insulin at that time    High cholesterol    Hypertension    Current Outpatient Medications  Medication Sig Dispense Refill   acetaminophen (TYLENOL) 325 MG tablet Take 2 tablets (650 mg total) by mouth every 6 (six) hours as needed for mild pain (or Fever >/= 101).     aspirin 81 MG EC tablet Take 1 tablet (81 mg total) by mouth daily. 30 tablet 0   atorvastatin (LIPITOR) 40 MG tablet Take 1 tablet (40 mg total) by mouth daily. 90 tablet 3   blood glucose meter kit and supplies Dispense based on patient and insurance preference. Use up to four times daily as directed. (FOR ICD-10 E10.9, E11.9). 1 each 0   carvedilol (COREG) 25 MG tablet Take 0.5 tablets (12.5 mg total) by mouth 2 (two) times daily. [place]  tablet 3   dapagliflozin propanediol (FARXIGA) 10 MG TABS tablet Take 1 tablet (10 mg total) by mouth daily before breakfast. 30 tablet 3   glipiZIDE (GLUCOTROL) 5 MG tablet Take 1 tablet (5 mg total) by mouth daily before breakfast. 30 tablet 0   hydrALAZINE (APRESOLINE) 25 MG tablet Take 1 tablet (25 mg total) by mouth 3 (three) times daily. 260 tablet 3   Insulin Pen Needle 30G X 5 MM MISC New needle with every insulin adminsration 100 each 5   isosorbide mononitrate (IMDUR) 30 MG 24 hr  tablet Take 1 tablet (30 mg total) by mouth daily. 90 tablet 3   torsemide (DEMADEX) 20 MG tablet Take 2 tablets (40 mg total) by mouth daily. 60 tablet 0   No current facility-administered medications for this encounter.   No Known Allergies  Social History   Socioeconomic History   Marital status: Single    Spouse name: Not on file   Number of children: Not on file   Years of education: Not on file   Highest education level: Not on file  Occupational History   Not on file  Tobacco Use   Smoking status: Never   Smokeless tobacco: Not on file  Vaping Use   Vaping Use: Never used  Substance and Sexual Activity   Alcohol use: No    Alcohol/week: 0.0 standard drinks of alcohol   Drug use: No   Sexual activity: Not on file  Other Topics Concern   Not on file  Social History Narrative   Not on file   Social Determinants of Health   Financial Resource Strain: Not on file  Food Insecurity: Not on file  Transportation Needs: Not on file  Physical Activity: Not on file  Stress: Not on file  Social Connections: Not on file  Intimate Partner Violence: Not on file   Family History  Problem Relation Age of Onset   Diabetes Mother    Hypertension Mother    Stroke Mother    Alcohol abuse Father    Cancer Maternal Grandmother    Diabetes Maternal Grandmother    Hypertension Maternal Grandmother    His mother also had heart failure in her 33s and passed away from the heart failure.    BP 104/82   Pulse 93   Wt 119.9 kg (264 lb 6.4 oz)   SpO2 98%   BMI 33.95 kg/m   Wt Readings from Last 3 Encounters:  04/23/22 119.9 kg (264 lb 6.4 oz)  04/20/22 123.6 kg (272 lb 6.4 oz)  03/25/22 113.7 kg (250 lb 9.6 oz)   PHYSICAL EXAM: General:  Well appearing. No resp difficulty HEENT: normal Neck: supple. JVP 10-12 cm. Carotids 2+ bilat; no bruits.  Cor: PMI nondisplaced. Regular rate & rhythm. No rubs, gallops, + 3/6 MR murmur Lungs: diminished in bases Abdomen: soft,  nontender, nondistended.  Extremities: no cyanosis, clubbing, rash, 2+ edema b/l LE, TED hose on Neuro: alert & orientedx3, cranial nerves grossly intact. moves all 4 extremities w/o difficulty. Affect pleasant    ASSESSMENT & PLAN: 1. Acute on Chronic systolic CHF: Cardiomyopathy of uncertain etiology.  Echo in 9/22 with EF 20-25%, moderate-severe MR, moderate-severe TR.  Echo 05/23 EF 20-25%, global hypokinesis with moderate LV dilation, mod-severe MR (severe by PISA ERO), moderate TR.  He has had HTN since his teens, could be due to long-standing poorly controlled HTN. With DM/HTN/hyperlipidemia x years, CAD must be a consideration though no chest pain. Prior viral myocarditis is possible  as well. [place] 05/23: mildly elevated PCWP and normal RA pressure with mild pulmonary venous hypertension, CI 3.33 (off milrinone for about an hour).  cMRI limited study, difficult images due to problems with breath-holding. >50% wall thickness subendocardial LGE in the mid anteroseptal wall, looks like a coronary disease pattern suggestive of prior MI. LVEF 15%. Narrow QRS, not CRT candidate.  - Recent volume overload and NYHA III symptoms in setting of dietary indiscretion - Volume improving after furoscix but still overloaded. ReDS 58% today. Increase Torsemide to 40 BID and add 2.5 mg metolazone once weekly on Saturdays. Take 20 mEq potassium chloride with metolazone. - Continue Jardiance 10 mg daily - Continue carvedilol 12.5 mg bid. - Continue hydralazine 25 mg tid + Imdur 30 mg daily. - Eventual Entresto if creatinine comes down.  - Ideally, would have coronary angiography but need improved creatinine.  At some point, will need ischemic evaluation.  LHC if creatinine decreases to below 2 versus Cardiolite if it stays above 2.  - Scr up slightly from a few days ago, 1.90>2.37, but suspect lower Scr was d/t dilution in setting of volume overload. BNP remains close to 3,000. - BMET early next week 2. 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  - Recent Scr 2-2.4, up to 3 during recent admit - Has been referred [employer] [employer].  - Continue Jardiance 10 mg daily  3. Type 2 diabetes: Early onset. Last A1c 7.3 - Continue Jardiance 10 mg daily  4. HTN: Early onset in teens. Controlled on current meds. - GDMT per above  5. Hyperlipidemia: Continue atorvastatin.  6. Diabetic foot s/p toe amputation.  7. Mitral regurgitation: Possibly severe on echo. Appears at least mod on cMRI but regurgitant fraction 13% suggests mild MR. Suspect calculation is inaccurate given poor images generally. Most likely functional.   - Will need eventual evaluation by TEE and consideration for Mitraclip if this does not improve with medical management.  8. LLE wound: Recent cellulitis and treated with IV ceftriaxone and vanc. Followed by [place] 9. OSA: AHI 35. Previously followed by Neurology at [facility], but they are out of network with his insurance. Having trouble tolerating CPAP. - Refer to sleep medicine at next f/u.  F/u: BMET next week. F/u as scheduled 07/10 with APP, he will call before then with any worsening symptoms   Dayan Desa N, PA-C 04/23/22

## 2022-04-23 NOTE — Progress Notes (Signed)
ReDS Vest / Clip - 04/23/22 1300       ReDS Vest / Clip   Station Marker C    Ruler Value 27    ReDS Value Range High volume overload    ReDS Actual Value 58    Anatomical Comments sitting

## 2022-04-28 ENCOUNTER — Encounter (HOSPITAL_BASED_OUTPATIENT_CLINIC_OR_DEPARTMENT_OTHER): Payer: 59 | Attending: General Surgery | Admitting: General Surgery

## 2022-04-28 ENCOUNTER — Ambulatory Visit (HOSPITAL_COMMUNITY)
Admission: RE | Admit: 2022-04-28 | Discharge: 2022-04-28 | Disposition: A | Discharge status: Home / Self Care | Payer: 59 | Source: Ambulatory Visit | Attending: Cardiology | Admitting: Cardiology

## 2022-04-28 DIAGNOSIS — I5023 Acute on chronic systolic (congestive) heart failure: Secondary | ICD-10-CM | POA: Insufficient documentation

## 2022-04-28 DIAGNOSIS — L97221 Non-pressure chronic ulcer of left calf limited to breakdown of skin: Secondary | ICD-10-CM | POA: Insufficient documentation

## 2022-04-28 DIAGNOSIS — E11622 Type 2 diabetes mellitus with other skin ulcer: Secondary | ICD-10-CM | POA: Insufficient documentation

## 2022-04-28 DIAGNOSIS — E1122 Type 2 diabetes mellitus with diabetic chronic kidney disease: Secondary | ICD-10-CM | POA: Diagnosis not present

## 2022-04-28 DIAGNOSIS — E1142 Type 2 diabetes mellitus with diabetic polyneuropathy: Secondary | ICD-10-CM | POA: Diagnosis not present

## 2022-04-28 DIAGNOSIS — N183 Chronic kidney disease, stage 3 unspecified: Secondary | ICD-10-CM | POA: Diagnosis not present

## 2022-04-28 DIAGNOSIS — E1151 Type 2 diabetes mellitus with diabetic peripheral angiopathy without gangrene: Secondary | ICD-10-CM | POA: Insufficient documentation

## 2022-04-28 DIAGNOSIS — I890 Lymphedema, not elsewhere classified: Secondary | ICD-10-CM | POA: Insufficient documentation

## 2022-04-28 DIAGNOSIS — I5022 Chronic systolic (congestive) heart failure: Secondary | ICD-10-CM | POA: Insufficient documentation

## 2022-04-28 DIAGNOSIS — I130 Hypertensive heart and chronic kidney disease with heart failure and stage 1 through stage 4 chronic kidney disease, or unspecified chronic kidney disease: Secondary | ICD-10-CM | POA: Diagnosis not present

## 2022-04-28 LAB — BASIC METABOLIC PANEL
Anion gap: 9 (ref 5–15)
BUN: 58 mg/dL — ABNORMAL HIGH (ref 6–20)
CO2: 28 mmol/L (ref 22–32)
Calcium: 8.9 mg/dL (ref 8.9–10.3)
Chloride: 102 mmol/L (ref 98–111)
Creatinine, Ser: 2.44 mg/dL — ABNORMAL HIGH (ref 0.61–1.24)
GFR, Estimated: 35 mL/min — ABNORMAL LOW (ref >60)
Glucose, Bld: 164 mg/dL — ABNORMAL HIGH (ref 70–99)
Potassium: 5 mmol/L (ref 3.5–5.1)
Sodium: 139 mmol/L (ref 135–145)

## 2022-04-28 NOTE — Progress Notes (Signed)
Jeffery Villa, Jeffery Villa ([id number]) Visit Report for 04/28/2022 Chief Complaint Document Details Patient Name: Date of Service: Jeffery Villa, Jeffery Villa 04/28/2022 2:45 PM Medical Record Number: [id number] Patient Account Number: [id number] Date of Birth/Sex: Treating RN: 1986/08/09 (36 y.o. Jeffery Villa Primary Care Provider: PA Jeffery Villa, [place] Other Clinician: Referring Provider: Treating Provider/Extender: Jeffery Villa in Treatment: 3 Information Obtained from: Patient Chief Complaint Patient seen for complaints of Non-Healing Wound. Electronic Signature(s) Signed: 04/28/2022 3:47:16 PM By: Duanne Guess MD FACS Entered By: Duanne Guess on 04/28/2022 15:47:16 -------------------------------------------------------------------------------- Debridement Details Patient Name: Date of Service: Jeffery Villa 04/28/2022 2:45 PM Medical Record Number: [id number] Patient Account Number: [id number] Date of Birth/Sex: Treating RN: 08-16-1986 (36 y.o. Jeffery Villa Primary Care Provider: PA Jeffery Villa, [place] Other Clinician: Referring Provider: Treating Provider/Extender: Jeffery Villa in Treatment: 3 Debridement Performed for Assessment: Wound #1 Left,Lateral Lower Leg Performed By: Physician Duanne Guess, MD Debridement Type: Debridement Severity of Tissue Pre Debridement: Fat layer exposed Level of Consciousness (Pre-procedure): Awake and Alert Pre-procedure Verification/Time Out Yes - 15:45 Taken: Start Time: 15:45 Pain Control: Lidocaine 4% T opical Solution T Area Debrided (L x W): otal 1 (cm) x 0.8 (cm) = 0.8 (cm) Tissue and other material debrided: Viable, Non-Viable, Slough, Subcutaneous, Slough Level: Skin/Subcutaneous Tissue Debridement Description: Excisional Instrument: Curette Bleeding: Minimum Hemostasis Achieved: Pressure Procedural Pain: 0 Post Procedural Pain: 0 Response to Treatment: Procedure was  tolerated well Level of Consciousness (Post- Awake and Alert procedure): Post Debridement Measurements of Total Wound Length: (cm) 1 Width: (cm) 0.8 Depth: (cm) 0.1 Volume: (cm) 0.063 Character of Wound/Ulcer Post Debridement: Improved Severity of Tissue Post Debridement: Fat layer exposed Post Procedure Diagnosis Same as Pre-procedure Electronic Signature(s) Signed: 04/28/2022 4:15:22 PM By: Duanne Guess MD FACS Signed: 04/28/2022 5:32:47 PM By: Jeffery Deed RN, BSN Entered By: Jeffery Villa on 04/28/2022 15:45:57 -------------------------------------------------------------------------------- HPI Details Patient Name: Date of Service: Jeffery Villa 04/28/2022 2:45 PM Medical Record Number: [id number] Patient Account Number: [id number] Date of Birth/Sex: Treating RN: Mar 08, 1986 (36 y.o. Jeffery Villa Primary Care Provider: PA TIENT, [place] Other Clinician: Referring Provider: Treating Provider/Extender: Jeffery Villa in Treatment: 3 History of Present Illness HPI Description: ADMISSION 04/06/2022 This is a 36 year old man with type 2 diabetes mellitus (last A1c 7.3% on Mar 06, 2022), severe congestive heart failure with an ejection fraction of around 30%, diabetic nephropathy, hypertension, and recent hospital admission for sepsis due to cellulitis and fluid overload. He was discharged from the hospital and then subsequently developed a blister on his left lateral calf. This opened and as result he was referred to wound care. He has just been keeping a dry dressing on the site. It is fairly small and has a thin layer of dried eschar over the surface. There is no odor or drainage. He does have 2+ nonpitting edema to the bilateral lower extremities. He says that he has worn compression stockings in the past, but they are 15-20 mmHg pressure and are over 6 months old. 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 ABI in  clinic today was noncompressible but he has palpable pedal pulses. 04/13/2022: The large area that was previously a blister has epithelialized. It is still very thin and friable skin, however. The slightly distal wound has some slough accumulation. The patient says that he has been very itchy since his last visit. 04/20/2022: We used triamcinolone last week which  has helped with his itching. The distal wound has some slough accumulation, but is a bit smaller than last week. Edema control is good. 04/28/2022: The wound continues to contract. The periwound skin is in better condition this week. Triamcinolone continues to aid in the itching. No concern for infection. Electronic Signature(s) Signed: 04/28/2022 3:48:04 PM By: Duanne Guess MD FACS Entered By: Duanne Guess on 04/28/2022 15:48:04 -------------------------------------------------------------------------------- Physical Exam Details Patient Name: Date of Service: Jeffery Villa 04/28/2022 2:45 PM Medical Record Number: [id number] Patient Account Number: [id number] Date of Birth/Sex: Treating RN: March 26, 1986 (36 y.o. Jeffery Villa Primary Care Provider: PA Jeffery Villa, [place] Other Clinician: Referring Provider: Treating Provider/Extender: Jeffery Villa in Treatment: 3 Constitutional . . . . No acute distress.Marland Kitchen Respiratory Normal work of breathing on room air.. Notes 04/28/2022: The wound continues to contract. The periwound skin is in better condition this week. No concern for infection. Electronic Signature(s) Signed: 04/28/2022 3:49:31 PM By: Duanne Guess MD FACS Entered By: Duanne Guess on 04/28/2022 15:49:31 -------------------------------------------------------------------------------- Physician Orders Details Patient Name: Date of Service: Jeffery Villa 04/28/2022 2:45 PM Medical Record Number: [id number] Patient Account Number: [id number] Date of Birth/Sex: Treating RN: [birth year]  (36 y.o. Jeffery Villa Primary Care Provider: PA Jeffery Villa, [place] Other Clinician: Referring Provider: Treating Provider/Extender: Jeffery Villa in Treatment: 3 Verbal / Phone Orders: No Diagnosis Coding ICD-10 Coding Code Description [phone] Non-pressure chronic ulcer of left calf limited to breakdown of skin I50.22 Chronic systolic (congestive) heart failure E11.65 Type 2 diabetes mellitus with hyperglycemia E11.42 Type 2 diabetes mellitus with diabetic polyneuropathy L03.90 Cellulitis, unspecified E11.59 Type 2 diabetes mellitus with other circulatory complications Follow-up Appointments ppointment in 1 week. - Dr. Lady Gary - Room 1 Return A Tuesday 7/11 @ 1:15 pm Bathing/ Shower/ Hygiene May shower with protection but do not get wound dressing(s) wet. - Ok to use Arts development officer, can buy at [facility], [facility], or [employer] Edema Control - Lymphedema / SCD / Other Elevate legs to the level of the heart or above for 30 minutes daily and/or when sitting, a frequency of: - throughout the day Avoid standing for long periods of time. Exercise regularly Compression stocking or Garment 20-30 mm/Hg pressure to: - right leg daily Wound Treatment Wound #1 - Lower Leg Wound Laterality: Left, Lateral Cleanser: Soap and Water 1 x Per Week Discharge Instructions: May shower and wash wound with dial antibacterial soap and water prior to dressing change. Cleanser: Wound Cleanser 1 x Per Week Discharge Instructions: Cleanse the wound with wound cleanser prior to applying a clean dressing using gauze sponges, not tissue or cotton balls. Peri-Wound Care: Triamcinolone 15 (g) 1 x Per Week Discharge Instructions: Use triamcinolone 15 (g) as directed Peri-Wound Care: Sween Lotion (Moisturizing lotion) 1 x Per Week Discharge Instructions: Apply moisturizing lotion as directed Prim Dressing: KerraCel Ag Gelling Fiber Dressing, 2x2 in (silver alginate) 1 x Per Week ary Discharge  Instructions: Apply silver alginate to wound bed as instructed Secondary Dressing: Woven Gauze Sponge, Non-Sterile 4x4 in 1 x Per Week Discharge Instructions: Apply over primary dressing as directed. Compression Wrap: ThreePress (3 layer compression wrap) 1 x Per Week Discharge Instructions: Apply three layer compression as directed. Patient Medications llergies: No Known Drug Allergies A Notifications Medication Indication Start End prior to debridement 04/28/2022 lidocaine DOSE topical 4 % cream - cream topical Electronic Signature(s) Signed: 04/28/2022 4:15:22 PM By: Duanne Guess MD FACS Entered By: Duanne Guess on 04/28/2022 15:49:40 -------------------------------------------------------------------------------- Problem List  Details Patient Name: Date of Service: Jeffery Villa, Jeffery Villa 04/28/2022 2:45 PM Medical Record Number: [id number] Patient Account Number: [id number] Date of Birth/Sex: Treating RN: [birth year] (36 y.o. [name] Primary Care Provider: PA Haig Prophet, [place] Other Clinician: Referring Provider: Treating Provider/Extender: Lenell Antu in Treatment: 3 Active Problems ICD-10 Encounter Code Description Active Date MDM Diagnosis L97.221 Non-pressure chronic ulcer of left calf limited to breakdown of skin 04/06/2022 No Yes [id number] Chronic systolic (congestive) heart failure 04/06/2022 No Yes E11.65 Type 2 diabetes mellitus with hyperglycemia 04/06/2022 No Yes E11.42 Type 2 diabetes mellitus with diabetic polyneuropathy 04/06/2022 No Yes L03.90 Cellulitis, unspecified 04/06/2022 No Yes E11.59 Type 2 diabetes mellitus with other circulatory complications 0000000 No Yes Inactive Problems Resolved Problems Electronic Signature(s) Signed: 04/28/2022 3:46:47 PM By: Fredirick Maudlin MD FACS Entered By: Fredirick Maudlin on 04/28/2022 15:46:46 -------------------------------------------------------------------------------- Progress Note  Details Patient Name: Date of Service: Jeffery Villa 04/28/2022 2:45 PM Medical Record Number: [id number] Patient Account Number: [id number] Date of Birth/Sex: Treating RN: 1986-03-02 (36 y.o. [name] Primary Care Provider: PA Haig Prophet, [place] Other Clinician: Referring Provider: Treating Provider/Extender: Lenell Antu in Treatment: 3 Subjective Chief Complaint Information obtained from Patient Patient seen for complaints of Non-Healing Wound. History of Present Illness (HPI) ADMISSION 04/06/2022 This is a 36 year old man with type 2 diabetes mellitus (last A1c 7.3% on Mar 06, 2022), severe congestive heart failure with an ejection fraction of around 30%, diabetic nephropathy, hypertension, and recent hospital admission for sepsis due to cellulitis and fluid overload. He was discharged from the hospital and then subsequently developed a blister on his left lateral calf. This opened and as result he was referred to wound care. He has just been keeping a dry dressing on the site. It is fairly small and has a thin layer of dried eschar over the surface. There is no odor or drainage. He does have 2+ nonpitting edema to the bilateral lower extremities. He says that he has worn compression stockings in the past, but they are 15-20 mmHg pressure and are over 6 months old. 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 ABI in clinic today was noncompressible but he has palpable pedal pulses. 04/13/2022: The large area that was previously a blister has epithelialized. It is still very thin and friable skin, however. The slightly distal wound has some slough accumulation. The patient says that he has been very itchy since his last visit. 04/20/2022: We used triamcinolone last week which has helped with his itching. The distal wound has some slough accumulation, but is a bit smaller than last week. Edema control is good. 04/28/2022: The wound  continues to contract. The periwound skin is in better condition this week. Triamcinolone continues to aid in the itching. No concern for infection. Patient History Information obtained from Patient. Family History Cancer - Maternal Grandparents, Diabetes - Mother,Maternal Grandparents, Hypertension - Mother, Stroke - Mother. Social History Never smoker, Marital Status - Married, Alcohol Use - Never, Drug Use - No History, Caffeine Use - Rarely. Medical History Cardiovascular Patient has history of Congestive Heart Failure, Hypertension Endocrine Patient has history of Type II Diabetes Neurologic Patient has history of Neuropathy Medical A Surgical History Notes nd Cardiovascular Mitral and tricuspid valve regurgitation Genitourinary CKD-3 Objective Constitutional No acute distress.. Vitals Time Taken: 3:29 PM, Height: 74 in, Weight: 250 lbs, BMI: 32.1, Temperature: 97.8 F, Pulse: 99 bpm, Respiratory Rate: 18 breaths/min, Blood Pressure:  124/89 mmHg, Capillary Blood Glucose: 153 mg/dl. General Notes: glucose per pt report this am Respiratory Normal work of breathing on room air.. General Notes: 04/28/2022: The wound continues to contract. The periwound skin is in better condition this week. No concern for infection. Integumentary (Hair, Skin) Wound #1 status is Open. Original cause of wound was Blister. The date acquired was: 03/09/2022. The wound has been in treatment 3 weeks. The wound is located on the Left,Lateral Lower Leg. The wound measures 1cm length x 0.8cm width x 0.1cm depth; 0.628cm^2 area and 0.063cm^3 volume. There is Fat Layer (Subcutaneous Tissue) exposed. There is no tunneling or undermining noted. There is a medium amount of purulent drainage noted. The wound margin is distinct with the outline attached to the wound base. There is medium (34-66%) pink granulation within the wound bed. There is a medium (34-66%) amount of necrotic tissue within the wound bed  including Adherent Slough. Assessment Active Problems ICD-10 Non-pressure chronic ulcer of left calf limited to breakdown of skin Chronic systolic (congestive) heart failure Type 2 diabetes mellitus with hyperglycemia Type 2 diabetes mellitus with diabetic polyneuropathy Cellulitis, unspecified Type 2 diabetes mellitus with other circulatory complications Procedures Wound #1 Pre-procedure diagnosis of Wound #1 is a Venous Leg Ulcer located on the Left,Lateral Lower Leg .Severity of Tissue Pre Debridement is: Fat layer exposed. There was a Excisional Skin/Subcutaneous Tissue Debridement with a total area of 0.8 sq cm performed by Fredirick Maudlin, MD. With the following instrument(s): Curette to remove Viable and Non-Viable tissue/material. Material removed includes Subcutaneous Tissue and Slough and after achieving pain control using Lidocaine 4% T opical Solution. No specimens were taken. A time out was conducted at 15:45, prior to the start of the procedure. A Minimum amount of bleeding was controlled with Pressure. The procedure was tolerated well with a pain level of 0 throughout and a pain level of 0 following the procedure. Post Debridement Measurements: 1cm length x 0.8cm width x 0.1cm depth; 0.063cm^3 volume. Character of Wound/Ulcer Post Debridement is improved. Severity of Tissue Post Debridement is: Fat layer exposed. Post procedure Diagnosis Wound #1: Same as Pre-Procedure Pre-procedure diagnosis of Wound #1 is a Venous Leg Ulcer located on the Left,Lateral Lower Leg . There was a Three Layer Compression Therapy Procedure by Baruch Gouty, RN. Post procedure Diagnosis Wound #1: Same as Pre-Procedure Plan Follow-up Appointments: Return Appointment in 1 week. - Dr. Celine Ahr - Room 1 Tuesday 7/11 @ 1:15 pm Bathing/ Shower/ Hygiene: May shower with protection but do not get wound dressing(s) wet. - Ok to use Market researcher, can buy at [facility], [facility], or [employer] Edema Control -  Lymphedema / SCD / Other: Elevate legs to the level of the heart or above for 30 minutes daily and/or when sitting, a frequency of: - throughout the day Avoid standing for long periods of time. Exercise regularly Compression stocking or Garment 20-30 mm/Hg pressure to: - right leg daily The following medication(s) was prescribed: lidocaine topical 4 % cream cream topical for prior to debridement was prescribed at facility WOUND #1: - Lower Leg Wound Laterality: Left, Lateral Cleanser: Soap and Water 1 x Per Week/ Discharge Instructions: May shower and wash wound with dial antibacterial soap and water prior to dressing change. Cleanser: Wound Cleanser 1 x Per Week/ Discharge Instructions: Cleanse the wound with wound cleanser prior to applying a clean dressing using gauze sponges, not tissue or cotton balls. Peri-Wound Care: Triamcinolone 15 (g) 1 x Per Week/ Discharge Instructions: Use triamcinolone 15 (g) as  directed Peri-Wound Care: Sween Lotion (Moisturizing lotion) 1 x Per Week/ Discharge Instructions: Apply moisturizing lotion as directed Prim Dressing: KerraCel Ag Gelling Fiber Dressing, 2x2 in (silver alginate) 1 x Per Week/ ary Discharge Instructions: Apply silver alginate to wound bed as instructed Secondary Dressing: Woven Gauze Sponge, Non-Sterile 4x4 in 1 x Per Week/ Discharge Instructions: Apply over primary dressing as directed. Com pression Wrap: ThreePress (3 layer compression wrap) 1 x Per Week/ Discharge Instructions: Apply three layer compression as directed. 04/28/2022: The wound continues to contract. The periwound skin is in better condition this week. Triamcinolone continues to aid in the itching. No concern for infection. I used a curette to debride slough and nonviable subcutaneous tissue from the wound. We will continue using silver alginate with periwound triamcinolone and 3 layer compression. Follow-up in 1 week. Electronic Signature(s) Signed: 04/28/2022 3:50:41 PM  By: Fredirick Maudlin MD FACS Entered By: Fredirick Maudlin on 04/28/2022 15:50:40 -------------------------------------------------------------------------------- HxROS Details Patient Name: Date of Service: Jeffery Villa 04/28/2022 2:45 PM Medical Record Number: [id number] Patient Account Number: [id number] Date of Birth/Sex: Treating RN: 1986/05/20 (36 y.o. [name] Primary Care Provider: PA Haig Prophet, [place] Other Clinician: Referring Provider: Treating Provider/Extender: Lenell Antu in Treatment: 3 Information Obtained From Patient Cardiovascular Medical History: Positive for: Congestive Heart Failure; Hypertension Past Medical History Notes: Mitral and tricuspid valve regurgitation Endocrine Medical History: Positive for: Type II Diabetes Treated with: Oral agents Blood sugar tested every day: Yes Tested : Genitourinary Medical History: Past Medical History Notes: CKD-3 Neurologic Medical History: Positive for: Neuropathy Immunizations Pneumococcal Vaccine: Received Pneumococcal Vaccination: No Implantable Devices None Family and Social History Cancer: Yes - Maternal Grandparents; Diabetes: Yes - Mother,Maternal Grandparents; Hypertension: Yes - Mother; Stroke: Yes - Mother; Never smoker; Marital Status - Married; Alcohol Use: Never; Drug Use: No History; Caffeine Use: Rarely; Financial Concerns: No; Food, Clothing or Shelter Needs: No; Support System Lacking: No; Transportation Concerns: No Engineer, maintenance) Signed: 04/28/2022 4:15:22 PM By: Fredirick Maudlin MD FACS Signed: 04/28/2022 5:32:47 PM By: Baruch Gouty RN, BSN Entered By: Fredirick Maudlin on 04/28/2022 15:49:00 -------------------------------------------------------------------------------- [place] Details Patient Name: Date of Service: Jeffery Villa 04/28/2022 Medical Record Number: [id number] Patient Account Number: [id number] Date of  Birth/Sex: Treating RN: 02/09/1986 (36 y.o. [name] Primary Care Provider: PA Haig Prophet, [place] Other Clinician: Referring Provider: Treating Provider/Extender: Lenell Antu in Treatment: 3 Diagnosis Coding ICD-10 Codes Code Description [phone] Non-pressure chronic ulcer of left calf limited to breakdown of skin [id number] Chronic systolic (congestive) heart failure E11.65 Type 2 diabetes mellitus with hyperglycemia E11.42 Type 2 diabetes mellitus with diabetic polyneuropathy L03.90 Cellulitis, unspecified E11.59 Type 2 diabetes mellitus with other circulatory complications Facility Procedures CPT4 Code: [id number] Description: [id number] - DEB SUBQ TISSUE 20 SQ CM/< ICD-10 Diagnosis Description L97.221 Non-pressure chronic ulcer of left calf limited to breakdown of skin Modifier: Quantity: 1 Physician Procedures : CPT4 Code Description Modifier [id number] - WC PHYS LEVEL 3 - EST PT 25 ICD-10 Diagnosis Description L97.221 Non-pressure chronic ulcer of left calf limited to breakdown of skin [id number] Chronic systolic (congestive) heart failure E11.59 Type 2  diabetes mellitus with other circulatory complications [id number] Type 2 diabetes mellitus with diabetic polyneuropathy Quantity: 1 : [id number] - WC PHYS SUBQ TISS 20 SQ CM ICD-10 Diagnosis Description L97.221 Non-pressure chronic ulcer of left calf limited to breakdown of skin Quantity: 1 Electronic Signature(s) Signed: 04/28/2022 3:51:04 PM By: Fredirick Maudlin MD FACS Entered By: Fredirick Maudlin on  04/28/2022 15:51:04 

## 2022-04-28 NOTE — Progress Notes (Signed)
CAMBRIDGE, DELEO ([id number]) Visit Report for 04/28/2022 Arrival Information Details Patient Name: Date of Service: Jeffery Villa, Jeffery Villa 04/28/2022 2:45 PM Medical Record Number: [id number] Patient Account Number: [id number] Date of Birth/Sex: Treating RN: 20-Jun-1986 (36 y.o. Jeffery Villa, Bonita Quin Primary Care Alajia Schmelzer: PA Zenovia Jordan, [place] Other Clinician: Referring Flay Ghosh: Treating Lynette Noah/Extender: Elveria Rising in Treatment: 3 Visit Information History Since Last Visit Added or deleted any medications: Yes Patient Arrived: Ambulatory Any new allergies or adverse reactions: No Arrival Time: 15:28 Had a fall or experienced change in No Accompanied By: self activities of daily living that may affect Transfer Assistance: None risk of falls: Patient Identification Verified: Yes Signs or symptoms of abuse/neglect since last visito No Secondary Verification Process Completed: Yes Hospitalized since last visit: No Patient Requires Transmission-Based Precautions: No Implantable device outside of the clinic excluding No Patient Has Alerts: Yes cellular tissue based products placed in the center Patient Alerts: L ABI non compressible since last visit: Has Dressing in Place as Prescribed: Yes Has Compression in Place as Prescribed: Yes Pain Present Now: No Electronic Signature(s) Signed: 04/28/2022 5:32:47 PM By: Zenaida Deed RN, BSN Entered By: Zenaida Deed on 04/28/2022 15:29:16 -------------------------------------------------------------------------------- Compression Therapy Details Patient Name: Date of Service: Jeffery Villa 04/28/2022 2:45 PM Medical Record Number: [id number] Patient Account Number: [id number] Date of Birth/Sex: Treating RN: [birth year] (36 y.o. Jeffery Villa Primary Care Christmas Faraci: PA Zenovia Jordan, [place] Other Clinician: Referring Tianah Lonardo: Treating Atzel Mccambridge/Extender: Elveria Rising in Treatment:  3 Compression Therapy Performed for Wound Assessment: Wound #1 Left,Lateral Lower Leg Performed By: Clinician Zenaida Deed, RN Compression Type: Three Layer Post Procedure Diagnosis Same as Pre-procedure Electronic Signature(s) Signed: 04/28/2022 5:32:47 PM By: Zenaida Deed RN, BSN Entered By: Zenaida Deed on 04/28/2022 15:46:12 -------------------------------------------------------------------------------- Encounter Discharge Information Details Patient Name: Date of Service: Jeffery Villa 04/28/2022 2:45 PM Medical Record Number: [id number] Patient Account Number: [id number] Date of Birth/Sex: Treating RN: [birth year] (36 y.o. Jeffery Villa Primary Care Jomel Whittlesey: PA Zenovia Jordan, [place] Other Clinician: Referring Vinay Ertl: Treating Korynn Kenedy/Extender: Elveria Rising in Treatment: 3 Encounter Discharge Information Items Post Procedure Vitals Discharge Condition: Stable Temperature (F): 97.8 Ambulatory Status: Ambulatory Pulse (bpm): 99 Discharge Destination: Home Respiratory Rate (breaths/min): 18 Transportation: Private Auto Blood Pressure (mmHg): 124/89 Accompanied By: self Schedule Follow-up Appointment: Yes Clinical Summary of Care: Patient Declined Electronic Signature(s) Signed: 04/28/2022 5:32:47 PM By: Zenaida Deed RN, BSN Entered By: Zenaida Deed on 04/28/2022 16:00:45 -------------------------------------------------------------------------------- Lower Extremity Assessment Details Patient Name: Date of Service: Jeffery Villa 04/28/2022 2:45 PM Medical Record Number: [id number] Patient Account Number: [id number] Date of Birth/Sex: Treating RN: May 30, 1986 (36 y.o. Jeffery Villa Primary Care Kamren Heintzelman: PA Zenovia Jordan, [place] Other Clinician: Referring Amara Justen: Treating Ashland Wiseman/Extender: Elveria Rising in Treatment: 3 Edema Assessment Assessed: [Left: No] [Right: No] Edema: [Left: Ye] [Right:  s] Calf Left: Right: Point of Measurement: 35 cm From Medial Instep 39.5 cm Ankle Left: Right: Point of Measurement: 9 cm From Medial Instep 26 cm Vascular Assessment Pulses: Dorsalis Pedis Palpable: [Left:Yes] Electronic Signature(s) Signed: 04/28/2022 5:32:47 PM By: Zenaida Deed RN, BSN Entered By: Zenaida Deed on 04/28/2022 15:34:00 -------------------------------------------------------------------------------- Multi Wound Chart Details Patient Name: Date of Service: Jeffery Villa 04/28/2022 2:45 PM Medical Record Number: [id number] Patient Account Number: [id number] Date of Birth/Sex: Treating RN: 08-15-1986 (36 y.o. Jeffery Villa Primary Care Zauria Dombek: PA Zenovia Jordan, [place] Other Clinician: Referring Eliette Drumwright: Treating Deshon Koslowski/Extender: Elveria Rising in Treatment:  3 Vital Signs Height(in): 74 Capillary Blood Glucose(mg/dl): 153 Weight(lbs): 250 Pulse(bpm): 99 Body Mass Index(BMI): 32.1 Blood Pressure(mmHg): 124/89 Temperature(F): 97.8 Respiratory Rate(breaths/min): 18 Photos: [N/A:N/A] Left, Lateral Lower Leg N/A N/A Wound Location: Blister N/A N/A Wounding Event: Venous Leg Ulcer N/A N/A Primary Etiology: Congestive Heart Failure, N/A N/A Comorbid History: Hypertension, Type II Diabetes, Neuropathy 03/09/2022 N/A N/A Date Acquired: 3 N/A N/A Weeks of Treatment: Open N/A N/A Wound Status: No N/A N/A Wound Recurrence: 1x0.8x0.1 N/A N/A Measurements L x W x D (cm) 0.628 N/A N/A A (cm) : rea 0.063 N/A N/A Volume (cm) : 33.30% N/A N/A % Reduction in A rea: 33.00% N/A N/A % Reduction in Volume: Full Thickness Without Exposed N/A N/A Classification: Support Structures Medium N/A N/A Exudate A mount: Purulent N/A N/A Exudate Type: yellow, brown, green N/A N/A Exudate Color: Distinct, outline attached N/A N/A Wound Margin: Medium (34-66%) N/A N/A Granulation A mount: Pink N/A N/A Granulation  Quality: Medium (34-66%) N/A N/A Necrotic A mount: Fat Layer (Subcutaneous Tissue): Yes N/A N/A Exposed Structures: Fascia: No Tendon: No Muscle: No Joint: No Bone: No Small (1-33%) N/A N/A Epithelialization: Debridement - Excisional N/A N/A Debridement: Pre-procedure Verification/Time Out 15:45 N/A N/A Taken: Lidocaine 4% Topical Solution N/A N/A Pain Control: Subcutaneous, Slough N/A N/A Tissue Debrided: Skin/Subcutaneous Tissue N/A N/A Level: 0.8 N/A N/A Debridement A (sq cm): rea Curette N/A N/A Instrument: Minimum N/A N/A Bleeding: Pressure N/A N/A Hemostasis A chieved: 0 N/A N/A Procedural Pain: 0 N/A N/A Post Procedural Pain: Procedure was tolerated well N/A N/A Debridement Treatment Response: 1x0.8x0.1 N/A N/A Post Debridement Measurements L x W x D (cm) 0.063 N/A N/A Post Debridement Volume: (cm) Compression Therapy N/A N/A Procedures Performed: Debridement Treatment Notes Electronic Signature(s) Signed: 04/28/2022 3:47:10 PM By: Fredirick Maudlin MD FACS Signed: 04/28/2022 5:32:47 PM By: Baruch Gouty RN, BSN Entered By: Fredirick Maudlin on 04/28/2022 15:47:10 -------------------------------------------------------------------------------- Multi-Disciplinary Care Plan Details Patient Name: Date of Service: Kasandra Knudsen 04/28/2022 2:45 PM Medical Record Number: [id number] Patient Account Number: [id number] Date of Birth/Sex: Treating RN: [birth year] (37 y.o. [name] Primary Care Lofton Leon: PA Haig Prophet, [place] Other Clinician: Referring Jalonda Antigua: Treating Barrie Wale/Extender: Lenell Antu in Treatment: 3 Multidisciplinary Care Plan reviewed with physician Active Inactive Nutrition Nursing Diagnoses: Impaired glucose control: actual or potential Potential for alteratiion in Nutrition/Potential for imbalanced nutrition Goals: Patient/caregiver will maintain therapeutic glucose control Date Initiated:  04/06/2022 Target Resolution Date: 05/07/2022 Goal Status: Active Interventions: Assess HgA1c results as ordered upon admission and as needed Assess patient nutrition upon admission and as needed per policy Provide education on elevated blood sugars and impact on wound healing Provide education on nutrition Notes: Venous Leg Ulcer Nursing Diagnoses: Knowledge deficit related to disease process and management Potential for venous Insuffiency (use before diagnosis confirmed) Goals: Patient will maintain optimal edema control Date Initiated: 04/06/2022 Target Resolution Date: 05/07/2022 Goal Status: Active Interventions: Assess peripheral edema status Jeffery Villa visit. Compression as ordered Provide education on venous insufficiency Notes: Wound/Skin Impairment Nursing Diagnoses: Impaired tissue integrity Knowledge deficit related to ulceration/compromised skin integrity Goals: Patient/caregiver will verbalize understanding of skin care regimen Date Initiated: 04/06/2022 Target Resolution Date: 05/07/2022 Goal Status: Active Interventions: Assess patient/caregiver ability to obtain necessary supplies Assess patient/caregiver ability to perform ulcer/skin care regimen upon admission and as needed Assess ulceration(s) Jeffery Villa visit Provide education on ulcer and skin care Notes: Electronic Signature(s) Signed: 04/28/2022 5:32:47 PM By: Baruch Gouty RN, BSN Entered By: Baruch Gouty on 04/28/2022 15:40:37 --------------------------------------------------------------------------------  Pain Assessment Details Patient Name: Date of Service: JOZIYAH, ROBLERO 04/28/2022 2:45 PM Medical Record Number: [id number] Patient Account Number: [id number] Date of Birth/Sex: Treating RN: [birth year] (36 y.o. Jeffery Villa Primary Care Latish Toutant: PA Zenovia Jordan, [place] Other Clinician: Referring Vandell Kun: Treating Tashema Tiller/Extender: Elveria Rising in Treatment: 3 Active  Problems Location of Pain Severity and Description of Pain Patient Has Paino No Site Locations Rate the pain. Current Pain Level: 0 Pain Management and Medication Current Pain Management: Electronic Signature(s) Signed: 04/28/2022 5:32:47 PM By: Zenaida Deed RN, BSN Entered By: Zenaida Deed on 04/28/2022 15:30:30 -------------------------------------------------------------------------------- Patient/Caregiver Education Details Patient Name: Date of Service: Jeffery Villa 7/5/2023andnbsp2:45 PM Medical Record Number: [id number] Patient Account Number: [id number] Date of Birth/Gender: Treating RN: [birth year] (36 y.o. Jeffery Villa Primary Care Physician: PA Zenovia Jordan, [place] Other Clinician: Referring Physician: Treating Physician/Extender: Elveria Rising in Treatment: 3 Education Assessment Education Provided To: Patient Education Topics Provided Venous: Methods: Explain/Verbal Responses: Reinforcements needed, State content correctly Wound/Skin Impairment: Methods: Explain/Verbal Responses: Reinforcements needed, State content correctly Electronic Signature(s) Signed: 04/28/2022 5:32:47 PM By: Zenaida Deed RN, BSN Entered By: Zenaida Deed on 04/28/2022 15:41:02 -------------------------------------------------------------------------------- Wound Assessment Details Patient Name: Date of Service: Jeffery Villa 04/28/2022 2:45 PM Medical Record Number: [id number] Patient Account Number: [id number] Date of Birth/Sex: Treating RN: Nov 13, 1985 (36 y.o. Jeffery Villa Primary Care Samari Gorby: PA Zenovia Jordan, [place] Other Clinician: Referring Kylia Grajales: Treating Jeffrey Graefe/Extender: Elveria Rising in Treatment: 3 Wound Status Wound Number: 1 Primary Venous Leg Ulcer Etiology: Wound Location: Left, Lateral Lower Leg Wound Status: Open Wounding Event: Blister Comorbid Congestive Heart Failure, Hypertension, Type  II Diabetes, Date Acquired: 03/09/2022 History: Neuropathy Weeks Of Treatment: 3 Clustered Wound: No Photos Wound Measurements Length: (cm) 1 Width: (cm) 0.8 Depth: (cm) 0.1 Area: (cm) 0.628 Volume: (cm) 0.063 Wound Description Classification: Full Thickness Without Exposed Support Structu Wound Margin: Distinct, outline attached Exudate Amount: Medium Exudate Type: Purulent Exudate Color: yellow, brown, green Foul Odor After Cleansing: Slough/Fibrino % Reduction in Area: 33.3% % Reduction in Volume: 33% Epithelialization: Small (1-33%) Tunneling: No Undermining: No res No Yes Wound Bed Granulation Amount: Medium (34-66%) Exposed Structure Granulation Quality: Pink Fascia Exposed: No Necrotic Amount: Medium (34-66%) Fat Layer (Subcutaneous Tissue) Exposed: Yes Necrotic Quality: Adherent Slough Tendon Exposed: No Muscle Exposed: No Joint Exposed: No Bone Exposed: No Treatment Notes Wound #1 (Lower Leg) Wound Laterality: Left, Lateral Cleanser Soap and Water Discharge Instruction: May shower and wash wound with dial antibacterial soap and water prior to dressing change. Wound Cleanser Discharge Instruction: Cleanse the wound with wound cleanser prior to applying a clean dressing using gauze sponges, not tissue or cotton balls. Peri-Wound Care Triamcinolone 15 (g) Discharge Instruction: Use triamcinolone 15 (g) as directed Sween Lotion (Moisturizing lotion) Discharge Instruction: Apply moisturizing lotion as directed Topical Primary Dressing KerraCel Ag Gelling Fiber Dressing, 2x2 in (silver alginate) Discharge Instruction: Apply silver alginate to wound bed as instructed Secondary Dressing Woven Gauze Sponge, Non-Sterile 4x4 in Discharge Instruction: Apply over primary dressing as directed. Secured With Compression Wrap ThreePress (3 layer compression wrap) Discharge Instruction: Apply three layer compression as directed. Compression  Stockings Add-Ons Electronic Signature(s) Signed: 04/28/2022 5:32:47 PM By: Zenaida Deed RN, BSN Entered By: Zenaida Deed on 04/28/2022 15:39:18 -------------------------------------------------------------------------------- Vitals Details Patient Name: Date of Service: Jeffery Villa 04/28/2022 2:45 PM Medical Record Number: [id number] Patient Account Number: [id number] Date of Birth/Sex: Treating RN: 1986-03-19 (36 y.o. Jeffery Villa Primary  Care Marymargaret Kirker: PA Haig Prophet, [place] Other Clinician: Referring Lillyanna Glandon: Treating Shemicka Cohrs/Extender: Lenell Antu in Treatment: 3 Vital Signs Time Taken: 15:29 Temperature (F): 97.8 Height (in): 74 Pulse (bpm): 99 Weight (lbs): 250 Respiratory Rate (breaths/min): 18 Body Mass Index (BMI): 32.1 Blood Pressure (mmHg): 124/89 Capillary Blood Glucose (mg/dl): 153 Reference Range: 80 - 120 mg / dl Notes glucose per pt report this am Electronic Signature(s) Signed: 04/28/2022 5:32:47 PM By: Baruch Gouty RN, BSN Entered By: Baruch Gouty on 04/28/2022 15:30:16

## 2022-04-29 ENCOUNTER — Telehealth (HOSPITAL_COMMUNITY): Payer: Self-pay | Admitting: Surgery

## 2022-04-29 DIAGNOSIS — I5023 Acute on chronic systolic (congestive) heart failure: Secondary | ICD-10-CM

## 2022-04-29 NOTE — Telephone Encounter (Signed)
-----   Message from Andrey Farmer, [place] sent at 04/28/2022  4:54 PM EDT ----- Kidney function stable. Potassium upper normal. Stop taking K supplement for now. Recheck labs at f/u 07/10.

## 2022-04-29 NOTE — Telephone Encounter (Signed)
I contacted patient to review results and recommendations per provider.  Patient aware and agreeable.  Labs added to appt notes for 7/10.

## 2022-04-30 ENCOUNTER — Other Ambulatory Visit (HOSPITAL_COMMUNITY): Payer: Self-pay

## 2022-05-03 ENCOUNTER — Ambulatory Visit (HOSPITAL_COMMUNITY)
Admission: RE | Admit: 2022-05-03 | Discharge: 2022-05-03 | Disposition: A | Discharge status: Home / Self Care | Payer: 59 | Source: Ambulatory Visit | Attending: Family Medicine | Admitting: Family Medicine

## 2022-05-03 ENCOUNTER — Encounter (HOSPITAL_COMMUNITY): Payer: Self-pay

## 2022-05-03 VITALS — BP 118/92 | HR 96 | Wt 239.4 lb

## 2022-05-03 DIAGNOSIS — N1832 Chronic kidney disease, stage 3b: Secondary | ICD-10-CM

## 2022-05-03 DIAGNOSIS — I5022 Chronic systolic (congestive) heart failure: Secondary | ICD-10-CM

## 2022-05-03 DIAGNOSIS — I130 Hypertensive heart and chronic kidney disease with heart failure and stage 1 through stage 4 chronic kidney disease, or unspecified chronic kidney disease: Secondary | ICD-10-CM | POA: Diagnosis not present

## 2022-05-03 DIAGNOSIS — S81802A Unspecified open wound, left lower leg, initial encounter: Secondary | ICD-10-CM

## 2022-05-03 DIAGNOSIS — Z79899 Other long term (current) drug therapy: Secondary | ICD-10-CM | POA: Diagnosis not present

## 2022-05-03 DIAGNOSIS — G4733 Obstructive sleep apnea (adult) (pediatric): Secondary | ICD-10-CM | POA: Diagnosis not present

## 2022-05-03 DIAGNOSIS — I272 Pulmonary hypertension, unspecified: Secondary | ICD-10-CM | POA: Diagnosis not present

## 2022-05-03 DIAGNOSIS — I081 Rheumatic disorders of both mitral and tricuspid valves: Secondary | ICD-10-CM | POA: Insufficient documentation

## 2022-05-03 DIAGNOSIS — Z794 Long term (current) use of insulin: Secondary | ICD-10-CM | POA: Diagnosis not present

## 2022-05-03 DIAGNOSIS — Z7901 Long term (current) use of anticoagulants: Secondary | ICD-10-CM | POA: Insufficient documentation

## 2022-05-03 DIAGNOSIS — I10 Essential (primary) hypertension: Secondary | ICD-10-CM

## 2022-05-03 DIAGNOSIS — Z7984 Long term (current) use of oral hypoglycemic drugs: Secondary | ICD-10-CM | POA: Insufficient documentation

## 2022-05-03 DIAGNOSIS — E1122 Type 2 diabetes mellitus with diabetic chronic kidney disease: Secondary | ICD-10-CM | POA: Diagnosis not present

## 2022-05-03 DIAGNOSIS — I340 Nonrheumatic mitral (valve) insufficiency: Secondary | ICD-10-CM

## 2022-05-03 DIAGNOSIS — E785 Hyperlipidemia, unspecified: Secondary | ICD-10-CM | POA: Insufficient documentation

## 2022-05-03 DIAGNOSIS — I252 Old myocardial infarction: Secondary | ICD-10-CM | POA: Diagnosis not present

## 2022-05-03 DIAGNOSIS — E782 Mixed hyperlipidemia: Secondary | ICD-10-CM

## 2022-05-03 DIAGNOSIS — N183 Chronic kidney disease, stage 3 unspecified: Secondary | ICD-10-CM

## 2022-05-03 LAB — BASIC METABOLIC PANEL
Anion gap: 10 (ref 5–15)
BUN: 58 mg/dL — ABNORMAL HIGH (ref 6–20)
CO2: 28 mmol/L (ref 22–32)
Calcium: 9.3 mg/dL (ref 8.9–10.3)
Chloride: 100 mmol/L (ref 98–111)
Creatinine, Ser: 2.44 mg/dL — ABNORMAL HIGH (ref 0.61–1.24)
GFR, Estimated: 35 mL/min — ABNORMAL LOW (ref >60)
Glucose, Bld: 156 mg/dL — ABNORMAL HIGH (ref 70–99)
Potassium: 4.7 mmol/L (ref 3.5–5.1)
Sodium: 138 mmol/L (ref 135–145)

## 2022-05-03 LAB — BRAIN NATRIURETIC PEPTIDE: B Natriuretic Peptide: 2297.5 pg/mL — ABNORMAL HIGH (ref 0.0–100.0)

## 2022-05-03 MED ORDER — CARVEDILOL 12.5 MG PO TABS: 18.7500 mg | ORAL_TABLET | Freq: Two times a day (BID) | ORAL | 3 refills | Status: DC

## 2022-05-03 NOTE — Patient Instructions (Signed)
INCREASE Coreg to 18.75 mg (one and one half tab) twice a day  Labs today We will only contact you if something comes back abnormal or we need to make some changes. Otherwise no news is good news!  You have been referred to [facility] -they will be in contact with appointment  Keep cardiology follow up as scheduled  Do the following things EVERYDAY: Weigh yourself in the morning before breakfast. Write it down and keep it in a log. Take your medicines as prescribed Eat low salt foods--Limit salt (sodium) to 2000 mg per day.  Stay as active as you can everyday Limit all fluids for the day to less than 2 liters  At [facility], you and your health needs are our priority. As part of our continuing mission to provide you with exceptional heart care, we have created designated Provider Care Teams. These Care Teams include your primary Cardiologist (physician) and Advanced Practice Providers (APPs- Physician Assistants and Nurse Practitioners) who all work together to provide you with the care you need, when you need it.   You may see any of the following providers on your designated Care Team at your next follow up: Dr Arvilla Meres Dr Carron Curie, NP Robbie Lis, [place] [facility], [place] Karle Plumber, PharmD   Please be sure to bring in all your medications bottles to every appointment.   If you have any questions or concerns before your next appointment please send [place] a message through [place] or call our office at [phone].    TO LEAVE A MESSAGE FOR THE NURSE SELECT OPTION 2, PLEASE LEAVE A MESSAGE INCLUDING: YOUR NAME DATE OF BIRTH CALL BACK NUMBER REASON FOR CALL**this is important as we prioritize the call backs  YOU WILL RECEIVE A CALL BACK THE SAME DAY AS LONG AS YOU CALL BEFORE 4:00 PM

## 2022-05-03 NOTE — Progress Notes (Signed)
ADVANCED HF CLINIC CLINIC NOTE   PCP: N/A HF Cardiologist: Dr. Aundra Dubin  HPI: [name] is a 36 y.o. male with past medical history of hypertension, hyperlipidemia, insulin-dependent diabetes mellitus since 36 years of age, CKD stage III, OSA and chronic systolic heart failure.    He was seen 6/22 by PCP, referred to cardiology for echo. Echo 9/22 showed EF 20 to 25%, moderate to severe MR, moderate to severe TR, RVSP 60 mmHg.  He was started on GDMT. Zio placed 10/22 showing average heart rate 92, patient remained tachycardic 8.5% of total time, 3 episodes of wide-complex tachycardia with longest run 20 beats.   He saw his cardiologist, Dr. Marina Goodell of [facility] cardiology, 10/22.  It was recommended he undergo outpatient Myoview, start LifeVest, then repeat echocardiogram in 3 months.  Unfortunately, insurance did not cover [facility] and he did not follow back up. He wore the LifeVest for about a week before he discontinued it.    Admitted 5/23 with left lower extremity pain. WBC 30.7, felt he was also slightly volume overloaded. Diuresed with IV lasix and placed on abx. LE dopplers negative for DVT.  Echo showed EF 20-25%, global hypokinesis with moderate LV dilation, mod-severe MR (severe by PISA ERO), moderate TR.  He underwent  RHC showing mildly elevated PCWP and normal RA pressure with mild pulmonary venous hypertension, CI 3.33 (off milrinone x 1 hour). No LHC with elevated SCr. Cardiac MRI showed >50% wall thickness subendocardial LGE in the mid anteroseptal wall, looks like a coronary disease pattern suggestive of prior MI. LVEF 15%. GDMT limited by AKI. Plan for ischemic eval in future when creatinine comes down. Discharged home, weight 264 lbs.  He presented for visit with PharmD for medication titration on 06/27. He was added on for urgent APP appointment. Wt noted to be up ~20 lb since prior APP clinic visit on 6/1. ReDs Clip elevated 59%. Admitted to dietary indiscretion w/  sodium. He was given 80 mg lasix IV and started on furoscix 80 mg daily X 2 days. Jardiance restarted.   Follow up 04/23/22, remained overloaded, ReDs 58%. Torsemide increased to 40 bid and metolazone added weekly.   Today he returns for HF follow up. Overall feeling fine. He is not SOB with activity. Denies palpitations, CP, dizziness, edema, or PND/Orthopnea. Appetite ok. No fever or chills. Weight at home 237 pounds. Taking all medications.   ReDs Clip: 43%  Labs (7/23): K 5.0, creatinine 2.44   Review of Systems: Cardiac and Respiratory. Negative except as mentioned in HPI.  Past Medical History:  Diagnosis Date   CHF (congestive heart failure) (HCC)    Diabetes mellitus without complication ([place])    diagnosed at age 36 due to obesity, started on insulin at that time    High cholesterol    Hypertension    Current Outpatient Medications  Medication Sig Dispense Refill   acetaminophen (TYLENOL) 325 MG tablet Take 2 tablets (650 mg total) by mouth every 6 (six) hours as needed for mild pain (or Fever >/= 101).     aspirin 81 MG EC tablet Take 1 tablet (81 mg total) by mouth daily. 30 tablet 0   atorvastatin (LIPITOR) 40 MG tablet Take 1 tablet (40 mg total) by mouth daily. 90 tablet 3   blood glucose meter kit and supplies Dispense based on patient and insurance preference. Use up to four times daily as directed. (FOR ICD-10 E10.9, E11.9). 1 each 0   carvedilol (COREG) 25 MG tablet  Take 0.5 tablets (12.5 mg total) by mouth 2 (two) times daily. 180 tablet 3   dapagliflozin propanediol (FARXIGA) 10 MG TABS tablet Take 1 tablet (10 mg total) by mouth daily before breakfast. 30 tablet 3   glipiZIDE (GLUCOTROL) 5 MG tablet Take 1 tablet (5 mg total) by mouth daily before breakfast. 30 tablet 0   hydrALAZINE (APRESOLINE) 25 MG tablet Take 1 tablet (25 mg total) by mouth 3 (three) times daily. 260 tablet 3   Insulin Pen Needle 30G X 5 MM MISC New needle with every insulin adminsration 100  each 5   isosorbide mononitrate (IMDUR) 30 MG 24 hr tablet Take 1 tablet (30 mg total) by mouth daily. 90 tablet 3   metolazone (ZAROXOLYN) 2.5 MG tablet Take 1 tablet (2.5 mg total) by mouth once a week. On Saturdays only 13 tablet 3   torsemide (DEMADEX) 20 MG tablet Take 2 tablets (40 mg total) by mouth 2 (two) times daily. 360 tablet 3   No current facility-administered medications for this encounter.   No Known Allergies  Social History   Socioeconomic History   Marital status: Single    Spouse name: Not on file   Number of children: Not on file   Years of education: Not on file   Highest education level: Not on file  Occupational History   Not on file  Tobacco Use   Smoking status: Never   Smokeless tobacco: Not on file  Vaping Use   Vaping Use: Never used  Substance and Sexual Activity   Alcohol use: No    Alcohol/week: 0.0 standard drinks of alcohol   Drug use: No   Sexual activity: Not on file  Other Topics Concern   Not on file  Social History Narrative   Not on file   Social Determinants of Health   Financial Resource Strain: Not on file  Food Insecurity: Not on file  Transportation Needs: Not on file  Physical Activity: Not on file  Stress: Not on file  Social Connections: Not on file  Intimate Partner Violence: Not on file   Family History  Problem Relation Age of Onset   Diabetes Mother    Hypertension Mother    Stroke Mother    Alcohol abuse Father    Cancer Maternal Grandmother    Diabetes Maternal Grandmother    Hypertension Maternal Grandmother    His mother also had heart failure in her 46s and passed away from the heart failure.    BP (!) 118/92   Pulse 96   Wt 108.6 kg (239 lb 6.4 oz)   SpO2 98%   BMI 30.74 kg/m   Wt Readings from Last 3 Encounters:  05/03/22 108.6 kg (239 lb 6.4 oz)  04/23/22 119.9 kg (264 lb 6.4 oz)  04/20/22 123.6 kg (272 lb 6.4 oz)   PHYSICAL EXAM: General:  NAD. No resp difficulty HEENT: Normal Neck:  Supple. No JVD. Carotids 2+ bilat; no bruits. No lymphadenopathy or thryomegaly appreciated. Cor: PMI nondisplaced. Regular rate & rhythm. No rubs, gallops or murmurs. Lungs: Clear Abdomen: Soft, nontender, nondistended. No hepatosplenomegaly. No bruits or masses. Good bowel sounds. Extremities: No cyanosis, clubbing, rash, edema; LLE wrapped Neuro: Alert & oriented x 3, cranial nerves grossly intact. Moves all 4 extremities w/o difficulty. Affect pleasant.  ASSESSMENT & PLAN: 1. Chronic systolic CHF: Cardiomyopathy of uncertain etiology.  Echo in 9/22 with EF 20-25%, moderate-severe MR, moderate-severe TR.  Echo 05/23 EF 20-25%, global hypokinesis with moderate LV dilation, mod-severe  MR (severe by PISA ERO), moderate TR.  He has had HTN since his teens, could be due to long-standing poorly controlled HTN. With DM/HTN/hyperlipidemia x years, CAD must be a consideration though no chest pain. Prior viral myocarditis is possible as well. [place] 05/23: mildly elevated PCWP and normal RA pressure with mild pulmonary venous hypertension, CI 3.33 (off milrinone for about an hour).  cMRI limited study, difficult images due to problems with breath-holding. >50% wall thickness subendocardial LGE in the mid anteroseptal wall, looks like a coronary disease pattern suggestive of prior MI. LVEF 15%. Narrow QRS, not CRT candidate. NYHA I-II, he is not volume overloaded today, weight down 25 lbs. REDs 43% (? Accuracy with body habitus). - Increase carvedilol to 18.75 mg bid. - Continue torsemide 40 mg bid + metolazone 2.5 mg on Saturdays. BMET/BNP today. - Continue Farxiga 10 mg daily. - Continue hydralazine 25 mg tid + Imdur 30 mg daily. - Eventual Entresto if creatinine comes down.  - Ideally, would have coronary angiography but need improved creatinine.  At some point, will need ischemic evaluation.  LHC if creatinine decreases to below 2 versus Cardiolite if it stays above 2.  2. 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 BMET today. - Has been referred [facility].  - Continue SGLT2i. 3. Type 2 diabetes: Early onset. Last A1c 7.3 - Continue SGLT2i. 4. HTN: Early onset in teens. Controlled on current meds. - GDMT per above  5. Hyperlipidemia: Continue atorvastatin.  6. Diabetic foot s/p toe amputation.  7. Mitral regurgitation: Possibly severe on echo. Appears at least mod on cMRI but regurgitant fraction 13% suggests mild MR. Suspect calculation is inaccurate given poor images generally. Most likely functional.   - Will need eventual evaluation by TEE and consideration for Mitraclip if this does not improve with medical management.  8. LLE wound: Recent cellulitis and treated with IV ceftriaxone and vanc. Followed by [facility] now. 9. OSA: AHI 35. Previously followed by Neurology at [facility], but they are out of network with his insurance. Having trouble tolerating CPAP. - Refer to sleep medicine.  Follow up next month with Dr. Aundra Dubin + echo, as scheduled.  [place], FNP-BC 05/03/22

## 2022-05-04 ENCOUNTER — Encounter (HOSPITAL_BASED_OUTPATIENT_CLINIC_OR_DEPARTMENT_OTHER): Payer: 59 | Admitting: General Surgery

## 2022-05-04 DIAGNOSIS — E11622 Type 2 diabetes mellitus with other skin ulcer: Secondary | ICD-10-CM | POA: Diagnosis not present

## 2022-05-04 NOTE — Progress Notes (Signed)
RYUN, VELEZ ([id number]) Visit Report for 05/04/2022 Arrival Information Details Patient Name: Date of Service: Jeffery Villa, Jeffery Villa 05/04/2022 1:15 PM Medical Record Number: [id number] Patient Account Number: [id number] Date of Birth/Sex: Treating RN: [birth year] (36 y.o. [name] Primary Care Trevis Eden: PA Zenovia Jordan, [place] Other Clinician: Referring Carsen Machi: Treating Vuong Musa/Extender: Elveria Rising in Treatment: 4 Visit Information History Since Last Visit Added or deleted any medications: No Patient Arrived: Ambulatory Any new allergies or adverse reactions: No Arrival Time: 13:49 Had a fall or experienced change in No Accompanied By: self activities of daily living that may affect Transfer Assistance: None risk of falls: Patient Identification Verified: Yes Signs or symptoms of abuse/neglect since last visito No Secondary Verification Process Completed: Yes Hospitalized since last visit: No Patient Requires Transmission-Based Precautions: No Implantable device outside of the clinic excluding No Patient Has Alerts: Yes cellular tissue based products placed in the center Patient Alerts: L ABI non compressible since last visit: Has Dressing in Place as Prescribed: Yes Has Compression in Place as Prescribed: Yes Pain Present Now: No Electronic Signature(s) Signed: 05/04/2022 6:01:19 PM By: Zenaida Deed RN, BSN Entered By: Zenaida Deed on 05/04/2022 13:55:59 -------------------------------------------------------------------------------- Compression Therapy Details Patient Name: Date of Service: Jeffery Villa 05/04/2022 1:15 PM Medical Record Number: [id number] Patient Account Number: [id number] Date of Birth/Sex: Treating RN: [birth year] (36 y.o. [name] Primary Care Hershey Knauer: PA Zenovia Jordan, [place] Other Clinician: Referring Zayde Stroupe: Treating Telina Kleckley/Extender: Elveria Rising in Treatment:  4 Compression Therapy Performed for Wound Assessment: Wound #1 Left,Lateral Lower Leg Performed By: Clinician Zenaida Deed, RN Compression Type: Three Layer Post Procedure Diagnosis Same as Pre-procedure Electronic Signature(s) Signed: 05/04/2022 6:01:19 PM By: Zenaida Deed RN, BSN Entered By: Zenaida Deed on 05/04/2022 14:12:13 -------------------------------------------------------------------------------- Encounter Discharge Information Details Patient Name: Date of Service: Jeffery Villa 05/04/2022 1:15 PM Medical Record Number: [id number] Patient Account Number: [id number] Date of Birth/Sex: Treating RN: July 11, 1986 (36 y.o. [name] Primary Care Tamar Lipscomb: PA Zenovia Jordan, [place] Other Clinician: Referring Aliyanna Wassmer: Treating Aleathea Pugmire/Extender: Elveria Rising in Treatment: 4 Encounter Discharge Information Items Discharge Condition: Stable Ambulatory Status: Ambulatory Discharge Destination: Home Transportation: Private Auto Accompanied By: self Schedule Follow-up Appointment: Yes Clinical Summary of Care: Patient Declined Electronic Signature(s) Signed: 05/04/2022 6:01:19 PM By: Zenaida Deed RN, BSN Entered By: Zenaida Deed on 05/04/2022 14:26:39 -------------------------------------------------------------------------------- Lower Extremity Assessment Details Patient Name: Date of Service: Jeffery Villa 05/04/2022 1:15 PM Medical Record Number: [id number] Patient Account Number: [id number] Date of Birth/Sex: Treating RN: 22-Feb-1986 (36 y.o. [name] Primary Care Kestrel Mis: PA Zenovia Jordan, [place] Other Clinician: Referring Jaylenn Altier: Treating Govanni Plemons/Extender: Elveria Rising in Treatment: 4 Edema Assessment Assessed: [Left: No] [Right: No] Edema: [Left: Ye] [Right: s] Calf Left: Right: Point of Measurement: 35 cm From Medial Instep 38.5 cm Ankle Left: Right: Point of Measurement: 9 cm  From Medial Instep 26 cm Vascular Assessment Pulses: Dorsalis Pedis Palpable: [Left:Yes] Electronic Signature(s) Signed: 05/04/2022 6:01:19 PM By: Zenaida Deed RN, BSN Entered By: Zenaida Deed on 05/04/2022 13:59:48 -------------------------------------------------------------------------------- Multi Wound Chart Details Patient Name: Date of Service: Jeffery Villa 05/04/2022 1:15 PM Medical Record Number: [id number] Patient Account Number: [id number] Date of Birth/Sex: Treating RN: 28-Dec-1985 (36 y.o. [name] Primary Care Tate Jerkins: PA Zenovia Jordan, [place] Other Clinician: Referring Aayat Hajjar: Treating Tanner Vigna/Extender: Elveria Rising in Treatment: 4 Vital Signs Height(in): 74 Capillary Blood Glucose(mg/dl): 397 Weight(lbs): 673 Pulse(bpm): 94 Body Mass Index(BMI): 32.1  Blood Pressure(mmHg): 113/80 Temperature(F): 98 Respiratory Rate(breaths/min): 18 Photos: [N/A:N/A] Left, Lateral Lower Leg N/A N/A Wound Location: Blister N/A N/A Wounding Event: Venous Leg Ulcer N/A N/A Primary Etiology: Congestive Heart Failure, N/A N/A Comorbid History: Hypertension, Type II Diabetes, Neuropathy 03/09/2022 N/A N/A Date Acquired: 4 N/A N/A Weeks of Treatment: Open N/A N/A Wound Status: No N/A N/A Wound Recurrence: 0.7x0.4x0.1 N/A N/A Measurements L x W x D (cm) 0.22 N/A N/A A (cm) : rea 0.022 N/A N/A Volume (cm) : 76.60% N/A N/A % Reduction in Area: 76.60% N/A N/A % Reduction in Volume: Full Thickness Without Exposed N/A N/A Classification: Support Structures Medium N/A N/A Exudate Amount: Serosanguineous N/A N/A Exudate Type: red, brown N/A N/A Exudate Color: Distinct, outline attached N/A N/A Wound Margin: Medium (34-66%) N/A N/A Granulation Amount: Pink N/A N/A Granulation Quality: Medium (34-66%) N/A N/A Necrotic Amount: Fat Layer (Subcutaneous Tissue): Yes N/A N/A Exposed Structures: Fascia: No Tendon:  No Muscle: No Joint: No Bone: No Small (1-33%) N/A N/A Epithelialization: Compression Therapy N/A N/A Procedures Performed: Treatment Notes Wound #1 (Lower Leg) Wound Laterality: Left, Lateral Cleanser Soap and Water Discharge Instruction: May shower and wash wound with dial antibacterial soap and water prior to dressing change. Wound Cleanser Discharge Instruction: Cleanse the wound with wound cleanser prior to applying a clean dressing using gauze sponges, not tissue or cotton balls. Peri-Wound Care Triamcinolone 15 (g) Discharge Instruction: Use triamcinolone 15 (g) as directed Sween Lotion (Moisturizing lotion) Discharge Instruction: Apply moisturizing lotion as directed Topical Primary Dressing KerraCel Ag Gelling Fiber Dressing, 2x2 in (silver alginate) Discharge Instruction: Apply silver alginate to wound bed as instructed Secondary Dressing Woven Gauze Sponge, Non-Sterile 4x4 in Discharge Instruction: Apply over primary dressing as directed. Secured With Compression Wrap ThreePress (3 layer compression wrap) Discharge Instruction: Apply three layer compression as directed. Compression Stockings Add-Ons Electronic Signature(s) Signed: 05/04/2022 2:49:17 PM By: Duanne Guess MD FACS Signed: 05/04/2022 6:01:19 PM By: Zenaida Deed RN, BSN Entered By: Duanne Guess on 05/04/2022 14:49:17 -------------------------------------------------------------------------------- Multi-Disciplinary Care Plan Details Patient Name: Date of Service: Jeffery Villa 05/04/2022 1:15 PM Medical Record Number: [id number] Patient Account Number: [id number] Date of Birth/Sex: Treating RN: Mar 03, 1986 (36 y.o. [name] Primary Care Trequan Marsolek: PA Zenovia Jordan, [place] Other Clinician: Referring Lamonte Hartt: Treating Steph Cheadle/Extender: Elveria Rising in Treatment: 4 Multidisciplinary Care Plan reviewed with physician Active Inactive Nutrition Nursing  Diagnoses: Impaired glucose control: actual or potential Potential for alteratiion in Nutrition/Potential for imbalanced nutrition Goals: Patient/caregiver will maintain therapeutic glucose control Date Initiated: 04/06/2022 Target Resolution Date: 05/07/2022 Goal Status: Active Interventions: Assess HgA1c results as ordered upon admission and as needed Assess patient nutrition upon admission and as needed per policy Provide education on elevated blood sugars and impact on wound healing Provide education on nutrition Notes: Venous Leg Ulcer Nursing Diagnoses: Knowledge deficit related to disease process and management Potential for venous Insuffiency (use before diagnosis confirmed) Goals: Patient will maintain optimal edema control Date Initiated: 04/06/2022 Target Resolution Date: 05/07/2022 Goal Status: Active Interventions: Assess peripheral edema status every visit. Compression as ordered Provide education on venous insufficiency Notes: Wound/Skin Impairment Nursing Diagnoses: Impaired tissue integrity Knowledge deficit related to ulceration/compromised skin integrity Goals: Patient/caregiver will verbalize understanding of skin care regimen Date Initiated: 04/06/2022 Target Resolution Date: 05/07/2022 Goal Status: Active Interventions: Assess patient/caregiver ability to obtain necessary supplies Assess patient/caregiver ability to perform ulcer/skin care regimen upon admission and as needed Assess ulceration(s) every visit Provide education on ulcer and skin care Notes:  Electronic Signature(s) Signed: 05/04/2022 6:01:19 PM By: Zenaida Deed RN, BSN Entered By: Zenaida Deed on 05/04/2022 14:09:34 -------------------------------------------------------------------------------- Pain Assessment Details Patient Name: Date of Service: Jeffery Villa 05/04/2022 1:15 PM Medical Record Number: [id number] Patient Account Number: [id number] Date of  Birth/Sex: Treating RN: 02/12/1986 (36 y.o. [name] Primary Care Arkel Cartwright: PA Zenovia Jordan, [place] Other Clinician: Referring Fabiana Dromgoole: Treating Barlow Harrison/Extender: Elveria Rising in Treatment: 4 Active Problems Location of Pain Severity and Description of Pain Patient Has Paino No Site Locations Rate the pain. Current Pain Level: 0 Pain Management and Medication Current Pain Management: Electronic Signature(s) Signed: 05/04/2022 6:01:19 PM By: Zenaida Deed RN, BSN Entered By: Zenaida Deed on 05/04/2022 13:56:35 -------------------------------------------------------------------------------- Patient/Caregiver Education Details Patient Name: Date of Service: Jeffery Villa 7/11/2023andnbsp1:15 PM Medical Record Number: [id number] Patient Account Number: [id number] Date of Birth/Gender: Treating RN: 1986-07-09 (36 y.o. [name] Primary Care Physician: PA Zenovia Jordan, [place] Other Clinician: Referring Physician: Treating Physician/Extender: Elveria Rising in Treatment: 4 Education Assessment Education Provided To: Patient Education Topics Provided Elevated Blood Sugar/ Impact on Healing: Methods: Explain/Verbal Responses: Reinforcements needed, State content correctly Venous: Methods: Explain/Verbal Responses: Reinforcements needed, State content correctly Electronic Signature(s) Signed: 05/04/2022 6:01:19 PM By: Zenaida Deed RN, BSN Entered By: Zenaida Deed on 05/04/2022 14:09:56 -------------------------------------------------------------------------------- Wound Assessment Details Patient Name: Date of Service: Jeffery Villa 05/04/2022 1:15 PM Medical Record Number: [id number] Patient Account Number: [id number] Date of Birth/Sex: Treating RN: 11/14/1985 (36 y.o. [name] Primary Care Yakub Lodes: PA Zenovia Jordan, [place] Other Clinician: Referring Shonique Pelphrey: Treating Kerryn Tennant/Extender: Elveria Rising in Treatment: 4 Wound Status Wound Number: 1 Primary Venous Leg Ulcer Etiology: Wound Location: Left, Lateral Lower Leg Wound Status: Open Wounding Event: Blister Comorbid Congestive Heart Failure, Hypertension, Type II Diabetes, Date Acquired: 03/09/2022 History: Neuropathy Weeks Of Treatment: 4 Clustered Wound: No Photos Wound Measurements Length: (cm) 0.7 Width: (cm) 0.4 Depth: (cm) 0.1 Area: (cm) 0.22 Volume: (cm) 0.022 % Reduction in Area: 76.6% % Reduction in Volume: 76.6% Epithelialization: Small (1-33%) Tunneling: No Undermining: No Wound Description Classification: Full Thickness Without Exposed Support Structures Wound Margin: Distinct, outline attached Exudate Amount: Medium Exudate Type: Serosanguineous Exudate Color: red, brown Foul Odor After Cleansing: No Slough/Fibrino Yes Wound Bed Granulation Amount: Medium (34-66%) Exposed Structure Granulation Quality: Pink Fascia Exposed: No Necrotic Amount: Medium (34-66%) Fat Layer (Subcutaneous Tissue) Exposed: Yes Necrotic Quality: Adherent Slough Tendon Exposed: No Muscle Exposed: No Joint Exposed: No Bone Exposed: No Treatment Notes Wound #1 (Lower Leg) Wound Laterality: Left, Lateral Cleanser Soap and Water Discharge Instruction: May shower and wash wound with dial antibacterial soap and water prior to dressing change. Wound Cleanser Discharge Instruction: Cleanse the wound with wound cleanser prior to applying a clean dressing using gauze sponges, not tissue or cotton balls. Peri-Wound Care Triamcinolone 15 (g) Discharge Instruction: Use triamcinolone 15 (g) as directed Sween Lotion (Moisturizing lotion) Discharge Instruction: Apply moisturizing lotion as directed Topical Primary Dressing KerraCel Ag Gelling Fiber Dressing, 2x2 in (silver alginate) Discharge Instruction: Apply silver alginate to wound bed as instructed Secondary Dressing Woven Gauze Sponge,  Non-Sterile 4x4 in Discharge Instruction: Apply over primary dressing as directed. Secured With Compression Wrap ThreePress (3 layer compression wrap) Discharge Instruction: Apply three layer compression as directed. Compression Stockings Add-Ons Electronic Signature(s) Signed: 05/04/2022 4:46:37 PM By: Samuella Bruin Signed: 05/04/2022 6:01:19 PM By: Zenaida Deed RN, BSN Entered By: Samuella Bruin on 05/04/2022 14:07:24 -------------------------------------------------------------------------------- Vitals Details Patient Name: Date of  Service: Jeffery Villa 05/04/2022 1:15 PM Medical Record Number: [id number] Patient Account Number: [id number] Date of Birth/Sex: Treating RN: Sep 08, 1986 (36 y.o. [name] Primary Care Gean Laursen: PA Zenovia Jordan, [place] Other Clinician: Referring Irem Stoneham: Treating Beau Ramsburg/Extender: Elveria Rising in Treatment: 4 Vital Signs Time Taken: 13:56 Temperature (F): 98 Height (in): 74 Pulse (bpm): 94 Weight (lbs): 250 Respiratory Rate (breaths/min): 18 Body Mass Index (BMI): 32.1 Blood Pressure (mmHg): 113/80 Capillary Blood Glucose (mg/dl): 097 Reference Range: 80 - 120 mg / dl Notes glucose per pt report this am Electronic Signature(s) Signed: 05/04/2022 6:01:19 PM By: Zenaida Deed RN, BSN Entered By: Zenaida Deed on 05/04/2022 13:56:28

## 2022-05-04 NOTE — Progress Notes (Signed)
ZANDON, PINELL ([id number]) Visit Report for 05/04/2022 Chief Complaint Document Details Patient Name: Date of Service: NITHILAN, FREEMON 05/04/2022 1:15 PM Medical Record Number: [id number] Patient Account Number: [id number] Date of Birth/Sex: Treating RN: Feb 04, 1986 (36 y.o. [name] Primary Care Provider: PA Haig Prophet, [place] Other Clinician: Referring Provider: Treating Provider/Extender: Lenell Antu in Treatment: 4 Information Obtained from: Patient Chief Complaint Patient seen for complaints of Non-Healing Wound. Electronic Signature(s) Signed: 05/04/2022 2:49:22 PM By: Fredirick Maudlin MD FACS Entered By: Fredirick Maudlin on 05/04/2022 14:49:22 -------------------------------------------------------------------------------- HPI Details Patient Name: Date of Service: [name] 05/04/2022 1:15 PM Medical Record Number: [id number] Patient Account Number: [id number] Date of Birth/Sex: Treating RN: May 20, 1986 (36 y.o. [name] Primary Care Provider: PA TIENT, [place] Other Clinician: Referring Provider: Treating Provider/Extender: Lenell Antu in Treatment: 4 History of Present Illness HPI Description: ADMISSION 04/06/2022 This is a 36 year old man with type 2 diabetes mellitus (last A1c 7.3% on Mar 06, 2022), severe congestive heart failure with an ejection fraction of around 30%, diabetic nephropathy, hypertension, and recent hospital admission for sepsis due to cellulitis and fluid overload. He was discharged from the hospital and then subsequently developed a blister on his left lateral calf. This opened and as result he was referred to wound care. He has just been keeping a dry dressing on the site. It is fairly small and has a thin layer of dried eschar over the surface. There is no odor or drainage. He does have 2+ nonpitting edema to the bilateral lower extremities. He says that he has worn  compression stockings in the past, but they are 15-20 mmHg pressure and are over 6 months old. 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 ABI in clinic today was noncompressible but he has palpable pedal pulses. 04/13/2022: The large area that was previously a blister has epithelialized. It is still very thin and friable skin, however. The slightly distal wound has some slough accumulation. The patient says that he has been very itchy since his last visit. 04/20/2022: We used triamcinolone last week which has helped with his itching. The distal wound has some slough accumulation, but is a bit smaller than last week. Edema control is good. 04/28/2022: The wound continues to contract. The periwound skin is in better condition this week. Triamcinolone continues to aid in the itching. No concern for infection. 05/04/2022: The wound is about half the size as it was last week. The periwound skin is intact. The wound itself is very clean without any accumulated slough. There is granulation tissue on the surface. Electronic Signature(s) Signed: 05/04/2022 2:49:54 PM By: Fredirick Maudlin MD FACS Entered By: Fredirick Maudlin on 05/04/2022 14:49:54 -------------------------------------------------------------------------------- Physical Exam Details Patient Name: Date of Service: Kasandra Knudsen 05/04/2022 1:15 PM Medical Record Number: [id number] Patient Account Number: [id number] Date of Birth/Sex: Treating RN: January 06, 1986 (36 y.o. [name] Primary Care Provider: PA Haig Prophet, [place] Other Clinician: Referring Provider: Treating Provider/Extender: Lenell Antu in Treatment: 4 Constitutional . . . . No acute distress.Marland Kitchen Respiratory Normal work of breathing on room air.. Notes 05/04/2022: The wound is about half the size as it was last week. The periwound skin is intact. The wound itself is very clean without any accumulated slough. There is  granulation tissue on the surface. Electronic Signature(s) Signed: 05/04/2022 2:52:07 PM By: Fredirick Maudlin MD FACS Entered By: Fredirick Maudlin on 05/04/2022 14:52:07 --------------------------------------------------------------------------------  Physician Orders Details Patient Name: Date of Service: KOLLYN, KOVANDA 05/04/2022 1:15 PM Medical Record Number: [id number] Patient Account Number: [id number] Date of Birth/Sex: Treating RN: 09-22-1986 (36 y.o. [name] Primary Care Provider: PA Haig Prophet, [place] Other Clinician: Referring Provider: Treating Provider/Extender: Lenell Antu in Treatment: 4 Verbal / Phone Orders: No Diagnosis Coding ICD-10 Coding Code Description [phone] Non-pressure chronic ulcer of left calf limited to breakdown of skin [id number] Chronic systolic (congestive) heart failure E11.65 Type 2 diabetes mellitus with hyperglycemia E11.42 Type 2 diabetes mellitus with diabetic polyneuropathy L03.90 Cellulitis, unspecified E11.59 Type 2 diabetes mellitus with other circulatory complications Follow-up Appointments ppointment in 1 week. - Dr. Celine Ahr - Room 1 Return A Tuesday 7/18 @ 07:30 am Bathing/ Shower/ Hygiene May shower with protection but do not get wound dressing(s) wet. - Ok to use Market researcher, can buy at [facility], [facility], or [employer] Edema Control - Lymphedema / SCD / Other Elevate legs to the level of the heart or above for 30 minutes daily and/or when sitting, a frequency of: - throughout the day Avoid standing for long periods of time. Exercise regularly Compression stocking or Garment 20-30 mm/Hg pressure to: - right leg daily Wound Treatment Wound #1 - Lower Leg Wound Laterality: Left, Lateral Cleanser: Soap and Water 1 x Per Week Discharge Instructions: May shower and wash wound with dial antibacterial soap and water prior to dressing change. Cleanser: Wound Cleanser 1 x Per Week Discharge Instructions: Cleanse  the wound with wound cleanser prior to applying a clean dressing using gauze sponges, not tissue or cotton balls. Peri-Wound Care: Triamcinolone 15 (g) 1 x Per Week Discharge Instructions: Use triamcinolone 15 (g) as directed Peri-Wound Care: Sween Lotion (Moisturizing lotion) 1 x Per Week Discharge Instructions: Apply moisturizing lotion as directed Prim Dressing: KerraCel Ag Gelling Fiber Dressing, 2x2 in (silver alginate) 1 x Per Week ary Discharge Instructions: Apply silver alginate to wound bed as instructed Secondary Dressing: Woven Gauze Sponge, Non-Sterile 4x4 in 1 x Per Week Discharge Instructions: Apply over primary dressing as directed. Compression Wrap: ThreePress (3 layer compression wrap) 1 x Per Week Discharge Instructions: Apply three layer compression as directed. Electronic Signature(s) Signed: 05/04/2022 4:27:02 PM By: Fredirick Maudlin MD FACS Entered By: Fredirick Maudlin on 05/04/2022 14:52:20 -------------------------------------------------------------------------------- Problem List Details Patient Name: Date of Service: Kasandra Knudsen 05/04/2022 1:15 PM Medical Record Number: [id number] Patient Account Number: [id number] Date of Birth/Sex: Treating RN: 05/21/1986 (36 y.o. [name] Primary Care Provider: PA Haig Prophet, [place] Other Clinician: Referring Provider: Treating Provider/Extender: Lenell Antu in Treatment: 4 Active Problems ICD-10 Encounter Code Description Active Date MDM Diagnosis L97.221 Non-pressure chronic ulcer of left calf limited to breakdown of skin 04/06/2022 No Yes [id number] Chronic systolic (congestive) heart failure 04/06/2022 No Yes E11.65 Type 2 diabetes mellitus with hyperglycemia 04/06/2022 No Yes E11.42 Type 2 diabetes mellitus with diabetic polyneuropathy 04/06/2022 No Yes E11.59 Type 2 diabetes mellitus with other circulatory complications 0000000 No Yes Inactive Problems ICD-10 Code Description  Active Date Inactive Date L03.90 Cellulitis, unspecified 04/06/2022 04/06/2022 Resolved Problems Electronic Signature(s) Signed: 05/04/2022 2:49:08 PM By: Fredirick Maudlin MD FACS Entered By: Fredirick Maudlin on 05/04/2022 14:49:07 -------------------------------------------------------------------------------- Progress Note Details Patient Name: Date of Service: Kasandra Knudsen 05/04/2022 1:15 PM Medical Record Number: [id number] Patient Account Number: [id number] Date of Birth/Sex: Treating RN: 28-Apr-1986 (36 y.o. [name] Primary Care Provider: PA Haig Prophet, [place] Other Clinician: Referring Provider: Treating Provider/Extender: Roosvelt Maser  Weeks in Treatment: 4 Subjective Chief Complaint Information obtained from Patient Patient seen for complaints of Non-Healing Wound. History of Present Illness (HPI) ADMISSION 04/06/2022 This is a 36 year old man with type 2 diabetes mellitus (last A1c 7.3% on Mar 06, 2022), severe congestive heart failure with an ejection fraction of around 30%, diabetic nephropathy, hypertension, and recent hospital admission for sepsis due to cellulitis and fluid overload. He was discharged from the hospital and then subsequently developed a blister on his left lateral calf. This opened and as result he was referred to wound care. He has just been keeping a dry dressing on the site. It is fairly small and has a thin layer of dried eschar over the surface. There is no odor or drainage. He does have 2+ nonpitting edema to the bilateral lower extremities. He says that he has worn compression stockings in the past, but they are 15-20 mmHg pressure and are over 6 months old. 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 ABI in clinic today was noncompressible but he has palpable pedal pulses. 04/13/2022: The large area that was previously a blister has epithelialized. It is still very thin and friable skin, however. The  slightly distal wound has some slough accumulation. The patient says that he has been very itchy since his last visit. 04/20/2022: We used triamcinolone last week which has helped with his itching. The distal wound has some slough accumulation, but is a bit smaller than last week. Edema control is good. 04/28/2022: The wound continues to contract. The periwound skin is in better condition this week. Triamcinolone continues to aid in the itching. No concern for infection. 05/04/2022: The wound is about half the size as it was last week. The periwound skin is intact. The wound itself is very clean without any accumulated slough. There is granulation tissue on the surface. Patient History Information obtained from Patient. Family History Cancer - Maternal Grandparents, Diabetes - Mother,Maternal Grandparents, Hypertension - Mother, Stroke - Mother. Social History Never smoker, Marital Status - Married, Alcohol Use - Never, Drug Use - No History, Caffeine Use - Rarely. Medical History Cardiovascular Patient has history of Congestive Heart Failure, Hypertension Endocrine Patient has history of Type II Diabetes Neurologic Patient has history of Neuropathy Medical A Surgical History Notes nd Cardiovascular Mitral and tricuspid valve regurgitation Genitourinary CKD-3 Objective Constitutional No acute distress.. Vitals Time Taken: 1:56 PM, Height: 74 in, Weight: 250 lbs, BMI: 32.1, Temperature: 98 F, Pulse: 94 bpm, Respiratory Rate: 18 breaths/min, Blood Pressure: 113/80 mmHg, Capillary Blood Glucose: 160 mg/dl. General Notes: glucose per pt report this am Respiratory Normal work of breathing on room air.. General Notes: 05/04/2022: The wound is about half the size as it was last week. The periwound skin is intact. The wound itself is very clean without any accumulated slough. There is granulation tissue on the surface. Integumentary (Hair, Skin) Wound #1 status is Open. Original cause of  wound was Blister. The date acquired was: 03/09/2022. The wound has been in treatment 4 weeks. The wound is located on the Left,Lateral Lower Leg. The wound measures 0.7cm length x 0.4cm width x 0.1cm depth; 0.22cm^2 area and 0.022cm^3 volume. There is Fat Layer (Subcutaneous Tissue) exposed. There is no tunneling or undermining noted. There is a medium amount of serosanguineous drainage noted. The wound margin is distinct with the outline attached to the wound base. There is medium (34-66%) pink granulation within the wound bed. There is a medium (34-66%)  amount of necrotic tissue within the wound bed including Adherent Slough. 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 There was a Three Layer Compression Therapy Procedure by Baruch Gouty, RN. Post procedure Diagnosis Wound #1: Same as Pre-Procedure Plan Follow-up Appointments: Return Appointment in 1 week. - Dr. Celine Ahr - Room 1 Tuesday 7/18 @ 07:30 am Bathing/ Shower/ Hygiene: May shower with protection but do not get wound dressing(s) wet. - Ok to use Market researcher, can buy at [facility], [facility], or [employer] Edema Control - Lymphedema / SCD / Other: Elevate legs to the level of the heart or above for 30 minutes daily and/or when sitting, a frequency of: - throughout the day Avoid standing for long periods of time. Exercise regularly Compression stocking or Garment 20-30 mm/Hg pressure to: - right leg daily WOUND #1: - Lower Leg Wound Laterality: Left, Lateral Cleanser: Soap and Water 1 x Per Week/ Discharge Instructions: May shower and wash wound with dial  antibacterial soap and water prior to dressing change. Cleanser: Wound Cleanser 1 x Per Week/ Discharge Instructions: Cleanse the wound with wound cleanser prior to applying a clean dressing using gauze sponges, not tissue or cotton balls. Peri-Wound Care: Triamcinolone 15 (g) 1 x Per Week/ Discharge Instructions: Use triamcinolone 15 (g) as directed Peri-Wound Care: Sween Lotion (Moisturizing lotion) 1 x Per Week/ Discharge Instructions: Apply moisturizing lotion as directed Prim Dressing: KerraCel Ag Gelling Fiber Dressing, 2x2 in (silver alginate) 1 x Per Week/ ary Discharge Instructions: Apply silver alginate to wound bed as instructed Secondary Dressing: Woven Gauze Sponge, Non-Sterile 4x4 in 1 x Per Week/ Discharge Instructions: Apply over primary dressing as directed. Com pression Wrap: ThreePress (3 layer compression wrap) 1 x Per Week/ Discharge Instructions: Apply three layer compression as directed. 05/04/2022: The wound is about half the size as it was last week. The periwound skin is intact. The wound itself is very clean without any accumulated slough. There is granulation tissue on the surface. No debridement was necessary today. We will continue using silver alginate to the wound with 3 layer compression. I anticipate that he will be closed within the next few weeks. Follow-up in 1 week's time. Electronic Signature(s) Signed: 05/04/2022 2:52:47 PM By: Fredirick Maudlin MD FACS Entered By: Fredirick Maudlin on 05/04/2022 14:52:46 -------------------------------------------------------------------------------- HxROS Details Patient Name: Date of Service: Kasandra Knudsen 05/04/2022 1:15 PM Medical Record Number: [id number] Patient Account Number: [id number] Date of Birth/Sex: Treating RN: April 27, 1986 (36 y.o. [name] Primary Care Provider: PA Haig Prophet, [place] Other Clinician: Referring Provider: Treating Provider/Extender: Lenell Antu in  Treatment: 4 Information Obtained From Patient Cardiovascular Medical History: Positive for: Congestive Heart Failure; Hypertension Past Medical History Notes: Mitral and tricuspid valve regurgitation Endocrine Medical History: Positive for: Type II Diabetes Treated with: Oral agents Blood sugar tested every day: Yes Tested : Genitourinary Medical History: Past Medical History Notes: CKD-3 Neurologic Medical History: Positive for: Neuropathy Immunizations Pneumococcal Vaccine: Received Pneumococcal Vaccination: No Implantable Devices None Family and Social History Cancer: Yes - Maternal Grandparents; Diabetes: Yes - Mother,Maternal Grandparents; Hypertension: Yes - Mother; Stroke: Yes - Mother; Never smoker; Marital Status - Married; Alcohol Use: Never; Drug Use: No History; Caffeine  Use: Rarely; Financial Concerns: No; Food, Clothing or Shelter Needs: No; Support System Lacking: No; Transportation Concerns: No Psychologist, prison and probation services) Signed: 05/04/2022 4:27:02 PM By: Duanne Guess MD FACS Signed: 05/04/2022 6:01:19 PM By: Zenaida Deed RN, BSN Signed: 05/04/2022 6:01:19 PM By: Zenaida Deed RN, BSN Entered By: Duanne Guess on 05/04/2022 14:50:00 -------------------------------------------------------------------------------- SuperBill Details Patient Name: Date of Service: [name] 05/04/2022 Medical Record Number: [id number] Patient Account Number: [id number] Date of Birth/Sex: Treating RN: [birth year] (36 y.o. [name] Primary Care Provider: PA Zenovia Jordan, [place] Other Clinician: Referring Provider: Treating Provider/Extender: Elveria Rising in Treatment: 4 Diagnosis Coding ICD-10 Codes Code Description [phone] Non-pressure chronic ulcer of left calf limited to breakdown of skin I50.22 Chronic systolic (congestive) heart failure E11.65 Type 2 diabetes mellitus with hyperglycemia E11.42 Type 2 diabetes mellitus with  diabetic polyneuropathy E11.59 Type 2 diabetes mellitus with other circulatory complications Facility Procedures CPT4 Code: [id number] Description: (Facility Use Only) [phone] - APPLY MULTLAY COMPRS LWR LT LEG Modifier: Quantity: 1 Physician Procedures : CPT4 Code Description Modifier [id number] - WC PHYS LEVEL 3 - EST PT ICD-10 Diagnosis Description L97.221 Non-pressure chronic ulcer of left calf limited to breakdown of skin I50.22 Chronic systolic (congestive) heart failure E11.65 Type 2 diabetes  mellitus with hyperglycemia E11.59 Type 2 diabetes mellitus with other circulatory complications Quantity: 1 Electronic Signature(s) Signed: 05/04/2022 2:53:09 PM By: Duanne Guess MD FACS Entered By: Duanne Guess on 05/04/2022 14:53:09

## 2022-05-11 ENCOUNTER — Encounter (HOSPITAL_BASED_OUTPATIENT_CLINIC_OR_DEPARTMENT_OTHER): Payer: 59 | Admitting: General Surgery

## 2022-05-11 DIAGNOSIS — E11622 Type 2 diabetes mellitus with other skin ulcer: Secondary | ICD-10-CM | POA: Diagnosis not present

## 2022-05-11 NOTE — Progress Notes (Addendum)
BIRCH, FARINO ([id number]) Visit Report for 05/11/2022 Chief Complaint Document Details Patient Name: Date of Service: FOCH, ROSENWALD 05/11/2022 7:30 A M Medical Record Number: [id number] Patient Account Number: [id number] Date of Birth/Sex: Treating RN: 1985-11-21 (36 y.o. Jeffery Villa Primary Care Provider: PA Zenovia Jordan, [place] Other Clinician: Referring Provider: Treating Provider/Extender: Elveria Rising in Treatment: 5 Information Obtained from: Patient Chief Complaint Patient seen for complaints of Non-Healing Wound. Electronic Signature(s) Signed: 05/11/2022 8:02:11 AM By: Duanne Guess MD FACS Entered By: Duanne Guess on 05/11/2022 08:02:11 -------------------------------------------------------------------------------- HPI Details Patient Name: Date of Service: Jeffery Villa. 05/11/2022 7:30 A M Medical Record Number: [id number] Patient Account Number: [id number] Date of Birth/Sex: Treating RN: 1986/04/28 (36 y.o. Jeffery Villa Primary Care Provider: PA TIENT, [place] Other Clinician: Referring Provider: Treating Provider/Extender: Elveria Rising in Treatment: 5 History of Present Illness HPI Description: ADMISSION 04/06/2022 This is a 36 year old man with type 2 diabetes mellitus (last A1c 7.3% on Mar 06, 2022), severe congestive heart failure with an ejection fraction of around 30%, diabetic nephropathy, hypertension, and recent hospital admission for sepsis due to cellulitis and fluid overload. He was discharged from the hospital and then subsequently developed a blister on his left lateral calf. This opened and as result he was referred to wound care. He has just been keeping a dry dressing on the site. It is fairly small and has a thin layer of dried eschar over the surface. There is no odor or drainage. He does have 2+ nonpitting edema to the bilateral lower extremities. He says that he has worn  compression stockings in the past, but they are 15-20 mmHg pressure and are over 6 months old. 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 ABI in clinic today was noncompressible but he has palpable pedal pulses. 04/13/2022: The large area that was previously a blister has epithelialized. It is still very thin and friable skin, however. The slightly distal wound has some slough accumulation. The patient says that he has been very itchy since his last visit. 04/20/2022: We used triamcinolone last week which has helped with his itching. The distal wound has some slough accumulation, but is a bit smaller than last week. Edema control is good. 04/28/2022: The wound continues to contract. The periwound skin is in better condition this week. Triamcinolone continues to aid in the itching. No concern for infection. 05/04/2022: The wound is about half the size as it was last week. The periwound skin is intact. The wound itself is very clean without any accumulated slough. There is granulation tissue on the surface. 05/12/2022: The wound is healed. Electronic Signature(s) Signed: 05/11/2022 8:02:33 AM By: Duanne Guess MD FACS Entered By: Duanne Guess on 05/11/2022 08:02:33 -------------------------------------------------------------------------------- Physical Exam Details Patient Name: Date of Service: Jeffery Villa. 05/11/2022 7:30 A M Medical Record Number: [id number] Patient Account Number: [id number] Date of Birth/Sex: Treating RN: [birth year] (36 y.o. Jeffery Villa Primary Care Provider: PA Zenovia Jordan, [place] Other Clinician: Referring Provider: Treating Provider/Extender: Elveria Rising in Treatment: 5 Constitutional . Slightly tachycardic, asymptomatic. . . No acute distress.Marland Kitchen Respiratory Normal work of breathing on room air.. Notes 05/12/2022: The wound is healed. Electronic Signature(s) Signed: 05/11/2022 8:03:04 AM By: Duanne Guess MD FACS Entered By: Duanne Guess on 05/11/2022 08:03:04 -------------------------------------------------------------------------------- Physician Orders Details Patient Name: Date of Service: Jeffery Villa 05/11/2022 7:30 A M Medical Record Number: [id number]  Patient Account Number: [id number] Date of Birth/Sex: Treating RN: 10-Feb-1986 (36 y.o. Jeffery Villa Primary Care Provider: PA Zenovia Jordan, [place] Other Clinician: Referring Provider: Treating Provider/Extender: Elveria Rising in Treatment: 5 Verbal / Phone Orders: No Diagnosis Coding ICD-10 Coding Code Description [phone] Non-pressure chronic ulcer of left calf limited to breakdown of skin I50.22 Chronic systolic (congestive) heart failure E11.65 Type 2 diabetes mellitus with hyperglycemia E11.42 Type 2 diabetes mellitus with diabetic polyneuropathy E11.59 Type 2 diabetes mellitus with other circulatory complications Discharge From [facility] Services Discharge from Wound Care Center Bathing/ Shower/ Hygiene May shower and wash wound with soap and water. Edema Control - Lymphedema / SCD / Other Elevate legs to the level of the heart or above for 30 minutes daily and/or when sitting, a frequency of: - throughout the day Avoid standing for long periods of time. Exercise regularly Moisturize legs daily. Compression stocking or Garment 20-30 mm/Hg pressure to: - both legs daily Electronic Signature(s) Signed: 05/11/2022 8:05:40 AM By: Duanne Guess MD FACS Signed: 05/11/2022 8:05:40 AM By: Duanne Guess MD FACS Entered By: Duanne Guess on 05/11/2022 08:05:39 -------------------------------------------------------------------------------- Problem List Details Patient Name: Date of Service: Jeffery Villa. 05/11/2022 7:30 A M Medical Record Number: [id number] Patient Account Number: [id number] Date of Birth/Sex: Treating RN: November 04, 1985 (36 y.o. Jeffery Villa Primary Care  Provider: PA Zenovia Jordan, [place] Other Clinician: Referring Provider: Treating Provider/Extender: Elveria Rising in Treatment: 5 Active Problems ICD-10 Encounter Code Description Active Date MDM Diagnosis L97.221 Non-pressure chronic ulcer of left calf limited to breakdown of skin 04/06/2022 No Yes I50.22 Chronic systolic (congestive) heart failure 04/06/2022 No Yes E11.65 Type 2 diabetes mellitus with hyperglycemia 04/06/2022 No Yes E11.42 Type 2 diabetes mellitus with diabetic polyneuropathy 04/06/2022 No Yes E11.59 Type 2 diabetes mellitus with other circulatory complications 04/06/2022 No Yes Inactive Problems ICD-10 Code Description Active Date Inactive Date L03.90 Cellulitis, unspecified 04/06/2022 04/06/2022 Resolved Problems Electronic Signature(s) Signed: 05/11/2022 8:01:05 AM By: Duanne Guess MD FACS Entered By: Duanne Guess on 05/11/2022 08:01:04 -------------------------------------------------------------------------------- Progress Note Details Patient Name: Date of Service: Jeffery Villa. 05/11/2022 7:30 A M Medical Record Number: [id number] Patient Account Number: [id number] Date of Birth/Sex: Treating RN: [birth year] (36 y.o. Jeffery Villa Primary Care Provider: PA Zenovia Jordan, [place] Other Clinician: Referring Provider: Treating Provider/Extender: Elveria Rising in Treatment: 5 Subjective Chief Complaint Information obtained from Patient Patient seen for complaints of Non-Healing Wound. History of Present Illness (HPI) ADMISSION 04/06/2022 This is a 36 year old man with type 2 diabetes mellitus (last A1c 7.3% on Mar 06, 2022), severe congestive heart failure with an ejection fraction of around 30%, diabetic nephropathy, hypertension, and recent hospital admission for sepsis due to cellulitis and fluid overload. He was discharged from the hospital and then subsequently developed a blister on his left lateral calf.  This opened and as result he was referred to wound care. He has just been keeping a dry dressing on the site. It is fairly small and has a thin layer of dried eschar over the surface. There is no odor or drainage. He does have 2+ nonpitting edema to the bilateral lower extremities. He says that he has worn compression stockings in the past, but they are 15-20 mmHg pressure and are over 6 months old. 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 ABI in clinic today was noncompressible but he has palpable pedal pulses. 04/13/2022: The large area that was previously  a blister has epithelialized. It is still very thin and friable skin, however. The slightly distal wound has some slough accumulation. The patient says that he has been very itchy since his last visit. 04/20/2022: We used triamcinolone last week which has helped with his itching. The distal wound has some slough accumulation, but is a bit smaller than last week. Edema control is good. 04/28/2022: The wound continues to contract. The periwound skin is in better condition this week. Triamcinolone continues to aid in the itching. No concern for infection. 05/04/2022: The wound is about half the size as it was last week. The periwound skin is intact. The wound itself is very clean without any accumulated slough. There is granulation tissue on the surface. 05/12/2022: The wound is healed. Patient History Information obtained from Patient. Family History Cancer - Maternal Grandparents, Diabetes - Mother,Maternal Grandparents, Hypertension - Mother, Stroke - Mother. Social History Never smoker, Marital Status - Married, Alcohol Use - Never, Drug Use - No History, Caffeine Use - Rarely. Medical History Cardiovascular Patient has history of Congestive Heart Failure, Hypertension Endocrine Patient has history of Type II Diabetes Neurologic Patient has history of Neuropathy Medical A Surgical History  Notes nd Cardiovascular Mitral and tricuspid valve regurgitation Genitourinary CKD-3 Objective Constitutional Slightly tachycardic, asymptomatic. No acute distress.. Vitals Time Taken: 7:43 AM, Height: 74 in, Weight: 250 lbs, BMI: 32.1, Temperature: 97.5 F, Pulse: 103 bpm, Respiratory Rate: 18 breaths/min, Blood Pressure: 125/94 mmHg, Capillary Blood Glucose: 147 mg/dl. General Notes: glucose per pt report this am Respiratory Normal work of breathing on room air.. General Notes: 05/12/2022: The wound is healed. Integumentary (Hair, Skin) Wound #1 status is Open. Original cause of wound was Blister. The date acquired was: 03/09/2022. The wound has been in treatment 5 weeks. The wound is located on the Left,Lateral Lower Leg. The wound measures 0cm length x 0cm width x 0cm depth; 0cm^2 area and 0cm^3 volume. There is no tunneling or undermining noted. There is a none present amount of drainage noted. There is no granulation within the wound bed. There is no necrotic tissue within the wound bed. 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 Edema Control - Lymphedema / SCD / Other: Elevate legs to the level of the heart or above for 30 minutes daily and/or when sitting, a frequency of: - throughout the day Avoid standing for long periods of time. Exercise regularly Moisturize legs daily. Compression stocking or Garment 20-30 mm/Hg pressure to: - both legs daily 05/12/2022: His wound is healed. We will discharge him from the wound care center. He was reminded to wear compression stockings  every day throughout the day and to try and elevate his legs is much as possible. He will see [place] on an as-needed basis. Electronic Signature(s) Signed: 05/11/2022 8:06:32 AM By: Fredirick Maudlin MD FACS Entered By: Fredirick Maudlin on 05/11/2022 08:06:31 -------------------------------------------------------------------------------- HxROS Details Patient Name: Date of Service: [name] 05/11/2022 7:30 A M Medical Record Number: [id number] Patient Account Number: [id number] Date of Birth/Sex: Treating RN: 02/16/1986 (36 y.o. [name] Primary Care Provider: PA Haig Prophet, [place] Other Clinician: Referring Provider: Treating Provider/Extender: Lenell Antu in Treatment: 5  Information Obtained From Patient Cardiovascular Medical History: Positive for: Congestive Heart Failure; Hypertension Past Medical History Notes: Mitral and tricuspid valve regurgitation Endocrine Medical History: Positive for: Type II Diabetes Treated with: Oral agents Blood sugar tested every day: Yes Tested : Genitourinary Medical History: Past Medical History Notes: CKD-3 Neurologic Medical History: Positive for: Neuropathy Immunizations Pneumococcal Vaccine: Received Pneumococcal Vaccination: No Implantable Devices None Family and Social History Cancer: Yes - Maternal Grandparents; Diabetes: Yes - Mother,Maternal Grandparents; Hypertension: Yes - Mother; Stroke: Yes - Mother; Never smoker; Marital Status - Married; Alcohol Use: Never; Drug Use: No History; Caffeine Use: Rarely; Financial Concerns: No; Food, Clothing or Shelter Needs: No; Support System Lacking: No; Transportation Concerns: No Electronic Signature(s) Signed: 05/11/2022 10:08:55 AM By: Fredirick Maudlin MD FACS Signed: 05/11/2022 5:31:58 PM By: Baruch Gouty RN, BSN Entered By: Fredirick Maudlin on 05/11/2022  08:02:37 -------------------------------------------------------------------------------- SuperBill Details Patient Name: Date of Service: Kasandra Knudsen 05/11/2022 Medical Record Number: [id number] Patient Account Number: [id number] Date of Birth/Sex: Treating RN: 1986-10-23 (36 y.o. [name] Primary Care Provider: PA Haig Prophet, [place] Other Clinician: Referring Provider: Treating Provider/Extender: Lenell Antu in Treatment: 5 Diagnosis Coding ICD-10 Codes Code Description [phone] Non-pressure chronic ulcer of left calf limited to breakdown of skin [id number] Chronic systolic (congestive) heart failure E11.65 Type 2 diabetes mellitus with hyperglycemia E11.42 Type 2 diabetes mellitus with diabetic polyneuropathy E11.59 Type 2 diabetes mellitus with other circulatory complications Facility Procedures CPT4 Code: [id number] Description: 99213 - WOUND CARE VISIT-LEV 3 EST PT Modifier: Quantity: 1 Physician Procedures : CPT4 Code Description Modifier [id number] - WC PHYS LEVEL 3 - EST PT ICD-10 Diagnosis Description L97.221 Non-pressure chronic ulcer of left calf limited to breakdown of skin [id number] Chronic systolic (congestive) heart failure E11.65 Type 2 diabetes  mellitus with hyperglycemia E11.59 Type 2 diabetes mellitus with other circulatory complications Quantity: 1 Electronic Signature(s) Signed: 05/11/2022 8:06:45 AM By: Fredirick Maudlin MD FACS Entered By: Fredirick Maudlin on 05/11/2022 08:06:45

## 2022-05-11 NOTE — Progress Notes (Signed)
Jeffery Villa ([id number]) Visit Report for 05/11/2022 Arrival Information Details Patient Name: Date of Service: Jeffery Villa, Jeffery Villa 05/11/2022 7:30 A M Medical Record Number: [id number] Patient Account Number: [id number] Date of Birth/Sex: Treating RN: 03-04-1986 (36 y.o. Bayard Hugger, Bonita Quin Primary Care Albin Duckett: PA Zenovia Jordan, [place] Other Clinician: Referring Laquashia Mergenthaler: Treating Madeleyn Schwimmer/Extender: Elveria Rising in Treatment: 5 Visit Information History Since Last Visit Added or deleted any medications: No Patient Arrived: Ambulatory Any new allergies or adverse reactions: No Arrival Time: 07:42 Had a fall or experienced change in No Accompanied By: self activities of daily living that may affect Transfer Assistance: None risk of falls: Patient Identification Verified: Yes Signs or symptoms of abuse/neglect since last visito No Secondary Verification Process Completed: Yes Hospitalized since last visit: No Patient Requires Transmission-Based Precautions: No Implantable device outside of the clinic excluding No Patient Has Alerts: Yes cellular tissue based products placed in the center Patient Alerts: L ABI non compressible since last visit: Has Dressing in Place as Prescribed: Yes Has Compression in Place as Prescribed: Yes Pain Present Now: No Electronic Signature(s) Signed: 05/11/2022 5:31:58 PM By: Zenaida Deed RN, BSN Entered By: Zenaida Deed on 05/11/2022 07:43:33 -------------------------------------------------------------------------------- Clinic Level of Care Assessment Details Patient Name: Date of Service: Jeffery Arch. 05/11/2022 7:30 A M Medical Record Number: [id number] Patient Account Number: [id number] Date of Birth/Sex: Treating RN: August 23, 1986 (36 y.o. Jeffery Villa Primary Care Brielynn Sekula: PA Zenovia Jordan, [place] Other Clinician: Referring Jakob Kimberlin: Treating Carlia Bomkamp/Extender: Elveria Rising in  Treatment: 5 Clinic Level of Care Assessment Items TOOL 4 Quantity Score []  - 0 Use when only an EandM is performed on FOLLOW-UP visit ASSESSMENTS - Nursing Assessment / Reassessment X- 1 10 Reassessment of Co-morbidities (includes updates in patient status) X- 1 5 Reassessment of Adherence to Treatment Plan ASSESSMENTS - Wound and Skin A ssessment / Reassessment X - Simple Wound Assessment / Reassessment - one wound 1 5 []  - 0 Complex Wound Assessment / Reassessment - multiple wounds []  - 0 Dermatologic / Skin Assessment (not related to wound area) ASSESSMENTS - Focused Assessment X- 1 5 Circumferential Edema Measurements - multi extremities []  - 0 Nutritional Assessment / Counseling / Intervention X- 1 5 Lower Extremity Assessment (monofilament, tuning fork, pulses) []  - 0 Peripheral Arterial Disease Assessment (using hand held doppler) ASSESSMENTS - Ostomy and/or Continence Assessment and Care []  - 0 Incontinence Assessment and Management []  - 0 Ostomy Care Assessment and Management (repouching, etc.) PROCESS - Coordination of Care X - Simple Patient / Family Education for ongoing care 1 15 []  - 0 Complex (extensive) Patient / Family Education for ongoing care X- 1 10 Staff obtains , Records, T Results / Process Orders est []  - 0 Staff telephones HHA, Nursing Homes / Clarify orders / etc []  - 0 Routine Transfer to another Facility (non-emergent condition) []  - 0 Routine Hospital Admission (non-emergent condition) []  - 0 New Admissions / / Ordering NPWT Apligraf, etc. , []  - 0 Emergency Hospital Admission (emergent condition) X- 1 10 Simple Discharge Coordination []  - 0 Complex (extensive) Discharge Coordination PROCESS - Special Needs []  - 0 Pediatric / Minor Patient Management []  - 0 Isolation Patient Management []  - 0 Hearing / Language / Visual special needs []  - 0 Assessment of Community assistance (transportation,  D/C planning, etc.) []  - 0 Additional assistance / Altered mentation []  - 0 Support Surface(s) Assessment (bed, cushion, seat, etc.) INTERVENTIONS - Wound Cleansing / Measurement X -  Simple Wound Cleansing - one wound 1 5 []  - 0 Complex Wound Cleansing - multiple wounds X- 1 5 Wound Imaging (photographs - any number of wounds) []  - 0 Wound Tracing (instead of photographs) []  - 0 Simple Wound Measurement - one wound []  - 0 Complex Wound Measurement - multiple wounds INTERVENTIONS - Wound Dressings X - Small Wound Dressing one or multiple wounds 1 10 []  - 0 Medium Wound Dressing one or multiple wounds []  - 0 Large Wound Dressing one or multiple wounds []  - 0 Application of Medications - topical []  - 0 Application of Medications - injection INTERVENTIONS - Miscellaneous []  - 0 External ear exam []  - 0 Specimen Collection (cultures, biopsies, blood, body fluids, etc.) []  - 0 Specimen(s) / Culture(s) sent or taken to Lab for analysis []  - 0 Patient Transfer (multiple staff / Civil Service fast streamer / Similar devices) []  - 0 Simple Staple / Suture removal (25 or less) []  - 0 Complex Staple / Suture removal (26 or more) []  - 0 Hypo / Hyperglycemic Management (close monitor of Blood Glucose) []  - 0 Ankle / Brachial Index (ABI) - do not check if billed separately X- 1 5 Vital Signs Has the patient been seen at the hospital within the last three years: Yes Total Score: 90 Level Of Care: New/Established - Level 3 Electronic Signature(s) Signed: 05/11/2022 5:31:58 PM By: Baruch Gouty RN, BSN Entered By: Baruch Gouty on 05/11/2022 08:04:26 -------------------------------------------------------------------------------- Encounter Discharge Information Details Patient Name: Date of Service: Jeffery Villa. 05/11/2022 7:30 A M Medical Record Number: [id number] Patient Account Number: [id number] Date of Birth/Sex: Treating RN: 21-Nov-1985 (36 y.o. Jeffery Villa Primary Care  Chantel Teti: PA Haig Prophet, [place] Other Clinician: Referring Khyre Germond: Treating Godwin Tedesco/Extender: Lenell Antu in Treatment: 5 Encounter Discharge Information Items Discharge Condition: Stable Ambulatory Status: Ambulatory Discharge Destination: Home Transportation: Private Auto Accompanied By: self Schedule Follow-up Appointment: Yes Clinical Summary of Care: Patient Declined Electronic Signature(s) Signed: 05/11/2022 5:31:58 PM By: Baruch Gouty RN, BSN Entered By: Baruch Gouty on 05/11/2022 08:05:05 -------------------------------------------------------------------------------- Lower Extremity Assessment Details Patient Name: Date of Service: Jeffery Villa. 05/11/2022 7:30 A M Medical Record Number: [id number] Patient Account Number: [id number] Date of Birth/Sex: Treating RN: 1986/06/13 (36 y.o. Jeffery Villa Primary Care Bertis Hustead: PA Haig Prophet, [place] Other Clinician: Referring Kroy Sprung: Treating Osman Calzadilla/Extender: Lenell Antu in Treatment: 5 Edema Assessment Assessed: [Left: No] [Right: No] Edema: [Left: Ye] [Right: s] Calf Left: Right: Point of Measurement: 35 cm From Medial Instep 37.5 cm Ankle Left: Right: Point of Measurement: 9 cm From Medial Instep 25 cm Vascular Assessment Pulses: Dorsalis Pedis Palpable: [Left:Yes] Electronic Signature(s) Signed: 05/11/2022 5:31:58 PM By: Baruch Gouty RN, BSN Entered By: Baruch Gouty on 05/11/2022 07:48:13 -------------------------------------------------------------------------------- Multi Wound Chart Details Patient Name: Date of Service: Jeffery Villa. 05/11/2022 7:30 A M Medical Record Number: [id number] Patient Account Number: [id number] Date of Birth/Sex: Treating RN: [birth year] (36 y.o. Jeffery Villa Primary Care Kieran Arreguin: PA Haig Prophet, [place] Other Clinician: Referring Larissa Pegg: Treating Ollie Delano/Extender: Lenell Antu in  Treatment: 5 Vital Signs Height(in): 74 Capillary Blood Glucose(mg/dl): 147 Weight(lbs): 250 Pulse(bpm): 103 Body Mass Index(BMI): 32.1 Blood Pressure(mmHg): 125/94 Temperature(F): 97.5 Respiratory Rate(breaths/min): 18 Photos: [N/A:N/A] Left, Lateral Lower Leg N/A N/A Wound Location: Blister N/A N/A Wounding Event: Venous Leg Ulcer N/A N/A Primary Etiology: Congestive Heart Failure, N/A N/A Comorbid History: Hypertension, Type II Diabetes, Neuropathy 03/09/2022 N/A N/A Date Acquired: 5 N/A N/A Weeks of Treatment: Open  N/A N/A Wound Status: No N/A N/A Wound Recurrence: 0x0x0 N/A N/A Measurements L x W x D (cm) 0 N/A N/A A (cm) : rea 0 N/A N/A Volume (cm) : 100.00% N/A N/A % Reduction in Area: 100.00% N/A N/A % Reduction in Volume: Full Thickness Without Exposed N/A N/A Classification: Support Structures None Present N/A N/A Exudate Amount: None Present (0%) N/A N/A Granulation Amount: None Present (0%) N/A N/A Necrotic Amount: Fascia: No N/A N/A Exposed Structures: Fat Layer (Subcutaneous Tissue): No Tendon: No Muscle: No Joint: No Bone: No Large (67-100%) N/A N/A Epithelialization: Treatment Notes Electronic Signature(s) Signed: 05/11/2022 8:01:41 AM By: Fredirick Maudlin MD FACS Signed: 05/11/2022 5:31:58 PM By: Baruch Gouty RN, BSN Entered By: Fredirick Maudlin on 05/11/2022 08:01:41 -------------------------------------------------------------------------------- Multi-Disciplinary Care Plan Details Patient Name: Date of Service: Jeffery Villa. 05/11/2022 7:30 A M Medical Record Number: [id number] Patient Account Number: [id number] Date of Birth/Sex: Treating RN: [birth year] (36 y.o. Jeffery Villa Primary Care Shanell Aden: PA Haig Prophet, [place] Other Clinician: Referring Tekeisha Hakim: Treating Loraine Bhullar/Extender: Lenell Antu in Treatment: [place] reviewed with physician Active  Inactive Electronic Signature(s) Signed: 05/11/2022 5:31:58 PM By: Baruch Gouty RN, BSN Entered By: Baruch Gouty on 05/11/2022 07:55:45 -------------------------------------------------------------------------------- Pain Assessment Details Patient Name: Date of Service: Jeffery Villa 05/11/2022 7:30 A M Medical Record Number: [id number] Patient Account Number: [id number] Date of Birth/Sex: Treating RN: Jun 25, 1986 (36 y.o. Jeffery Villa Primary Care Talmage Teaster: PA Haig Prophet, [place] Other Clinician: Referring Ricka Westra: Treating Dearies Meikle/Extender: Lenell Antu in Treatment: 5 Active Problems Location of Pain Severity and Description of Pain Patient Has Paino No Site Locations Rate the pain. Current Pain Level: 0 Pain Management and Medication Current Pain Management: Electronic Signature(s) Signed: 05/11/2022 5:31:58 PM By: Baruch Gouty RN, BSN Entered By: Baruch Gouty on 05/11/2022 07:44:45 -------------------------------------------------------------------------------- Patient/Caregiver Education Details Patient Name: Date of Service: Jeffery Villa 7/18/2023andnbsp7:30 [place] Record Number: [id number] Patient Account Number: [id number] Date of Birth/Gender: Treating RN: May 01, 1986 (36 y.o. Jeffery Villa Primary Care Physician: PA Haig Prophet, [place] Other Clinician: Referring Physician: Treating Physician/Extender: Lenell Antu in Treatment: 5 Education Assessment Education Provided To: Patient Education Topics Provided Venous: Methods: Explain/Verbal Responses: Reinforcements needed, State content correctly Wound/Skin Impairment: Methods: Explain/Verbal Responses: Reinforcements needed, State content correctly Electronic Signature(s) Signed: 05/11/2022 5:31:58 PM By: Baruch Gouty RN, BSN Entered By: Baruch Gouty on 05/11/2022  07:56:06 -------------------------------------------------------------------------------- Wound Assessment Details Patient Name: Date of Service: Jeffery Villa. 05/11/2022 7:30 A M Medical Record Number: [id number] Patient Account Number: [id number] Date of Birth/Sex: Treating RN: [birth year] (36 y.o. Jeffery Villa Primary Care Uzma Hellmer: PA Haig Prophet, [place] Other Clinician: Referring Anaissa Macfadden: Treating Jackston Oaxaca/Extender: Lenell Antu in Treatment: 5 Wound Status Wound Number: 1 Primary Venous Leg Ulcer Etiology: Wound Location: Left, Lateral Lower Leg Wound Status: Open Wounding Event: Blister Comorbid Congestive Heart Failure, Hypertension, Type II Diabetes, Date Acquired: 03/09/2022 History: Neuropathy Weeks Of Treatment: 5 Clustered Wound: No Photos Wound Measurements Length: (cm) Width: (cm) Depth: (cm) Area: (cm) Volume: (cm) 0 % Reduction in Area: 100% 0 % Reduction in Volume: 100% 0 Epithelialization: Large (67-100%) 0 Tunneling: No 0 Undermining: No Wound Description Classification: Full Thickness Without Exposed Support Structures Exudate Amount: None Present Foul Odor After Cleansing: No Slough/Fibrino No Wound Bed Granulation Amount: None Present (0%) Exposed Structure Necrotic Amount: None Present (0%) Fascia Exposed: No Fat Layer (Subcutaneous Tissue) Exposed: No Tendon Exposed: No Muscle Exposed: No  Joint Exposed: No Bone Exposed: No Electronic Signature(s) Signed: 05/11/2022 5:31:58 PM By: Zenaida Deed RN, BSN Entered By: Zenaida Deed on 05/11/2022 07:54:53 -------------------------------------------------------------------------------- Vitals Details Patient Name: Date of Service: Jeffery Arch. 05/11/2022 7:30 A M Medical Record Number: [id number] Patient Account Number: [id number] Date of Birth/Sex: Treating RN: [birth year] (36 y.o. Jeffery Villa Primary Care Zali Kamaka: PA Zenovia Jordan, [place] Other  Clinician: Referring Audreyana Huntsberry: Treating Agness Sibrian/Extender: Elveria Rising in Treatment: 5 Vital Signs Time Taken: 07:43 Temperature (F): 97.5 Height (in): 74 Pulse (bpm): 103 Weight (lbs): 250 Respiratory Rate (breaths/min): 18 Body Mass Index (BMI): 32.1 Blood Pressure (mmHg): 125/94 Capillary Blood Glucose (mg/dl): 283 Reference Range: 80 - 120 mg / dl Notes glucose per pt report this am Electronic Signature(s) Signed: 05/11/2022 5:31:58 PM By: Zenaida Deed RN, BSN Entered By: Zenaida Deed on 05/11/2022 07:44:25

## 2022-06-14 ENCOUNTER — Other Ambulatory Visit (HOSPITAL_COMMUNITY): Payer: 59

## 2022-06-16 ENCOUNTER — Ambulatory Visit (HOSPITAL_BASED_OUTPATIENT_CLINIC_OR_DEPARTMENT_OTHER)
Admission: RE | Admit: 2022-06-16 | Discharge: 2022-06-16 | Disposition: A | Discharge status: Home / Self Care | Payer: 59 | Source: Ambulatory Visit | Attending: Cardiology | Admitting: Cardiology

## 2022-06-16 ENCOUNTER — Other Ambulatory Visit (HOSPITAL_COMMUNITY): Payer: Self-pay

## 2022-06-16 ENCOUNTER — Encounter (HOSPITAL_COMMUNITY): Payer: Self-pay | Admitting: Cardiology

## 2022-06-16 ENCOUNTER — Telehealth (HOSPITAL_COMMUNITY): Payer: Self-pay

## 2022-06-16 ENCOUNTER — Ambulatory Visit (HOSPITAL_COMMUNITY)
Admission: RE | Admit: 2022-06-16 | Discharge: 2022-06-16 | Disposition: A | Discharge status: Home / Self Care | Payer: 59 | Source: Ambulatory Visit | Attending: Cardiology | Admitting: Cardiology

## 2022-06-16 VITALS — BP 120/82 | HR 91 | Wt 243.2 lb

## 2022-06-16 DIAGNOSIS — I428 Other cardiomyopathies: Secondary | ICD-10-CM | POA: Diagnosis not present

## 2022-06-16 DIAGNOSIS — I081 Rheumatic disorders of both mitral and tricuspid valves: Secondary | ICD-10-CM | POA: Insufficient documentation

## 2022-06-16 DIAGNOSIS — R0602 Shortness of breath: Secondary | ICD-10-CM | POA: Insufficient documentation

## 2022-06-16 DIAGNOSIS — Z7984 Long term (current) use of oral hypoglycemic drugs: Secondary | ICD-10-CM | POA: Insufficient documentation

## 2022-06-16 DIAGNOSIS — Z833 Family history of diabetes mellitus: Secondary | ICD-10-CM | POA: Insufficient documentation

## 2022-06-16 DIAGNOSIS — I5022 Chronic systolic (congestive) heart failure: Secondary | ICD-10-CM | POA: Insufficient documentation

## 2022-06-16 DIAGNOSIS — N1832 Chronic kidney disease, stage 3b: Secondary | ICD-10-CM | POA: Insufficient documentation

## 2022-06-16 DIAGNOSIS — Z79899 Other long term (current) drug therapy: Secondary | ICD-10-CM | POA: Insufficient documentation

## 2022-06-16 DIAGNOSIS — G4733 Obstructive sleep apnea (adult) (pediatric): Secondary | ICD-10-CM | POA: Insufficient documentation

## 2022-06-16 DIAGNOSIS — E1122 Type 2 diabetes mellitus with diabetic chronic kidney disease: Secondary | ICD-10-CM | POA: Diagnosis not present

## 2022-06-16 DIAGNOSIS — I340 Nonrheumatic mitral (valve) insufficiency: Secondary | ICD-10-CM

## 2022-06-16 DIAGNOSIS — I252 Old myocardial infarction: Secondary | ICD-10-CM | POA: Diagnosis not present

## 2022-06-16 DIAGNOSIS — Z8249 Family history of ischemic heart disease and other diseases of the circulatory system: Secondary | ICD-10-CM | POA: Diagnosis not present

## 2022-06-16 DIAGNOSIS — E785 Hyperlipidemia, unspecified: Secondary | ICD-10-CM | POA: Diagnosis not present

## 2022-06-16 DIAGNOSIS — I130 Hypertensive heart and chronic kidney disease with heart failure and stage 1 through stage 4 chronic kidney disease, or unspecified chronic kidney disease: Secondary | ICD-10-CM | POA: Diagnosis not present

## 2022-06-16 DIAGNOSIS — I2510 Atherosclerotic heart disease of native coronary artery without angina pectoris: Secondary | ICD-10-CM | POA: Diagnosis not present

## 2022-06-16 DIAGNOSIS — Z89429 Acquired absence of other toe(s), unspecified side: Secondary | ICD-10-CM | POA: Insufficient documentation

## 2022-06-16 LAB — BASIC METABOLIC PANEL
Anion gap: 7 (ref 5–15)
BUN: 65 mg/dL — ABNORMAL HIGH (ref 6–20)
CO2: 25 mmol/L (ref 22–32)
Calcium: 9.4 mg/dL (ref 8.9–10.3)
Chloride: 107 mmol/L (ref 98–111)
Creatinine, Ser: 2.51 mg/dL — ABNORMAL HIGH (ref 0.61–1.24)
GFR, Estimated: 34 mL/min — ABNORMAL LOW (ref >60)
Glucose, Bld: 156 mg/dL — ABNORMAL HIGH (ref 70–99)
Potassium: 4.2 mmol/L (ref 3.5–5.1)
Sodium: 139 mmol/L (ref 135–145)

## 2022-06-16 LAB — ECHOCARDIOGRAM COMPLETE
Area-P 1/2: 5.21 cm2
MV M vel: 4.34 m/s
MV Peak grad: 75.3 mmHg
Radius: 0.8 cm
S' Lateral: 6.2 cm

## 2022-06-16 LAB — BRAIN NATRIURETIC PEPTIDE: B Natriuretic Peptide: 2658 pg/mL — ABNORMAL HIGH (ref 0.0–100.0)

## 2022-06-16 MED ORDER — TORSEMIDE 20 MG PO TABS: ORAL_TABLET | ORAL | 3 refills | Status: DC

## 2022-06-16 MED ORDER — ISOSORBIDE MONONITRATE ER 60 MG PO TB24: 60.0000 mg | ORAL_TABLET | Freq: Every day | ORAL | 3 refills | Status: DC

## 2022-06-16 MED ORDER — HYDRALAZINE HCL 50 MG PO TABS: 50.0000 mg | ORAL_TABLET | Freq: Three times a day (TID) | ORAL | 3 refills | Status: DC

## 2022-06-16 NOTE — Telephone Encounter (Signed)
Referral faxed to [facility] on 06/16/22 at 11.50am

## 2022-06-16 NOTE — Progress Notes (Signed)
ADVANCED [facility] CLINIC CLINIC NOTE   PCP: Joaquin [facility] HF Cardiologist: Dr. Aundra Dubin  HPI: [name] is a 36 y.o. male with past medical history of hypertension, hyperlipidemia, insulin-dependent diabetes mellitus since 36 years of age, CKD stage III, OSA and chronic systolic heart failure.    He was seen 6/22 by PCP, referred to cardiology for echo. Echo 9/22 showed EF 20 to 25%, moderate to severe MR, moderate to severe TR, RVSP 60 mmHg.  He was started on GDMT. Zio placed 10/22 showing average heart rate 92, patient remained tachycardic 8.5% of total time, 3 episodes of wide-complex tachycardia with longest run 20 beats.   He saw a cardiologist at [facility], 10/22.  It was recommended he undergo outpatient Myoview, start LifeVest, then repeat echocardiogram in 3 months.  He did not follow back up. He wore the LifeVest for about a week before he discontinued it.    Admitted 5/23 to [facility] with left lower extremity pain. WBC 30.7, felt he was also slightly volume overloaded. Diuresed with IV lasix and placed on abx. LE dopplers negative for DVT.  Echo showed EF 20-25%, global hypokinesis with moderate LV dilation, mod-severe MR (severe by PISA ERO), moderate TR.  He underwent  RHC showing mildly elevated PCWP and normal RA pressure with mild pulmonary venous hypertension, CI 3.33 (off milrinone x 1 hour). No LHC with elevated SCr. Cardiac MRI showed >50% wall thickness subendocardial LGE in the mid anteroseptal wall, looked like a coronary disease pattern suggestive of prior MI. LVEF 15%. GDMT limited by AKI. Planned for ischemic eval in future when creatinine comes down. Discharged home, weight 264 lbs.  He presented for visit with PharmD for medication titration on 06/27. He was added on for urgent APP appointment. Wt noted to be up ~20 lb since prior APP clinic visit on 6/1. ReDs Clip elevated 59%. Admitted to dietary indiscretion w/ sodium. He was given 80 mg lasix IV and started on  furoscix 80 mg daily X 2 days. Jardiance restarted.   Follow up 04/23/22, remained overloaded, ReDs 58%. Torsemide increased to 40 bid and metolazone added weekly.   Echo was done today and reviewed, EF 20% with severe LV dilation, moderate RV dilation with mildly decreased systolic function, mod-severe TR, mod-severe functional MR, PASP 64 mmHg, dilated IVC.   Today he returns for HF follow up.  Weight is up 4 lbs.  He is short of breath with heavy exertion.  No dyspnea walking up stairs, walking on flat ground.  No orthopnea/PND.  Rare atypical chest pain.  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 No ETOH/drugs.  Mother died of CHF, she was a diabetic.   Labs (7/23): K 5.0, creatinine 2.44, BNP 2297  ECG (personally reviewed): NSR, LVH with repolarization abnormality  Review of Systems: All systems reviewed and negative except as mentioned in HPI.  PMH: 1. OSA 2. Type 2 diabetes: Since age 76.  54. HTN: First noted in high school. 4. CKD stage 3: Likely due to diabetes and HTN.  5. Hyperlipidemia 6. Chronic systolic CHF: Cardiomyopathy of uncertain etiology.  - Echo (9/22): EF 20-25%, mod-severe MR.  - Echo (5/23): EF 20-25%, moderate LV dilation, mod-severe MR, moderate TR - Cardiac MRI (5/23): Technically difficult, LVEF 15% with severe LV dilation, RVEF 15% with severe RV dilation, >50% subendocardial LGE mid anteroseptal wall (?prior MI).  - RHC (5/23): mean RA 8, PA 58/27, mean PCWP 17, CI 3.33 - Echo (8/23): EF 20%  with severe LV dilation, moderate RV dilation with mildly decreased systolic function, mod-severe TR, mod-severe functional MR, PASP 64 mmHg, dilated IVC.  7. H/o diabetic foot with toe amputation.    Current Outpatient Medications  Medication Sig Dispense Refill   acetaminophen (TYLENOL) 325 MG tablet Take 2 tablets (650 mg total) by mouth every 6 (six) hours as needed for mild pain (or Fever >/= 101).     aspirin 81 MG EC tablet Take 1 tablet (81 mg total)  by mouth daily. 30 tablet 0   atorvastatin (LIPITOR) 40 MG tablet Take 1 tablet (40 mg total) by mouth daily. 90 tablet 3   blood glucose meter kit and supplies Dispense based on patient and insurance preference. Use up to four times daily as directed. (FOR ICD-10 E10.9, E11.9). 1 each 0   carvedilol (COREG) 12.5 MG tablet Take 1.5 tablets (18.75 mg total) by mouth 2 (two) times daily. 90 tablet 3   dapagliflozin propanediol (FARXIGA) 10 MG TABS tablet Take 1 tablet (10 mg total) by mouth daily before breakfast. 30 tablet 3   glipiZIDE (GLUCOTROL) 5 MG tablet Take 1 tablet (5 mg total) by mouth daily before breakfast. 30 tablet 0   Insulin Pen Needle 30G X 5 MM MISC New needle with every insulin adminsration 100 each 5   metolazone (ZAROXOLYN) 2.5 MG tablet Take 1 tablet (2.5 mg total) by mouth once a week. On Saturdays only 13 tablet 3   hydrALAZINE (APRESOLINE) 50 MG tablet Take 1 tablet (50 mg total) by mouth 3 (three) times daily. 180 tablet 3   isosorbide mononitrate (IMDUR) 60 MG 24 hr tablet Take 1 tablet (60 mg total) by mouth daily. 90 tablet 3   torsemide (DEMADEX) 20 MG tablet Take 4 tablets (80 mg total) by mouth in the morning AND 2 tablets (40 mg total) every evening. 360 tablet 3   No current facility-administered medications for this encounter.   No Known Allergies  Social History   Socioeconomic History   Marital status: Single    Spouse name: Not on file   Number of children: Not on file   Years of education: Not on file   Highest education level: Not on file  Occupational History   Not on file  Tobacco Use   Smoking status: Never   Smokeless tobacco: Not on file  Vaping Use   Vaping Use: Never used  Substance and Sexual Activity   Alcohol use: No    Alcohol/week: 0.0 standard drinks of alcohol   Drug use: No   Sexual activity: Not on file  Other Topics Concern   Not on file  Social History Narrative   Not on file   Social Determinants of Health    Financial Resource Strain: Not on file  Food Insecurity: Not on file  Transportation Needs: Not on file  Physical Activity: Not on file  Stress: Not on file  Social Connections: Not on file  Intimate Partner Violence: Not on file   Family History  Problem Relation Age of Onset   Diabetes Mother    Hypertension Mother    Stroke Mother    Alcohol abuse Father    Cancer Maternal Grandmother    Diabetes Maternal Grandmother    Hypertension Maternal Grandmother    His mother also had heart failure in her 65s and passed away from the heart failure.    BP 120/82   Pulse 91   Wt 110.3 kg (243 lb 3.2 oz)   SpO2  99%   BMI 31.23 kg/m   Wt Readings from Last 3 Encounters:  06/16/22 110.3 kg (243 lb 3.2 oz)  05/03/22 108.6 kg (239 lb 6.4 oz)  04/23/22 119.9 kg (264 lb 6.4 oz)   PHYSICAL EXAM: General: NAD Neck: JVP 10-12 cm, no thyromegaly or thyroid nodule.  Lungs: Clear to auscultation bilaterally with normal respiratory effort. CV: Lateral PMI.  Heart regular S1/S2, +S3, 2/6 HSM LLSB/apex.  1+ ankle edema.  No carotid bruit.  Normal pedal pulses.  Abdomen: Soft, nontender, no hepatosplenomegaly, no distention.  Skin: Intact without lesions or rashes.  Neurologic: Alert and oriented x 3.  Psych: Normal affect. Extremities: No clubbing or cyanosis.  HEENT: Normal.   ASSESSMENT & PLAN: 1. Chronic systolic CHF: Cardiomyopathy of uncertain etiology.  Echo in 9/22 with EF 20-25%, moderate-severe MR, moderate-severe TR.  Echo 05/23 EF 20-25%, global hypokinesis with moderate LV dilation, mod-severe MR (severe by PISA ERO), moderate TR.  He has had HTN since his teens, could be due to long-standing poorly controlled HTN. With DM/HTN/hyperlipidemia x years, CAD must be a consideration though no chest pain. Prior viral myocarditis is possible as well. Additionally, his mother died of CHF in her early 6s.  cMRI limited study, difficult images due to problems with breath-holding. >50%  wall thickness subendocardial LGE in the mid anteroseptal wall, looks like a coronary disease pattern suggestive of prior MI though does not seem extensive enough to cause his degree of LV dysfunction. LVEF 15% on cMRI.  Echo done today showed EF 20% with severe LV dilation, moderate RV dilation with mildly decreased systolic function, mod-severe TR, mod-severe functional MR, PASP 64 mmHg, dilated IVC.  Narrow QRS, not CRT candidate. NYHA class II symptoms but he remains significantly volume overloaded on exam. GDMT has been limited by CKD stage 3b.  - Increase torsemide to 80 qam/40 qpm with metolazone 2.5 on Saturday.  BMET/BNP today and BMET in 1 week.  - Continue carvedilol 18.75 mg bid. - Continue Farxiga 10 mg daily. - Increase hydralazine to 50 mg tid and Imdur to 60 mg daily.  - Next step will be spironolactone if creatinine and K remain stable (has been hyperkalemic in past).  - Persistently low EF, refer to EP for ICD (not CRT candidate).  - I will arrange for cardiac PET for ischemia evaluation (not cath candidate with CKD stage 3b).  - Given family history of cardiomyopathy (mother), will arrange for [facility] gene testing for common familial cardiomoypathies.  - He will need eventual CPX.   - I am concerned about his overall trajectory, volume has been difficult to manage.  He may end up needing evaluation relatively soon for heart/kidney transplant.  2. 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  - BMET today. - Refer to nephrology.  - Continue SGLT2i. 3. Type 2 diabetes: Early onset.  - Continue SGLT2i. 4. HTN: Early onset in teens. Controlled on current meds. - GDMT per above  5. Hyperlipidemia: Continue atorvastatin.  6. Diabetic foot s/p toe amputation.  7. Mitral regurgitation: Moderate to severe functional MR.  He may be a Mitraclip candidate if the LV is not too dilated.  - I will arrange for TEE to assess for Mitraclip candidacy.   Discussed risks/benefits with patient and he agrees to procedure.  8. OSA: Needs CPAP, has sleep medicine appt.  Followup in 3 wks with me.  Will need close followup.   Loralie Champagne. 06/16/2022

## 2022-06-16 NOTE — Progress Notes (Signed)
Blood collected for TTR genetic testing per Dr McLean.  Order form completed and both shipped by [employer] to [facility].  

## 2022-06-16 NOTE — Patient Instructions (Signed)
INCREASE Hydralazine to 50 mg Three times a day  Increase Imdur to 60 mg daily   Increase torsemide to 80 mg in the morning and 40 mg in the evening  Labs done today, your results will be available in MyChart, we will contact you for abnormal readings.  Genetic test has been done, this has to be sent to [place] to be processed and can take 1-2 weeks to get results back.  We will let you know the results.  Repeat blood work in a week  You are scheduled for a TEE on September 7th  with Dr. Shirlee Latch.  Please arrive at the [facility] (Main Entrance A) at [facility]: [address] [place], [place] [address] at 6.30 am. (1 hour prior to procedure )  DIET: Nothing to eat or drink after midnight except a sip of water with medications (see medication instructions below)  FYI: For your safety, and to allow [place] to monitor your vital signs accurately during the surgery/procedure we request that   if you have artificial nails, gel coating, SNS etc. Please have those removed prior to your surgery/procedure. Not having the nail coverings /polish removed may result in cancellation or delay of your surgery/procedure.   Medication Instructions: Hold Torsemide the morning of procedure   You must have a responsible person to drive you home and stay in the waiting area during your procedure. Failure to do so could result in cancellation.  Bring your insurance cards.  *Special Note: Every effort is made to have your procedure done on time. Occasionally there are emergencies that occur at the hospital that may cause delays. Please be patient if a delay does occur.   Your provider has ordered a Cardiac PET SCAN for you. ONCE APPROVED BY YOUR INSURANCE COMPANY YOU WILL BE CALLED TO ARRANGE THE TEST. How to Prepare for Your Cardiac PET/CT Stress Test:  1. Please do not take these medications before your test:   Medications that may interfere with the cardiac pharmacological stress agent (ex.  nitrates - including erectile dysfunction medications or beta-blockers) the day of the exam. (Erectile dysfunction medication should be held for at least 72 hrs prior to test) Theophylline containing medications for 12 hours. Dipyridamole 48 hours prior to the test. Your remaining medications may be taken with water.  2. Nothing to eat or drink, except water, 3 hours prior to arrival time.   NO caffeine/decaffeinated products, or chocolate 12 hours prior to arrival.  3. NO perfume, cologne or lotion  4. Total time is 1 to 2 hours; you may want to bring reading material for the waiting time.  5. Please report to Admitting at the [facility] Main Entrance 60 minutes early for your test.  [address] [place], [place] [address]  Diabetic Preparation:  Hold oral medications. You may take NPH and Lantus insulin. Do not take Humalog or Humulin R (Regular Insulin) the day of your test. Check blood sugars prior to leaving the house. If able to eat breakfast prior to 3 hour fasting, you may take all medications, including your insulin, Do not worry if you miss your breakfast dose of insulin - start at your next meal.  IF YOU THINK YOU MAY BE PREGNANT, OR ARE NURSING PLEASE INFORM THE TECHNOLOGIST.  In preparation for your appointment, medication and supplies will be purchased.  Appointment availability is limited, so if you need to cancel or reschedule, please call the Radiology Department at [phone]  24 hours  in advance to avoid a cancellation fee of $100.00  What to Expect After you Arrive:  Once you arrive and check in for your appointment, you will be taken to a preparation room within the Radiology Department.  A technologist or Nurse will obtain your medical history, verify that you are correctly prepped for the exam, and explain the procedure.  Afterwards,  an IV will be started in your arm and electrodes will be placed on your skin for EKG monitoring during the stress  portion of the exam. Then you will be escorted to the PET/CT scanner.  There, staff will get you positioned on the scanner and obtain a blood pressure and EKG.  During the exam, you will continue to be connected to the EKG and blood pressure machines.  A small, safe amount of a radioactive tracer will be injected in your IV to obtain a series of pictures of your heart along with an injection of a stress agent.    After your Exam:  It is recommended that you eat a meal and drink a caffeinated beverage to counter act any effects of the stress agent.  Drink plenty of fluids for the remainder of the day and urinate frequently for the first couple of hours after the exam.  Your doctor will inform you of your test results within 7-10 business days.  For questions about your test or how to prepare for your test, please call: Rockwell Alexandria, Cardiac Imaging Nurse Navigator  Larey Brick, Cardiac Imaging Nurse Navigator Office: [phone]   You have been referred to [facility]. They will call you to arrange your appointment.  You have been referred to Cardiac Electrophysiologist. Their office will call you to arrange your appointment.   Your physician recommends that you schedule a follow-up appointment in: 3 weeks  If you have any questions or concerns before your next appointment please send [place] a message through [place] or call our office at [phone].    TO LEAVE A MESSAGE FOR THE NURSE SELECT OPTION 2, PLEASE LEAVE A MESSAGE INCLUDING: YOUR NAME DATE OF BIRTH CALL BACK NUMBER REASON FOR CALL**this is important as we prioritize the call backs  YOU WILL RECEIVE A CALL BACK THE SAME DAY AS LONG AS YOU CALL BEFORE 4:00 PM  At the [facility], you and your health needs are our priority. As part of our continuing mission to provide you with exceptional heart care, we have created designated Provider Care Teams. These Care Teams include your primary Cardiologist  (physician) and Advanced Practice Providers (APPs- Physician Assistants and Nurse Practitioners) who all work together to provide you with the care you need, when you need it.   You may see any of the following providers on your designated Care Team at your next follow up: Dr Arvilla Meres Dr Carron Curie, NP Robbie Lis, [place] [facility] [place], [place] Karle Plumber, PharmD   Please be sure to bring in all your medications bottles to every appointment.

## 2022-06-16 NOTE — H&P (View-Only) (Signed)
 ADVANCED HF CLINIC CLINIC NOTE   PCP: Chelle Jefferies HF Cardiologist: Dr. Lynelle Weiler  HPI: Mr. Cohrs is a 36 y.o. male with past medical history of hypertension, hyperlipidemia, insulin-dependent diabetes mellitus since 36 years of age, CKD stage III, OSA and chronic systolic heart failure.    He was seen 6/22 by PCP, referred to cardiology for echo. Echo 9/22 showed EF 20 to 25%, moderate to severe MR, moderate to severe TR, RVSP 60 mmHg.  He was started on GDMT. Zio placed 10/22 showing average heart rate 92, patient remained tachycardic 8.5% of total time, 3 episodes of wide-complex tachycardia with longest run 20 beats.   He saw a cardiologist at [facility], 10/22.  It was recommended he undergo outpatient Myoview, start LifeVest, then repeat echocardiogram in 3 months.  He did not follow back up. He wore the LifeVest for about a week before he discontinued it.    Admitted 5/23 to [place] with left lower extremity pain. WBC 30.7, felt he was also slightly volume overloaded. Diuresed with IV lasix and placed on abx. LE dopplers negative for DVT.  Echo showed EF 20-25%, global hypokinesis with moderate LV dilation, mod-severe MR (severe by PISA ERO), moderate TR.  He underwent  RHC showing mildly elevated PCWP and normal RA pressure with mild pulmonary venous hypertension, CI 3.33 (off milrinone x 1 hour). No LHC with elevated SCr. Cardiac MRI showed >50% wall thickness subendocardial LGE in the mid anteroseptal wall, looked like a coronary disease pattern suggestive of prior MI. LVEF 15%. GDMT limited by AKI. Planned for ischemic eval in future when creatinine comes down. Discharged home, weight 264 lbs.  He presented for visit with PharmD for medication titration on 06/27. He was added on for urgent APP appointment. Wt noted to be up ~20 lb since prior APP clinic visit on 6/1. ReDs Clip elevated 59%. Admitted to dietary indiscretion w/ sodium. He was given 80 mg lasix IV and started on  furoscix 80 mg daily X 2 days. Jardiance restarted.   Follow up 04/23/22, remained overloaded, ReDs 58%. Torsemide increased to 40 bid and metolazone added weekly.   Echo was done today and reviewed, EF 20% with severe LV dilation, moderate RV dilation with mildly decreased systolic function, mod-severe TR, mod-severe functional MR, PASP 64 mmHg, dilated IVC.   Today he returns for HF follow up.  Weight is up 4 lbs.  He is short of breath with heavy exertion.  No dyspnea walking up stairs, walking on flat ground.  No orthopnea/PND.  Rare atypical chest pain.  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 No ETOH/drugs.  Mother died of CHF, she was a diabetic.   Labs (7/23): K 5.0, creatinine 2.44, BNP 2297  ECG (personally reviewed): NSR, LVH with repolarization abnormality  Review of Systems: All systems reviewed and negative except as mentioned in HPI.  PMH: 1. OSA 2. Type 2 diabetes: Since age 14.  3. HTN: First noted in high school. 4. CKD stage 3: Likely due to diabetes and HTN.  5. Hyperlipidemia 6. Chronic systolic CHF: Cardiomyopathy of uncertain etiology.  - Echo (9/22): EF 20-25%, mod-severe MR.  - Echo (5/23): EF 20-25%, moderate LV dilation, mod-severe MR, moderate TR - Cardiac MRI (5/23): Technically difficult, LVEF 15% with severe LV dilation, RVEF 15% with severe RV dilation, >50% subendocardial LGE mid anteroseptal wall (?prior MI).  - RHC (5/23): mean RA 8, PA 58/27, mean PCWP 17, CI 3.33 - Echo (8/23): EF 20%   with severe LV dilation, moderate RV dilation with mildly decreased systolic function, mod-severe TR, mod-severe functional MR, PASP 64 mmHg, dilated IVC.  7. H/o diabetic foot with toe amputation.    Current Outpatient Medications  Medication Sig Dispense Refill   acetaminophen (TYLENOL) 325 MG tablet Take 2 tablets (650 mg total) by mouth every 6 (six) hours as needed for mild pain (or Fever >/= 101).     aspirin 81 MG EC tablet Take 1 tablet (81 mg total)  by mouth daily. 30 tablet 0   atorvastatin (LIPITOR) 40 MG tablet Take 1 tablet (40 mg total) by mouth daily. 90 tablet 3   blood glucose meter kit and supplies Dispense based on patient and insurance preference. Use up to four times daily as directed. (FOR ICD-10 E10.9, E11.9). 1 each 0   carvedilol (COREG) 12.5 MG tablet Take 1.5 tablets (18.75 mg total) by mouth 2 (two) times daily. 90 tablet 3   dapagliflozin propanediol (FARXIGA) 10 MG TABS tablet Take 1 tablet (10 mg total) by mouth daily before breakfast. 30 tablet 3   glipiZIDE (GLUCOTROL) 5 MG tablet Take 1 tablet (5 mg total) by mouth daily before breakfast. 30 tablet 0   Insulin Pen Needle 30G X 5 MM MISC New needle with every insulin adminsration 100 each 5   metolazone (ZAROXOLYN) 2.5 MG tablet Take 1 tablet (2.5 mg total) by mouth once a week. On Saturdays only 13 tablet 3   hydrALAZINE (APRESOLINE) 50 MG tablet Take 1 tablet (50 mg total) by mouth 3 (three) times daily. 180 tablet 3   isosorbide mononitrate (IMDUR) 60 MG 24 hr tablet Take 1 tablet (60 mg total) by mouth daily. 90 tablet 3   torsemide (DEMADEX) 20 MG tablet Take 4 tablets (80 mg total) by mouth in the morning AND 2 tablets (40 mg total) every evening. 360 tablet 3   No current facility-administered medications for this encounter.   No Known Allergies  Social History   Socioeconomic History   Marital status: Single    Spouse name: Not on file   Number of children: Not on file   Years of education: Not on file   Highest education level: Not on file  Occupational History   Not on file  Tobacco Use   Smoking status: Never   Smokeless tobacco: Not on file  Vaping Use   Vaping Use: Never used  Substance and Sexual Activity   Alcohol use: No    Alcohol/week: 0.0 standard drinks of alcohol   Drug use: No   Sexual activity: Not on file  Other Topics Concern   Not on file  Social History Narrative   Not on file   Social Determinants of Health    Financial Resource Strain: Not on file  Food Insecurity: Not on file  Transportation Needs: Not on file  Physical Activity: Not on file  Stress: Not on file  Social Connections: Not on file  Intimate Partner Violence: Not on file   Family History  Problem Relation Age of Onset   Diabetes Mother    Hypertension Mother    Stroke Mother    Alcohol abuse Father    Cancer Maternal Grandmother    Diabetes Maternal Grandmother    Hypertension Maternal Grandmother    His mother also had heart failure in her 60s and passed away from the heart failure.    BP 120/82   Pulse 91   Wt 110.3 kg (243 lb 3.2 oz)   SpO2   99%   BMI 31.23 kg/m   Wt Readings from Last 3 Encounters:  06/16/22 110.3 kg (243 lb 3.2 oz)  05/03/22 108.6 kg (239 lb 6.4 oz)  04/23/22 119.9 kg (264 lb 6.4 oz)   PHYSICAL EXAM: General: NAD Neck: JVP 10-12 cm, no thyromegaly or thyroid nodule.  Lungs: Clear to auscultation bilaterally with normal respiratory effort. CV: Lateral PMI.  Heart regular S1/S2, +S3, 2/6 HSM LLSB/apex.  1+ ankle edema.  No carotid bruit.  Normal pedal pulses.  Abdomen: Soft, nontender, no hepatosplenomegaly, no distention.  Skin: Intact without lesions or rashes.  Neurologic: Alert and oriented x 3.  Psych: Normal affect. Extremities: No clubbing or cyanosis.  HEENT: Normal.   ASSESSMENT & PLAN: 1. Chronic systolic CHF: Cardiomyopathy of uncertain etiology.  Echo in 9/22 with EF 20-25%, moderate-severe MR, moderate-severe TR.  Echo 05/23 EF 20-25%, global hypokinesis with moderate LV dilation, mod-severe MR (severe by PISA ERO), moderate TR.  He has had HTN since his teens, could be due to long-standing poorly controlled HTN. With DM/HTN/hyperlipidemia x years, CAD must be a consideration though no chest pain. Prior viral myocarditis is possible as well. Additionally, his mother died of CHF in her early 68s.  cMRI limited study, difficult images due to problems with breath-holding. >50%  wall thickness subendocardial LGE in the mid anteroseptal wall, looks like a coronary disease pattern suggestive of prior MI though does not seem extensive enough to cause his degree of LV dysfunction. LVEF 15% on cMRI.  Echo done today showed EF 20% with severe LV dilation, moderate RV dilation with mildly decreased systolic function, mod-severe TR, mod-severe functional MR, PASP 64 mmHg, dilated IVC.  Narrow QRS, not CRT candidate. NYHA class II symptoms but he remains significantly volume overloaded on exam. GDMT has been limited by CKD stage 3b.  - Increase torsemide to 80 qam/40 qpm with metolazone 2.5 on Saturday.  BMET/BNP today and BMET in 1 week.  - Continue carvedilol 18.75 mg bid. - Continue Farxiga 10 mg daily. - Increase hydralazine to 50 mg tid and Imdur to 60 mg daily.  - Next step will be spironolactone if creatinine and K remain stable (has been hyperkalemic in past).  - Persistently low EF, refer to EP for ICD (not CRT candidate).  - I will arrange for cardiac PET for ischemia evaluation (not cath candidate with CKD stage 3b).  - Given family history of cardiomyopathy (mother), will arrange for [employer] gene testing for common familial cardiomoypathies.  - He will need eventual CPX.   - I am concerned about his overall trajectory, volume has been difficult to manage.  He may end up needing evaluation relatively soon for heart/kidney transplant.  2. 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  - BMET today. - Refer to nephrology.  - Continue SGLT2i. 3. Type 2 diabetes: Early onset.  - Continue SGLT2i. 4. HTN: Early onset in teens. Controlled on current meds. - GDMT per above  5. Hyperlipidemia: Continue atorvastatin.  6. Diabetic foot s/p toe amputation.  7. Mitral regurgitation: Moderate to severe functional MR.  He may be a Mitraclip candidate if the LV is not too dilated.  - I will arrange for TEE to assess for Mitraclip candidacy.   Discussed risks/benefits with patient and he agrees to procedure.  8. OSA: Needs CPAP, has sleep medicine appt.  Followup in 3 wks with me.  Will need close followup.   Loralie Champagne. 06/16/2022

## 2022-06-22 ENCOUNTER — Ambulatory Visit: Payer: 59 | Attending: Cardiology | Admitting: Cardiology

## 2022-06-23 ENCOUNTER — Ambulatory Visit (HOSPITAL_COMMUNITY)
Admission: RE | Admit: 2022-06-23 | Discharge: 2022-06-23 | Disposition: A | Discharge status: Home / Self Care | Payer: 59 | Source: Ambulatory Visit | Attending: Cardiology | Admitting: Cardiology

## 2022-06-23 DIAGNOSIS — I5022 Chronic systolic (congestive) heart failure: Secondary | ICD-10-CM | POA: Diagnosis present

## 2022-06-23 LAB — BASIC METABOLIC PANEL
Anion gap: 9 (ref 5–15)
BUN: 65 mg/dL — ABNORMAL HIGH (ref 6–20)
CO2: 25 mmol/L (ref 22–32)
Calcium: 9.1 mg/dL (ref 8.9–10.3)
Chloride: 105 mmol/L (ref 98–111)
Creatinine, Ser: 2.48 mg/dL — ABNORMAL HIGH (ref 0.61–1.24)
GFR, Estimated: 34 mL/min — ABNORMAL LOW (ref >60)
Glucose, Bld: 240 mg/dL — ABNORMAL HIGH (ref 70–99)
Potassium: 4.3 mmol/L (ref 3.5–5.1)
Sodium: 139 mmol/L (ref 135–145)

## 2022-06-24 ENCOUNTER — Encounter (HOSPITAL_COMMUNITY): Payer: Self-pay | Admitting: Cardiology

## 2022-06-30 NOTE — Anesthesia Preprocedure Evaluation (Addendum)
Anesthesia Evaluation  Patient identified by MRN, date of birth, ID band Patient awake    Reviewed: Allergy & Precautions, NPO status , Patient's Chart, lab work & pertinent test results  History of Anesthesia Complications Negative for: history of anesthetic complications  Airway Mallampati: II  TM Distance: >3 FB Neck ROM: Full    Dental  (+) Teeth Intact   Pulmonary neg pulmonary ROS,    Pulmonary exam normal        Cardiovascular Exercise Tolerance: Good hypertension, Pt. on medications and Pt. on home beta blockers pulmonary hypertension+CHF  Normal cardiovascular exam+ Valvular Problems/Murmurs MR   Echo 06/16/22: EF 20%, global hypokinesis, gr3 dd, mildly reduced RVSF, severe pulm HTN (PASP 63), mod to severe MR, mod to sev TR   Neuro/Psych negative neurological ROS     GI/Hepatic negative GI ROS, Neg liver ROS,   Endo/Other  diabetes, Type 2, Insulin Dependent  Renal/GU CRFRenal disease (Cr 2.48)  negative genitourinary   Musculoskeletal negative musculoskeletal ROS (+)   Abdominal   Peds  Hematology negative hematology ROS (+)   Anesthesia Other Findings   Reproductive/Obstetrics                           Anesthesia Physical Anesthesia Plan  ASA: 4  Anesthesia Plan: MAC   Post-op Pain Management: Ketamine IV*   Induction: Intravenous  PONV Risk Score and Plan: 1 and Propofol infusion, TIVA, Treatment may vary due to age or medical condition and Midazolam  Airway Management Planned: Natural Airway, Nasal Cannula and Simple Face Mask  Additional Equipment: None  Intra-op Plan:   Post-operative Plan:   Informed Consent: I have reviewed the patients History and Physical, chart, labs and discussed the procedure including the risks, benefits and alternatives for the proposed anesthesia with the patient or authorized representative who has indicated his/her understanding and  acceptance.       Plan Discussed with:   Anesthesia Plan Comments:        Anesthesia Quick Evaluation

## 2022-07-01 ENCOUNTER — Ambulatory Visit (HOSPITAL_BASED_OUTPATIENT_CLINIC_OR_DEPARTMENT_OTHER)
Admission: RE | Admit: 2022-07-01 | Discharge: 2022-07-01 | Disposition: A | Discharge status: Home / Self Care | Payer: 59 | Source: Home / Self Care | Attending: Cardiology | Admitting: Cardiology

## 2022-07-01 ENCOUNTER — Encounter (HOSPITAL_COMMUNITY)
Admission: RE | Disposition: A | Discharge status: Home / Self Care | Payer: Self-pay | Source: Home / Self Care | Attending: Cardiology

## 2022-07-01 ENCOUNTER — Ambulatory Visit (HOSPITAL_COMMUNITY): Payer: 59 | Admitting: Certified Registered"

## 2022-07-01 ENCOUNTER — Ambulatory Visit (HOSPITAL_COMMUNITY)
Admission: RE | Admit: 2022-07-01 | Discharge: 2022-07-01 | Disposition: A | Discharge status: Home / Self Care | Payer: 59 | Attending: Cardiology | Admitting: Cardiology

## 2022-07-01 ENCOUNTER — Ambulatory Visit (HOSPITAL_BASED_OUTPATIENT_CLINIC_OR_DEPARTMENT_OTHER): Payer: 59 | Admitting: Certified Registered"

## 2022-07-01 ENCOUNTER — Other Ambulatory Visit: Payer: Self-pay

## 2022-07-01 ENCOUNTER — Encounter (HOSPITAL_COMMUNITY): Payer: Self-pay | Admitting: Cardiology

## 2022-07-01 DIAGNOSIS — E785 Hyperlipidemia, unspecified: Secondary | ICD-10-CM | POA: Insufficient documentation

## 2022-07-01 DIAGNOSIS — N1831 Chronic kidney disease, stage 3a: Secondary | ICD-10-CM

## 2022-07-01 DIAGNOSIS — I5022 Chronic systolic (congestive) heart failure: Secondary | ICD-10-CM | POA: Diagnosis not present

## 2022-07-01 DIAGNOSIS — I130 Hypertensive heart and chronic kidney disease with heart failure and stage 1 through stage 4 chronic kidney disease, or unspecified chronic kidney disease: Secondary | ICD-10-CM | POA: Diagnosis not present

## 2022-07-01 DIAGNOSIS — N1832 Chronic kidney disease, stage 3b: Secondary | ICD-10-CM | POA: Diagnosis not present

## 2022-07-01 DIAGNOSIS — I081 Rheumatic disorders of both mitral and tricuspid valves: Secondary | ICD-10-CM | POA: Diagnosis not present

## 2022-07-01 DIAGNOSIS — Z7984 Long term (current) use of oral hypoglycemic drugs: Secondary | ICD-10-CM | POA: Diagnosis not present

## 2022-07-01 DIAGNOSIS — Z8249 Family history of ischemic heart disease and other diseases of the circulatory system: Secondary | ICD-10-CM | POA: Insufficient documentation

## 2022-07-01 DIAGNOSIS — I340 Nonrheumatic mitral (valve) insufficiency: Secondary | ICD-10-CM

## 2022-07-01 DIAGNOSIS — G4733 Obstructive sleep apnea (adult) (pediatric): Secondary | ICD-10-CM | POA: Insufficient documentation

## 2022-07-01 DIAGNOSIS — E1122 Type 2 diabetes mellitus with diabetic chronic kidney disease: Secondary | ICD-10-CM | POA: Insufficient documentation

## 2022-07-01 DIAGNOSIS — Z79899 Other long term (current) drug therapy: Secondary | ICD-10-CM | POA: Diagnosis not present

## 2022-07-01 DIAGNOSIS — Z794 Long term (current) use of insulin: Secondary | ICD-10-CM

## 2022-07-01 DIAGNOSIS — I429 Cardiomyopathy, unspecified: Secondary | ICD-10-CM | POA: Insufficient documentation

## 2022-07-01 DIAGNOSIS — I361 Nonrheumatic tricuspid (valve) insufficiency: Secondary | ICD-10-CM

## 2022-07-01 HISTORY — PX: TEE WITHOUT CARDIOVERSION: SHX5443

## 2022-07-01 LAB — POCT I-STAT, CHEM 8
BUN: 82 mg/dL — ABNORMAL HIGH (ref 6–20)
Calcium, Ion: 1.08 mmol/L — ABNORMAL LOW (ref 1.15–1.40)
Chloride: 106 mmol/L (ref 98–111)
Creatinine, Ser: 2.9 mg/dL — ABNORMAL HIGH (ref 0.61–1.24)
Glucose, Bld: 185 mg/dL — ABNORMAL HIGH (ref 70–99)
HCT: 40 % (ref 39.0–52.0)
Hemoglobin: 13.6 g/dL (ref 13.0–17.0)
Potassium: 4.3 mmol/L (ref 3.5–5.1)
Sodium: 139 mmol/L (ref 135–145)
TCO2: 25 mmol/L (ref 22–32)

## 2022-07-01 LAB — ECHO TEE
MV M vel: 3.36 m/s
MV Peak grad: 45.2 mmHg
Radius: 0.7 cm
S' Lateral: 6.4 cm

## 2022-07-01 SURGERY — ECHOCARDIOGRAM, TRANSESOPHAGEAL
Anesthesia: Monitor Anesthesia Care

## 2022-07-01 MED ORDER — KETAMINE HCL 10 MG/ML IJ SOLN
INTRAMUSCULAR | Status: DC | PRN
  Administered 2022-07-01 (×2): 10 mg via INTRAVENOUS

## 2022-07-01 MED ORDER — BUTAMBEN-TETRACAINE-BENZOCAINE 2-2-14 % EX AERO
INHALATION_SPRAY | CUTANEOUS | Status: DC | PRN
  Administered 2022-07-01: 2 via TOPICAL

## 2022-07-01 MED ORDER — SODIUM CHLORIDE 0.9 % IV SOLN: INTRAVENOUS | Status: DC

## 2022-07-01 MED ORDER — PROPOFOL 500 MG/50ML IV EMUL
INTRAVENOUS | Status: DC | PRN
  Administered 2022-07-01: 100 ug/kg/min via INTRAVENOUS

## 2022-07-01 MED ORDER — SODIUM CHLORIDE 0.9 % IV SOLN
INTRAVENOUS | Status: AC | PRN
  Administered 2022-07-01: 500 mL via INTRAVENOUS

## 2022-07-01 MED ORDER — MIDAZOLAM HCL 2 MG/2ML IJ SOLN
INTRAMUSCULAR | Status: DC | PRN
  Administered 2022-07-01: 2 mg via INTRAVENOUS

## 2022-07-01 MED ORDER — PROPOFOL 10 MG/ML IV BOLUS
INTRAVENOUS | Status: DC | PRN
  Administered 2022-07-01 (×3): 10 mg via INTRAVENOUS

## 2022-07-01 NOTE — Transfer of Care (Signed)
Immediate Anesthesia Transfer of Care Note  Patient: Jeffery Villa  Procedure(s) Performed: TRANSESOPHAGEAL ECHOCARDIOGRAM (TEE)  Patient Location: Endoscopy Unit  Anesthesia Type:MAC  Level of Consciousness: drowsy and patient cooperative  Airway & Oxygen Therapy: Patient Spontanous Breathing and Patient connected to nasal cannula oxygen  Post-op Assessment: Report given to RN, Post -op Vital signs reviewed and stable and Patient moving all extremities X 4  Post vital signs: Reviewed and stable  Last Vitals:  Vitals Value Taken Time  BP 106/72 07/01/22 0819  Temp    Pulse 87 07/01/22 0819  Resp 27 07/01/22 0819  SpO2 96 % 07/01/22 0819  Vitals shown include unvalidated device data.  Last Pain:  Vitals:   07/01/22 0654  TempSrc: Temporal  PainSc: 0-No pain         Complications: No notable events documented.

## 2022-07-01 NOTE — Anesthesia Postprocedure Evaluation (Signed)
Anesthesia Post Note  Patient: Bracken Moffa Theall  Procedure(s) Performed: TRANSESOPHAGEAL ECHOCARDIOGRAM (TEE)     Patient location during evaluation: Endoscopy Anesthesia Type: MAC Level of consciousness: awake and alert Pain management: pain level controlled Vital Signs Assessment: post-procedure vital signs reviewed and stable Respiratory status: spontaneous breathing, nonlabored ventilation and respiratory function stable Cardiovascular status: blood pressure returned to baseline and stable Postop Assessment: no apparent nausea or vomiting Anesthetic complications: no   No notable events documented.  Last Vitals:  Vitals:   07/01/22 0840 07/01/22 0850  BP: 116/86 (!) 117/91  Pulse: 88 88  Resp: 17 (!) 22  SpO2: 99% 96%    Last Pain:  Vitals:   07/01/22 0850  TempSrc:   PainSc: 0-No pain                 Lidia Collum

## 2022-07-01 NOTE — CV Procedure (Signed)
Procedure: TEE  Sedation: Per anesthesiology  Indication: Mitral regurgitation  Findings: Please see echo section for full report.  I think that the mitral regurgitation is in the moderate range, do not think he needs Mitraclip or specific mitral intervention at this point.   Marca Ancona 07/01/2022 9:39 AM

## 2022-07-01 NOTE — Interval H&P Note (Signed)
History and Physical Interval Note:  07/01/2022 7:42 AM  Jeffery Villa  has presented today for surgery, with the diagnosis of MITRAL REGURGIGATION.  The various methods of treatment have been discussed with the patient and family. After consideration of risks, benefits and other options for treatment, the patient has consented to  Procedure(s): TRANSESOPHAGEAL ECHOCARDIOGRAM (TEE) (N/A) as a surgical intervention.  The patient's history has been reviewed, patient examined, no change in status, stable for surgery.  I have reviewed the patient's chart and labs.  Questions were answered to the patient's satisfaction.     Markevion Lattin [employer]

## 2022-07-01 NOTE — Progress Notes (Signed)
  Echocardiogram Echocardiogram Transesophageal has been performed.  Gerda Diss 07/01/2022, 9:25 AM

## 2022-07-01 NOTE — Discharge Instructions (Signed)

## 2022-07-04 ENCOUNTER — Encounter (HOSPITAL_COMMUNITY): Payer: Self-pay | Admitting: Cardiology

## 2022-07-07 ENCOUNTER — Encounter (HOSPITAL_COMMUNITY): Payer: Self-pay | Admitting: Cardiology

## 2022-07-07 ENCOUNTER — Ambulatory Visit (HOSPITAL_COMMUNITY)
Admission: RE | Admit: 2022-07-07 | Discharge: 2022-07-07 | Disposition: A | Discharge status: Home / Self Care | Payer: 59 | Source: Ambulatory Visit | Attending: Cardiology | Admitting: Cardiology

## 2022-07-07 VITALS — BP 104/70 | HR 94 | Wt 245.4 lb

## 2022-07-07 DIAGNOSIS — E1122 Type 2 diabetes mellitus with diabetic chronic kidney disease: Secondary | ICD-10-CM | POA: Insufficient documentation

## 2022-07-07 DIAGNOSIS — N1832 Chronic kidney disease, stage 3b: Secondary | ICD-10-CM | POA: Diagnosis not present

## 2022-07-07 DIAGNOSIS — Z79899 Other long term (current) drug therapy: Secondary | ICD-10-CM | POA: Insufficient documentation

## 2022-07-07 DIAGNOSIS — I081 Rheumatic disorders of both mitral and tricuspid valves: Secondary | ICD-10-CM | POA: Insufficient documentation

## 2022-07-07 DIAGNOSIS — Z7984 Long term (current) use of oral hypoglycemic drugs: Secondary | ICD-10-CM | POA: Insufficient documentation

## 2022-07-07 DIAGNOSIS — G4733 Obstructive sleep apnea (adult) (pediatric): Secondary | ICD-10-CM | POA: Diagnosis not present

## 2022-07-07 DIAGNOSIS — E785 Hyperlipidemia, unspecified: Secondary | ICD-10-CM | POA: Diagnosis not present

## 2022-07-07 DIAGNOSIS — Z794 Long term (current) use of insulin: Secondary | ICD-10-CM | POA: Diagnosis not present

## 2022-07-07 DIAGNOSIS — I2510 Atherosclerotic heart disease of native coronary artery without angina pectoris: Secondary | ICD-10-CM | POA: Insufficient documentation

## 2022-07-07 DIAGNOSIS — I5022 Chronic systolic (congestive) heart failure: Secondary | ICD-10-CM

## 2022-07-07 DIAGNOSIS — I130 Hypertensive heart and chronic kidney disease with heart failure and stage 1 through stage 4 chronic kidney disease, or unspecified chronic kidney disease: Secondary | ICD-10-CM | POA: Diagnosis not present

## 2022-07-07 DIAGNOSIS — I272 Pulmonary hypertension, unspecified: Secondary | ICD-10-CM | POA: Insufficient documentation

## 2022-07-07 LAB — BRAIN NATRIURETIC PEPTIDE: B Natriuretic Peptide: 1558.9 pg/mL — ABNORMAL HIGH (ref 0.0–100.0)

## 2022-07-07 LAB — BASIC METABOLIC PANEL
Anion gap: 8 (ref 5–15)
BUN: 73 mg/dL — ABNORMAL HIGH (ref 6–20)
CO2: 26 mmol/L (ref 22–32)
Calcium: 9.4 mg/dL (ref 8.9–10.3)
Chloride: 104 mmol/L (ref 98–111)
Creatinine, Ser: 3 mg/dL — ABNORMAL HIGH (ref 0.61–1.24)
GFR, Estimated: 27 mL/min — ABNORMAL LOW (ref >60)
Glucose, Bld: 69 mg/dL — ABNORMAL LOW (ref 70–99)
Potassium: 4.3 mmol/L (ref 3.5–5.1)
Sodium: 138 mmol/L (ref 135–145)

## 2022-07-07 MED ORDER — SPIRONOLACTONE 25 MG PO TABS: 12.5000 mg | ORAL_TABLET | Freq: Every day | ORAL | 3 refills | Status: DC

## 2022-07-07 NOTE — Patient Instructions (Addendum)
Start Spironolactone 12.5 mg daily.  Labs done today, your results will be available in MyChart, we will contact you for abnormal readings.  Repeat blood work in 10 days.  Your physician has recommended that you have a cardiopulmonary stress test (CPX). CPX testing is a non-invasive measurement of heart and lung function. It replaces a traditional treadmill stress test. This type of test provides a tremendous amount of information that relates not only to your present condition but also for future outcomes. This test combines measurements of you ventilation, respiratory gas exchange in the lungs, electrocardiogram (EKG), blood pressure and physical response before, during, and following an exercise protocol.   How to Prepare for Your Cardiac PET/CT Stress Test:  1. Please do not take these medications before your test:   Medications that may interfere with the cardiac pharmacological stress agent (ex. nitrates - including erectile dysfunction medications or beta-blockers) the day of the exam. (Erectile dysfunction medication should be held for at least 72 hrs prior to test) Theophylline containing medications for 12 hours. Dipyridamole 48 hours prior to the test. Your remaining medications may be taken with water.  2. Nothing to eat or drink, except water, 3 hours prior to arrival time.   NO caffeine/decaffeinated products, or chocolate 12 hours prior to arrival.  3. NO perfume, cologne or lotion  4. Total time is 1 to 2 hours; you may want to bring reading material for the waiting time.  5. Please report to Admitting at the [facility] Main Entrance 60 minutes early for your test.  [address] [place], [place] [address]  Diabetic Preparation:  Hold oral medications. You may take NPH and Lantus insulin. Do not take Humalog or Humulin R (Regular Insulin) the day of your test. Check blood sugars prior to leaving the house. If able to eat breakfast prior to 3 hour  fasting, you may take all medications, including your insulin, Do not worry if you miss your breakfast dose of insulin - start at your next meal.  IF YOU THINK YOU MAY BE PREGNANT, OR ARE NURSING PLEASE INFORM THE TECHNOLOGIST.  In preparation for your appointment, medication and supplies will be purchased.  Appointment availability is limited, so if you need to cancel or reschedule, please call the Radiology Department at [phone]  24 hours in advance to avoid a cancellation fee of $100.00  What to Expect After you Arrive:  Once you arrive and check in for your appointment, you will be taken to a preparation room within the Radiology Department.  A technologist or Nurse will obtain your medical history, verify that you are correctly prepped for the exam, and explain the procedure.  Afterwards,  an IV will be started in your arm and electrodes will be placed on your skin for EKG monitoring during the stress portion of the exam. Then you will be escorted to the PET/CT scanner.  There, staff will get you positioned on the scanner and obtain a blood pressure and EKG.  During the exam, you will continue to be connected to the EKG and blood pressure machines.  A small, safe amount of a radioactive tracer will be injected in your IV to obtain a series of pictures of your heart along with an injection of a stress agent.    After your Exam:  It is recommended that you eat a meal and drink a caffeinated beverage to counter act any effects of the stress agent.  Drink plenty of fluids for the remainder  of the day and urinate frequently for the first couple of hours after the exam.  Your doctor will inform you of your test results within 7-10 business days.  For questions about your test or how to prepare for your test, please call: Rockwell Alexandria, Cardiac Imaging Nurse Navigator  Larey Brick, Cardiac Imaging Nurse Navigator Office: [phone]  PLEASE CALL DR. Norris Cross OFFICE TO RESCHEDULE YOUR  APPOINTMENT WITH [facility].  Your physician recommends that you schedule a follow-up appointment in: 1 month  If you have any questions or concerns before your next appointment please send [place] a message through [place] or call our office at [phone].    TO LEAVE A MESSAGE FOR THE NURSE SELECT OPTION 2, PLEASE LEAVE A MESSAGE INCLUDING: YOUR NAME DATE OF BIRTH CALL BACK NUMBER REASON FOR CALL**this is important as we prioritize the call backs  YOU WILL RECEIVE A CALL BACK THE SAME DAY AS LONG AS YOU CALL BEFORE 4:00 PM  At the [facility], you and your health needs are our priority. As part of our continuing mission to provide you with exceptional heart care, we have created designated Provider Care Teams. These Care Teams include your primary Cardiologist (physician) and Advanced Practice Providers (APPs- Physician Assistants and Nurse Practitioners) who all work together to provide you with the care you need, when you need it.   You may see any of the following providers on your designated Care Team at your next follow up: Dr Arvilla Meres Dr Marca Ancona Dr. Marcos Eke, NP Robbie Lis, [place] [facility] [place], [place] Brynda Peon, NP Karle Plumber, PharmD   Please be sure to bring in all your medications bottles to every appointment.

## 2022-07-07 NOTE — Progress Notes (Signed)
ADVANCED HF CLINIC CLINIC NOTE   PCP: Joaquin Courts HF Cardiologist: Dr. Aundra Dubin  HPI: Mr. Segal is a 36 y.o. male with past medical history of hypertension, hyperlipidemia, insulin-dependent diabetes mellitus since 36 years of age, CKD stage III, OSA and chronic systolic heart failure.    He was seen 6/22 by PCP, referred to cardiology for echo. Echo 9/22 showed EF 20 to 25%, moderate to severe MR, moderate to severe TR, RVSP 60 mmHg.  He was started on GDMT. Zio placed 10/22 showing average heart rate 92, patient remained tachycardic 8.5% of total time, 3 episodes of wide-complex tachycardia with longest run 20 beats.   He saw a cardiologist at [facility], 10/22.  It was recommended he undergo outpatient Myoview, start LifeVest, then repeat echocardiogram in 3 months.  He did not follow back up. He wore the LifeVest for about a week before he discontinued it.    Admitted 5/23 to [facility] with left lower extremity pain. WBC 30.7, felt he was also slightly volume overloaded. Diuresed with IV lasix and placed on abx. LE dopplers negative for DVT.  Echo showed EF 20-25%, global hypokinesis with moderate LV dilation, mod-severe MR (severe by PISA ERO), moderate TR.  He underwent  RHC showing mildly elevated PCWP and normal RA pressure with mild pulmonary venous hypertension, CI 3.33 (off milrinone x 1 hour). No LHC with elevated SCr. Cardiac MRI showed >50% wall thickness subendocardial LGE in the mid anteroseptal wall, looked like a coronary disease pattern suggestive of prior MI. LVEF 15%. GDMT limited by AKI. Planned for ischemic eval in future when creatinine comes down. Discharged home, weight 264 lbs.  He presented for visit with PharmD for medication titration on 06/27. He was added on for urgent APP appointment. Wt noted to be up ~20 lb since prior APP clinic visit on 6/1. ReDs Clip elevated 59%. Admitted to dietary indiscretion w/ sodium. He was given 80 mg lasix IV and started on  furoscix 80 mg daily X 2 days. Jardiance restarted.   Follow up 04/23/22, remained overloaded, ReDs 58%. Torsemide increased to 40 bid and metolazone added weekly.   Echo in 8/23 showed EF 20% with severe LV dilation, moderate RV dilation with mildly decreased systolic function, mod-severe TR, mod-severe functional MR, PASP 64 mmHg, dilated IVC. TEE was done in 9/23 to assess MR, this showed EF 20-25%, LVESD 6.4 cm, PASP 69, moderately decreased RV function, moderate functional MR, dilated IVC, severe TR.   Today he returns for HF follow up.  Weight is up 2 lbs.  He feels good, no dyspnea currently.  He goes to the gym most days and lifts weight, bikes, and walks on the track. No orthopnea/PND.  No chest pain.  No lightheadedness.  He runs a food truck.   Labs (7/23): K 5.0, creatinine 2.44, BNP 2297 Labs (8/23): K 4.3, creatinine 2.48, BNP 2658  ECG (personally reviewed): NSR, LVH with repolarization abnormality  Review of Systems: All systems reviewed and negative except as mentioned in HPI.  PMH: 1. OSA 2. Type 2 diabetes: Since age 3.  60. HTN: First noted in high school. 4. CKD stage 3: Likely due to diabetes and HTN.  5. Hyperlipidemia 6. Chronic systolic CHF: Cardiomyopathy of uncertain etiology.  - Echo (9/22): EF 20-25%, mod-severe MR.  - Echo (5/23): EF 20-25%, moderate LV dilation, mod-severe MR, moderate TR - Cardiac MRI (5/23): Technically difficult, LVEF 15% with severe LV dilation, RVEF 15% with severe RV dilation, >50% subendocardial LGE  mid anteroseptal wall (?prior MI).  - RHC (5/23): mean RA 8, PA 58/27, mean PCWP 17, CI 3.33 - Echo (8/23): EF 20% with severe LV dilation, moderate RV dilation with mildly decreased systolic function, mod-severe TR, mod-severe functional MR, PASP 64 mmHg, dilated IVC.  - TEE (9/23): EF 20-25%, LVESD 6.4 cm, PASP 69, moderately decreased RV function, moderate functional MR, dilated IVC, severe TR.  - [employer] genetic testing: Sept9 gene  mutation, unknown significance.  7. H/o diabetic foot with toe amputation.  8. Mitral regurgitation: Moderate functional MR on 9/23 TEE.  9. Tricuspid regurgitation: Severe TR on 9/23 TEE.    Current Outpatient Medications  Medication Sig Dispense Refill   acetaminophen (TYLENOL) 325 MG tablet Take 2 tablets (650 mg total) by mouth every 6 (six) hours as needed for mild pain (or Fever >/= 101).     aspirin 81 MG EC tablet Take 1 tablet (81 mg total) by mouth daily. 30 tablet 0   atorvastatin (LIPITOR) 40 MG tablet Take 1 tablet (40 mg total) by mouth daily. 90 tablet 3   blood glucose meter kit and supplies Dispense based on patient and insurance preference. Use up to four times daily as directed. (FOR ICD-10 E10.9, E11.9). 1 each 0   carvedilol (COREG) 25 MG tablet Take 25 mg by mouth in the morning. 12.5 mg in the PM     dapagliflozin propanediol (FARXIGA) 10 MG TABS tablet Take 1 tablet (10 mg total) by mouth daily before breakfast. 30 tablet 3   glipiZIDE (GLUCOTROL) 5 MG tablet Take 1 tablet (5 mg total) by mouth daily before breakfast. 30 tablet 0   hydrALAZINE (APRESOLINE) 50 MG tablet Take 1 tablet (50 mg total) by mouth 3 (three) times daily. 180 tablet 3   Insulin Pen Needle 30G X 5 MM MISC New needle with every insulin adminsration 100 each 5   isosorbide mononitrate (IMDUR) 60 MG 24 hr tablet Take 1 tablet (60 mg total) by mouth daily. 90 tablet 3   metolazone (ZAROXOLYN) 2.5 MG tablet Take 1 tablet (2.5 mg total) by mouth once a week. On Saturdays only 13 tablet 3   spironolactone (ALDACTONE) 25 MG tablet Take 0.5 tablets (12.5 mg total) by mouth daily. 45 tablet 3   torsemide (DEMADEX) 20 MG tablet Take 4 tablets (80 mg total) by mouth in the morning AND 2 tablets (40 mg total) every evening. 360 tablet 3   No current facility-administered medications for this encounter.   No Known Allergies  Social History   Socioeconomic History   Marital status: Single    Spouse name:  Not on file   Number of children: Not on file   Years of education: Not on file   Highest education level: Not on file  Occupational History   Not on file  Tobacco Use   Smoking status: Never   Smokeless tobacco: Not on file  Vaping Use   Vaping Use: Never used  Substance and Sexual Activity   Alcohol use: No    Alcohol/week: 0.0 standard drinks of alcohol   Drug use: No   Sexual activity: Not on file  Other Topics Concern   Not on file  Social History Narrative   Not on file   Social Determinants of Health   Financial Resource Strain: Not on file  Food Insecurity: Not on file  Transportation Needs: Not on file  Physical Activity: Not on file  Stress: Not on file  Social Connections: Not on file  Intimate Partner Violence: Not on file   Family History  Problem Relation Age of Onset   Diabetes Mother    Hypertension Mother    Stroke Mother    Alcohol abuse Father    Cancer Maternal Grandmother    Diabetes Maternal Grandmother    Hypertension Maternal Grandmother    His mother also had heart failure in her 49s and passed away from the heart failure.    BP 104/70   Pulse 94   Wt 111.3 kg (245 lb 6.4 oz)   SpO2 98%   BMI 31.51 kg/m   Wt Readings from Last 3 Encounters:  07/07/22 111.3 kg (245 lb 6.4 oz)  07/01/22 113.4 kg (250 lb)  06/16/22 110.3 kg (243 lb 3.2 oz)   PHYSICAL EXAM: General: NAD Neck: No JVD, no thyromegaly or thyroid nodule.  Lungs: Clear to auscultation bilaterally with normal respiratory effort. CV: Lateral PMI.  Heart regular S1/S2, no S3/S4, 1/6 HSM apex.  No peripheral edema.  No carotid bruit.  Normal pedal pulses.  Abdomen: Soft, nontender, no hepatosplenomegaly, no distention.  Skin: Intact without lesions or rashes.  Neurologic: Alert and oriented x 3.  Psych: Normal affect. Extremities: No clubbing or cyanosis.  HEENT: Normal.   ASSESSMENT & PLAN: 1. Chronic systolic CHF: Cardiomyopathy of uncertain etiology.  Echo in 9/22  with EF 20-25%, moderate-severe MR, moderate-severe TR.  Echo 05/23 EF 20-25%, global hypokinesis with moderate LV dilation, mod-severe MR (severe by PISA ERO), moderate TR.  He has had HTN since his teens, could be due to long-standing poorly controlled HTN. With DM/HTN/hyperlipidemia x years, CAD must be a consideration though no chest pain. Prior viral myocarditis is possible as well. Additionally, his mother died of CHF in her early 57s.  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  cMRI limited study, difficult images due to problems with breath-holding. >50% wall thickness subendocardial LGE in the mid anteroseptal wall, looks like a coronary disease pattern suggestive of prior MI though does not seem extensive enough to cause his degree of LV dysfunction. LVEF 15% on cMRI.  Echo in 8/23 showed EF 20% with severe LV dilation, moderate RV dilation with mildly decreased systolic function, mod-severe TR, mod-severe functional MR, PASP 64 mmHg, dilated IVC.  Narrow QRS, not CRT candidate. NYHA class I-II symptoms, he is not volume overloaded on exam. GDMT has been limited by CKD stage 3b.  - Continue torsemide 80 qam/40 qpm with metolazone 2.5 on Saturday.  BMET/BNP today.  - If K and creatinine stable today, may be able to start spironolactone 12.5 mg daily (need to see labs first).  - Continue carvedilol 18.75 mg bid. - Continue Farxiga 10 mg daily. - Continue hydralazine 50 mg tid and Imdur 60 mg daily.   - Persistently low EF, has been referred to EP for ICD (not CRT candidate), has appt in 10/23.  - We have ordered cardiac PET for ischemia evaluation (not cath candidate with CKD stage 3b).  - I will arrange for CPX.  - I am concerned about his overall trajectory, volume has been difficult to manage.  He may end up needing evaluation relatively soon for heart/kidney transplant. Will see how CPX looks.  2. 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  - BMET today. - I have referred him to nephrology.  - Continue SGLT2i. 3. Type 2 diabetes: Early onset.  - Continue SGLT2i. 4. HTN:  Early onset in teens. Controlled on current meds. - GDMT per above  5. Hyperlipidemia: Continue atorvastatin.  6. Diabetic foot s/p toe amputation.  7. Mitral regurgitation: TEE in 9/23 showed moderate functional MR, do not think he would benefit from Mitraclip.   8. OSA: Needs CPAP, missed sleep medicine appt (over-slept), I will reschedule.   Followup in 1 month with APP.  Will need close followup.   Loralie Champagne. 07/07/2022

## 2022-07-08 ENCOUNTER — Telehealth (HOSPITAL_COMMUNITY): Payer: Self-pay | Admitting: Cardiology

## 2022-07-08 NOTE — Telephone Encounter (Signed)
Patient called.  Patient aware.Phone:  [phone] Judie Petit)

## 2022-07-08 NOTE — Telephone Encounter (Signed)
-----   Message from Laurey Morale, MD sent at 07/07/2022  4:05 PM EDT ----- Creatinine higher at 3.  Do not start spironolactone yet.  Hold torsemide for 1 day then restart.  Stop metolazone for now.  Keep sodium low in diet.  Try to expedite nephrology referral.

## 2022-07-19 ENCOUNTER — Ambulatory Visit (HOSPITAL_COMMUNITY)
Admission: RE | Admit: 2022-07-19 | Discharge: 2022-07-19 | Disposition: A | Discharge status: Home / Self Care | Payer: 59 | Source: Ambulatory Visit | Attending: Cardiology | Admitting: Cardiology

## 2022-07-19 DIAGNOSIS — I5022 Chronic systolic (congestive) heart failure: Secondary | ICD-10-CM | POA: Diagnosis not present

## 2022-07-19 LAB — BASIC METABOLIC PANEL
Anion gap: 6 (ref 5–15)
BUN: 57 mg/dL — ABNORMAL HIGH (ref 6–20)
CO2: 24 mmol/L (ref 22–32)
Calcium: 9.3 mg/dL (ref 8.9–10.3)
Chloride: 109 mmol/L (ref 98–111)
Creatinine, Ser: 2.39 mg/dL — ABNORMAL HIGH (ref 0.61–1.24)
GFR, Estimated: 36 mL/min — ABNORMAL LOW (ref >60)
Glucose, Bld: 87 mg/dL (ref 70–99)
Potassium: 4.1 mmol/L (ref 3.5–5.1)
Sodium: 139 mmol/L (ref 135–145)

## 2022-07-26 ENCOUNTER — Ambulatory Visit (HOSPITAL_COMMUNITY): Payer: 59 | Attending: Cardiology

## 2022-07-26 DIAGNOSIS — I5022 Chronic systolic (congestive) heart failure: Secondary | ICD-10-CM | POA: Diagnosis not present

## 2022-07-28 ENCOUNTER — Encounter: Payer: Self-pay | Admitting: Family

## 2022-07-28 ENCOUNTER — Telehealth (HOSPITAL_COMMUNITY): Payer: Self-pay | Admitting: *Deleted

## 2022-07-28 ENCOUNTER — Ambulatory Visit (INDEPENDENT_AMBULATORY_CARE_PROVIDER_SITE_OTHER): Payer: 59 | Admitting: Family

## 2022-07-28 DIAGNOSIS — T25221A Burn of second degree of right foot, initial encounter: Secondary | ICD-10-CM
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
## 2022-07-28 NOTE — Telephone Encounter (Signed)
Pet scan auth request faxed to [employer]

## 2022-08-02 ENCOUNTER — Institutional Professional Consult (permissible substitution): Payer: 59 | Admitting: Internal Medicine

## 2022-08-02 NOTE — Progress Notes (Deleted)
ELECTROPHYSIOLOGY CONSULT NOTE  Patient ID: Jeffery Villa, MRN: [id number], DOB/AGE: Sep 04, 1986 36 y.o. Admit date: (Not on file) Date of Consult: 08/02/2022  Primary Physician: Patient, No Pcp Per Primary Cardiologist: Jeffery Danyel Griess Villa is a 36 y.o. male who is being seen today for the evaluation of ICD at the request of Jeffery.   Chief Complaint: ***   HPI [name] is a 36 y.o. male referred for consideration of an ICD for primary prevention in the setting of a mixed cardiomyopathy    Hospitalized: 5/23 with lower extremity pain and found to be in acute/chronic congestive heart failure with moderate-severe MR moderate TR; evaluation included cMRI suggestive of a prior anteroseptal MI  PET stress scheduled for 10/23 DATE TEST EF   9//22 Echo   20-25 %   5/23 Echo   20-25 % MR severe  5/23 RHC  PCWP PA sys ***  5/23 cMRI 15%  >50% wall thickness subendocardial LGE  mid anteroseptal wall, suggestive of prior MI  8/23 Echo  20% MR mod-sev  functional TR severe  PASP 69 Biventricular LVE/RVE  10/23 PET Pending    Date Cr K Hgb  ***/*** *** *** ***  8/23 2.48 4.3 ***    Past Medical History:  Diagnosis Date   CHF (congestive heart failure) ([place])    Diabetes mellitus without complication ([place])    diagnosed at age 24 due to obesity, started on insulin at that time    High cholesterol    Hypertension        Surgical History:  Past Surgical History:  Procedure Laterality Date   AMPUTATION Right 09/25/2014   Procedure: AMPUTATION RIGHT 5th RAY;  Surgeon: Newt Minion, MD;  Location: [facility];  Service: Orthopedics;  Laterality: Right;   RIGHT HEART CATH N/A 03/08/2022   Procedure: RIGHT HEART CATH;  Surgeon: Larey Dresser, MD;  Location: [facility] [facility];  Service: Cardiovascular;  Laterality: N/A;   TEE WITHOUT CARDIOVERSION N/A 07/01/2022   Procedure: TRANSESOPHAGEAL ECHOCARDIOGRAM (TEE);  Surgeon: Larey Dresser, MD;  Location: [facility] ENDOSCOPY;   Service: Cardiovascular;  Laterality: N/A;     Home Meds: Prior to Admission medications   Medication Sig Start Date End Date Taking? Authorizing Provider  acetaminophen (TYLENOL) 325 MG tablet Take 2 tablets (650 mg total) by mouth every 6 (six) hours as needed for mild pain (or Fever >/= 101). 03/09/22   Domenic Polite, MD  aspirin 81 MG EC tablet Take 1 tablet (81 mg total) by mouth daily. 03/09/22 03/09/23  Domenic Polite, MD  atorvastatin (LIPITOR) 40 MG tablet Take 1 tablet (40 mg total) by mouth daily. 04/08/22   Larey Dresser, MD  blood glucose meter kit and supplies Dispense based on patient and insurance preference. Use up to four times daily as directed. (FOR ICD-10 E10.9, E11.9). 03/09/22   Domenic Polite, MD  carvedilol (COREG) 25 MG tablet Take 25 mg by mouth in the morning. 12.5 mg in the PM    [provider]  dapagliflozin propanediol (FARXIGA) 10 MG TABS tablet Take 1 tablet (10 mg total) by mouth daily before breakfast. 04/20/22   Larey Dresser, MD  glipiZIDE (GLUCOTROL) 5 MG tablet Take 1 tablet (5 mg total) by mouth daily before breakfast. 03/09/22 03/09/23  Domenic Polite, MD  hydrALAZINE (APRESOLINE) 50 MG tablet Take 1 tablet (50 mg total) by mouth 3 (three) times daily. 06/16/22   Larey Dresser, MD  Insulin Pen Needle  30G X 5 MM MISC New needle with every insulin adminsration 09/18/14   Verlee Monte, MD  isosorbide mononitrate (IMDUR) 60 MG 24 hr tablet Take 1 tablet (60 mg total) by mouth daily. 06/16/22 06/16/23  Larey Dresser, MD  torsemide (DEMADEX) 20 MG tablet Take 4 tablets (80 mg total) by mouth in the morning AND 2 tablets (40 mg total) every evening. 06/16/22   Larey Dresser, MD    Inpatient Medications:  ***  Allergies: No Known Allergies  Social History   Socioeconomic History   Marital status: Single    Spouse name: Not on file   Number of children: Not on file   Years of education: Not on file   Highest education level: Not on file   Occupational History   Not on file  Tobacco Use   Smoking status: Never   Smokeless tobacco: Not on file  Vaping Use   Vaping Use: Never used  Substance and Sexual Activity   Alcohol use: No    Alcohol/week: 0.0 standard drinks of alcohol   Drug use: No   Sexual activity: Not on file  Other Topics Concern   Not on file  Social History Narrative   Not on file   Social Determinants of Health   Financial Resource Strain: Not on file  Food Insecurity: Not on file  Transportation Needs: Not on file  Physical Activity: Not on file  Stress: Not on file  Social Connections: Not on file  Intimate Partner Violence: Not on file     Family History  Problem Relation Age of Onset   Diabetes Mother    Hypertension Mother    Stroke Mother    Alcohol abuse Father    Cancer Maternal Grandmother    Diabetes Maternal Grandmother    Hypertension Maternal Grandmother      ROS:  Please see the history of present illness.   {ros master:[id number]}  All other systems reviewed and negative.    Physical Exam:*** There were no vitals taken for this visit. General: Well developed, well nourished male in no acute distress. Head: Normocephalic, atraumatic, sclera non-icteric, no xanthomas, nares are without discharge. EENT: normal Lymph Nodes:  none Back: without scoliosis/kyphosis***, no CVA tendersness Neck: Negative for carotid bruits. JVD not elevated. Lungs: Clear bilaterally to auscultation without wheezes, rales, or rhonchi. Breathing is unlabored. Heart: RRR with S1 S2. No*** ***/6 systolic*** murmur , rubs, or gallops appreciated. Abdomen: Soft, non-tender, non-distended with normoactive bowel sounds. No hepatomegaly. No rebound/guarding. No obvious abdominal masses. Msk:  Strength and tone appear normal for age. Extremities: No clubbing or cyanosis. No*** ***+*** edema.  Distal pedal pulses are 2+ and equal bilaterally. Skin: Warm and Dry Neuro: Alert and oriented X 3. CN III-XII  intact Grossly normal sensory and motor function . Psych:  Responds to questions appropriately with a normal affect.      Labs: Cardiac Enzymes No results for input(s): "CKTOTAL", "CKMB", "TROPONINI" in the last 72 hours. CBC Lab Results  Component Value Date   WBC 8.1 03/25/2022   HGB 13.6 07/01/2022   HCT 40.0 07/01/2022   MCV 71.2 (L) 03/25/2022   PLT 510 (H) 03/25/2022   PROTIME: No results for input(s): "LABPROT", "INR" in the last 72 hours. Chemistry No results for input(s): "NA", "K", "CL", "CO2", "BUN", "CREATININE", "CALCIUM", "PROT", "BILITOT", "ALKPHOS", "ALT", "AST", "GLUCOSE" in the last 168 hours.  Invalid input(s): "LABALBU" Lipids Lab Results  Component Value Date   CHOL 206 (H) 10/09/2015  HDL 34 (L) 10/09/2015   LDLCALC 119 10/09/2015   TRIG 263 (H) 10/09/2015   BNP No results found for: "PROBNP" Thyroid Function Tests: No results for input(s): "TSH", "T4TOTAL", "T3FREE", "THYROIDAB" in the last 72 hours.  Invalid input(s): "FREET3"         EKG: ***   Assessment and Plan: *** Virl Axe

## 2022-08-03 ENCOUNTER — Telehealth (HOSPITAL_COMMUNITY): Payer: Self-pay | Admitting: *Deleted

## 2022-08-03 NOTE — Telephone Encounter (Signed)
Auth approved for PET auth #[id number]

## 2022-08-05 ENCOUNTER — Telehealth (HOSPITAL_COMMUNITY): Payer: Self-pay | Admitting: Emergency Medicine

## 2022-08-05 DIAGNOSIS — I209 Angina pectoris, unspecified: Secondary | ICD-10-CM

## 2022-08-06 ENCOUNTER — Other Ambulatory Visit: Payer: Self-pay | Admitting: Cardiovascular Disease

## 2022-08-06 ENCOUNTER — Telehealth (HOSPITAL_COMMUNITY): Payer: Self-pay | Admitting: Emergency Medicine

## 2022-08-06 DIAGNOSIS — I5022 Chronic systolic (congestive) heart failure: Secondary | ICD-10-CM

## 2022-08-06 DIAGNOSIS — R079 Chest pain, unspecified: Secondary | ICD-10-CM

## 2022-08-06 NOTE — Progress Notes (Signed)
Consent for cardiac PET stress test administratively signed. See documentation for primary cardiologist, Dr. Aundra Dubin for full details.   Jeffery Bells T. Audie Box, MD, [place]  [address] [place],  [address] [phone]  11:58 AM

## 2022-08-06 NOTE — Telephone Encounter (Signed)
Unable to leave vm since vm box is full  Laparis Durrett RN Navigator Cardiac Imaging [facility] [phone] Office  [phone] Cell  

## 2022-08-10 ENCOUNTER — Encounter (HOSPITAL_COMMUNITY): Admission: RE | Admit: 2022-08-10 | Payer: 59 | Source: Ambulatory Visit

## 2022-08-10 NOTE — Telephone Encounter (Signed)
PET attestation 

## 2022-08-10 NOTE — Telephone Encounter (Signed)
See if it's done now.  Think I figured it out.

## 2022-08-12 ENCOUNTER — Encounter (HOSPITAL_COMMUNITY): Payer: 59 | Admitting: Cardiology

## 2022-08-18 ENCOUNTER — Ambulatory Visit: Payer: 59 | Admitting: Family

## 2022-08-25 ENCOUNTER — Institutional Professional Consult (permissible substitution): Payer: Self-pay | Admitting: Internal Medicine

## 2022-08-25 DIAGNOSIS — I5022 Chronic systolic (congestive) heart failure: Secondary | ICD-10-CM

## 2022-09-05 IMAGING — CT CT ABD-PELV W/ CM
2 of 4 series · 16 of 46 positions shown, 18 images · IV contrast (APPLIED)
Comparison: None Available.

CLINICAL DATA: Sepsis.

EXAM:
CT ABDOMEN AND PELVIS WITH CONTRAST
TECHNIQUE: Multidetector CT imaging of the abdomen and pelvis was performed
using the standard protocol following bolus administration of
intravenous contrast.

[Series 2: abd pel w · axial · 0.78mm/px · z∈[+871,+1341]mm · 13 of 104 slices shown, 15 images]
[im 5/104  soft-tissue]
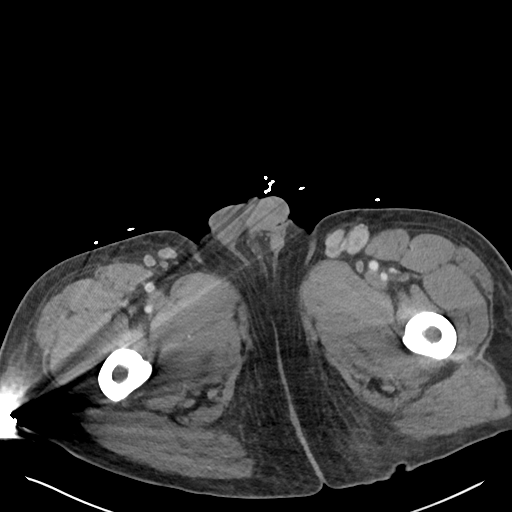
[im 5/104  bone]
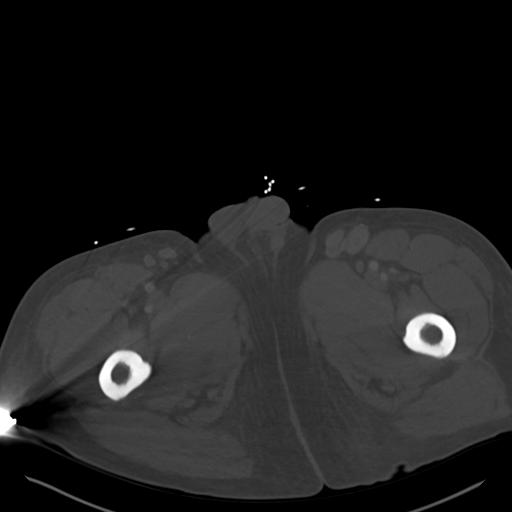
[im 13/104  soft-tissue]
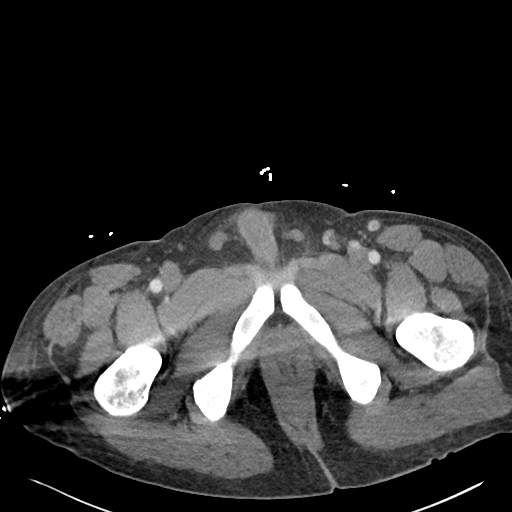
[im 22/104  soft-tissue]
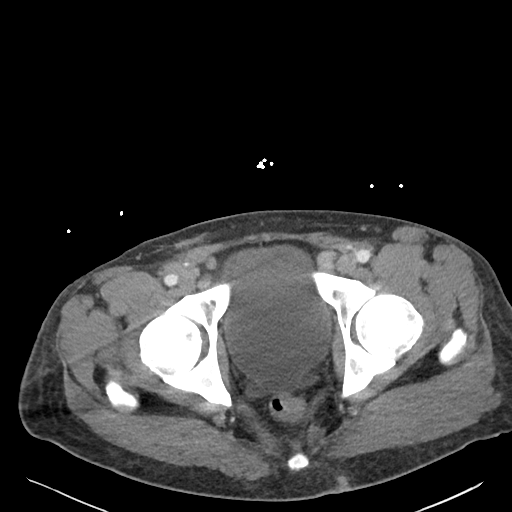
[im 31/104  soft-tissue]
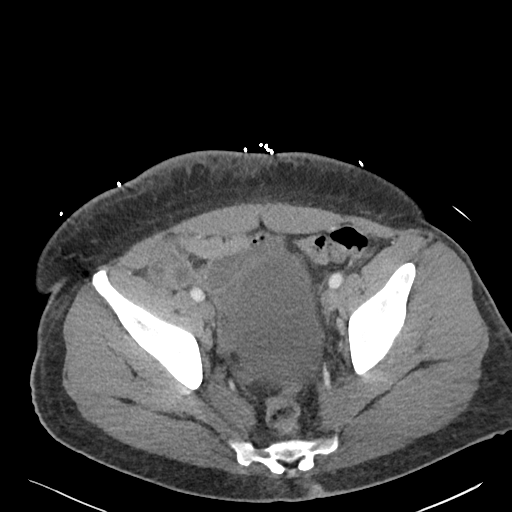
[im 35/104  soft-tissue]
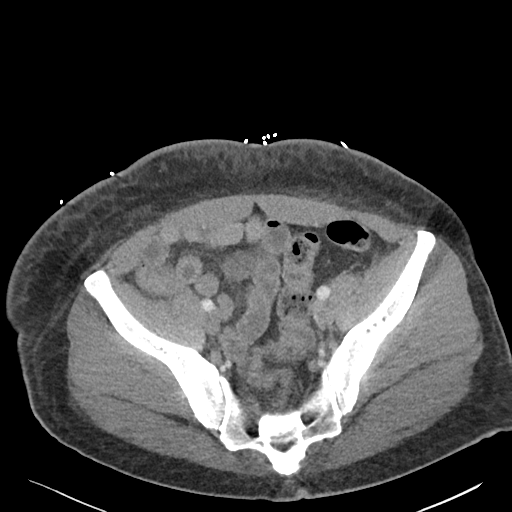
[im 43/104  soft-tissue]
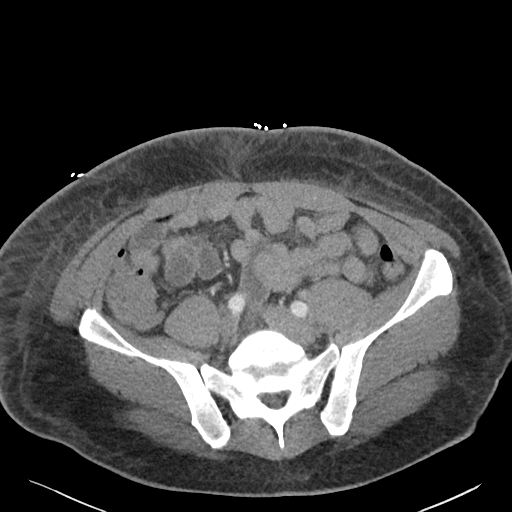
[im 52/104  soft-tissue]
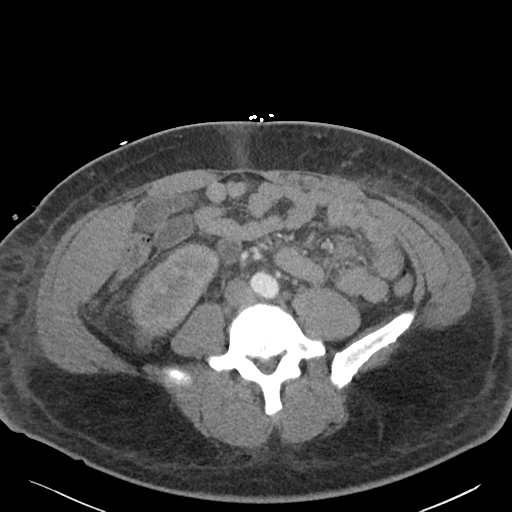
[im 61/104  soft-tissue]
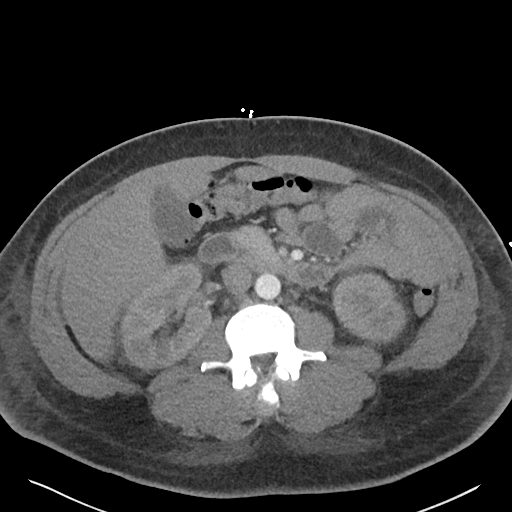
[im 69/104  soft-tissue]
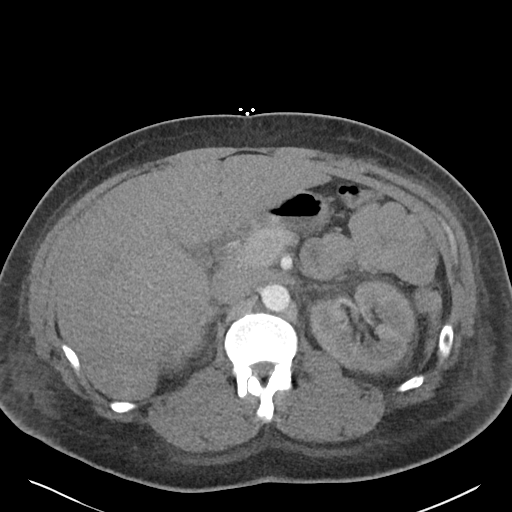
[im 69/104  bone]
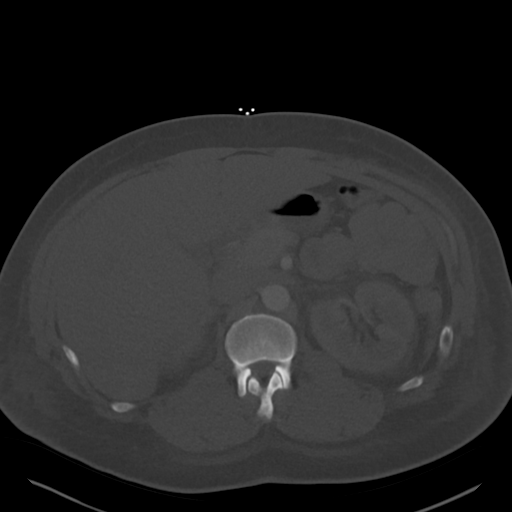
[im 73/104  soft-tissue]
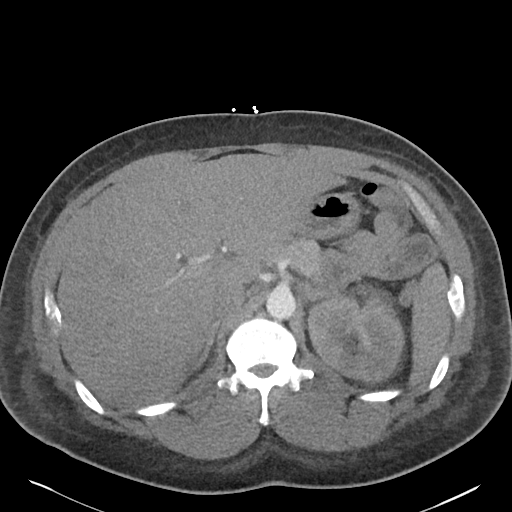
[im 82/104  soft-tissue]
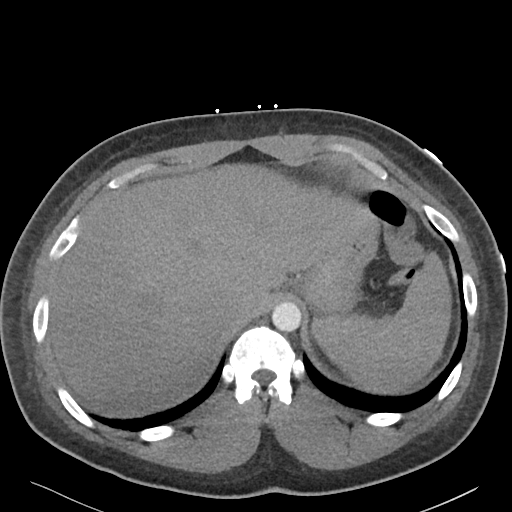
[im 91/104  soft-tissue]
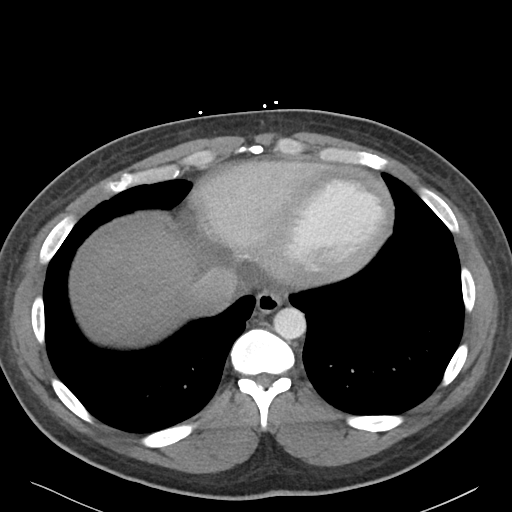
[im 99/104  soft-tissue]
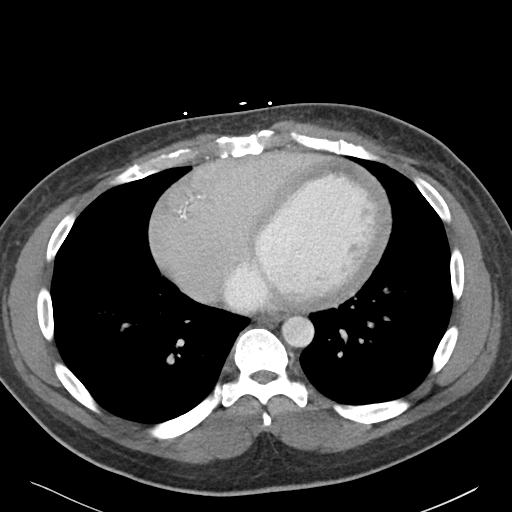

[Series 5: coronal · coronal · 0.85mm/px · 3 of 105 slices shown]
[im 35/105  soft-tissue]
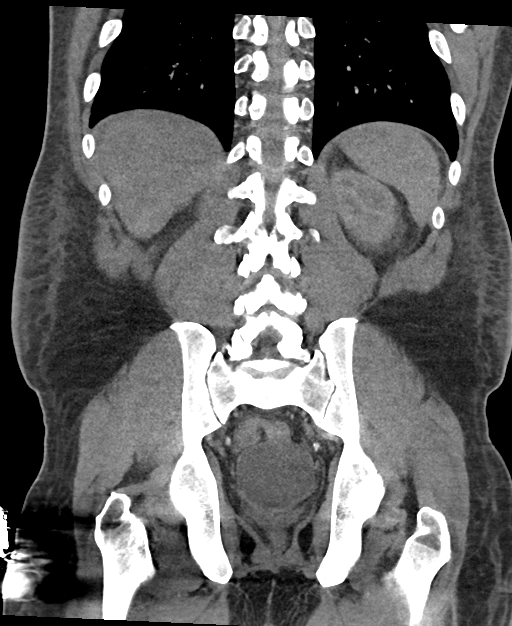
[im 47/105  soft-tissue]
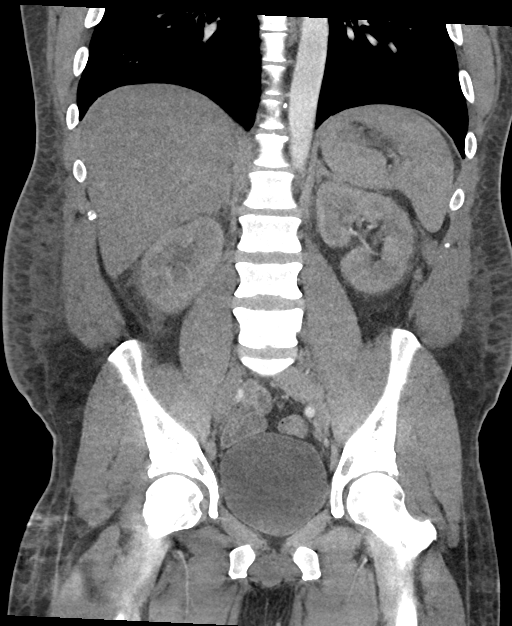
[im 58/105  soft-tissue]
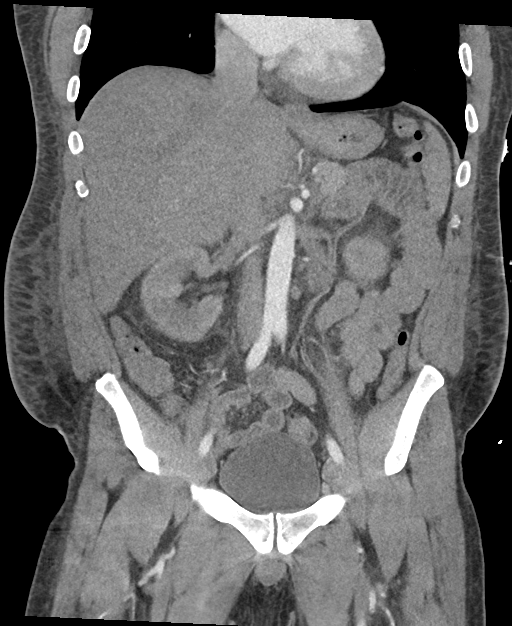

[16 of 46 positions shown; findings below may reference images not displayed]

RADIATION DOSE REDUCTION: This exam was performed according to the
departmental dose-optimization program which includes automated
exposure control, adjustment of the mA and/or kV according to
patient size and/or use of iterative reconstruction technique.

CONTRAST:  80mL OMNIPAQUE IOHEXOL 300 MG/ML  SOLN
FINDINGS: Lower chest: Lung bases are clear.  Mild cardiomegaly.

Hepatobiliary: Liver, gallbladder and biliary tree are normal.

Pancreas: Normal.

Spleen: Normal.

Adrenals/Urinary Tract: Adrenal glands are normal. Kidneys are
normal in size. 2 mm stone over the lower pole left kidney. No
significant hydronephrosis. Minimal symmetric perinephric fluid.
Ureters and bladder are normal.

Stomach/Bowel: Stomach and small bowel are normal. Appendix is
normal. Colon is normal.

Vascular/Lymphatic: Minimal calcified plaque at the aortic
bifurcation. Aorta is normal caliber. Remaining vascular structures
are unremarkable. Few minimally prominent left superficial inguinal
lymph nodes. Few small periaortic lymph nodes.

Reproductive: Normal.

Other: No focal inflammatory process. Minimal diffuse subcutaneous
edema over the abdomen/pelvis.

Musculoskeletal: No focal abnormality.
IMPRESSION: 1. No acute findings in the abdomen/pelvis.
2. 2 mm nonobstructing left renal stone.
3. Aortic atherosclerosis.

Aortic Atherosclerosis ([id number]-BSD.D).

## 2022-09-05 IMAGING — DX DG CHEST 2V
2 series · 2 of 2 positions shown · non-contrast
Comparison: None

CLINICAL DATA: Left leg swelling and pain.  History of CHF.

EXAM:
CHEST - 2 VIEW

[chest pa]
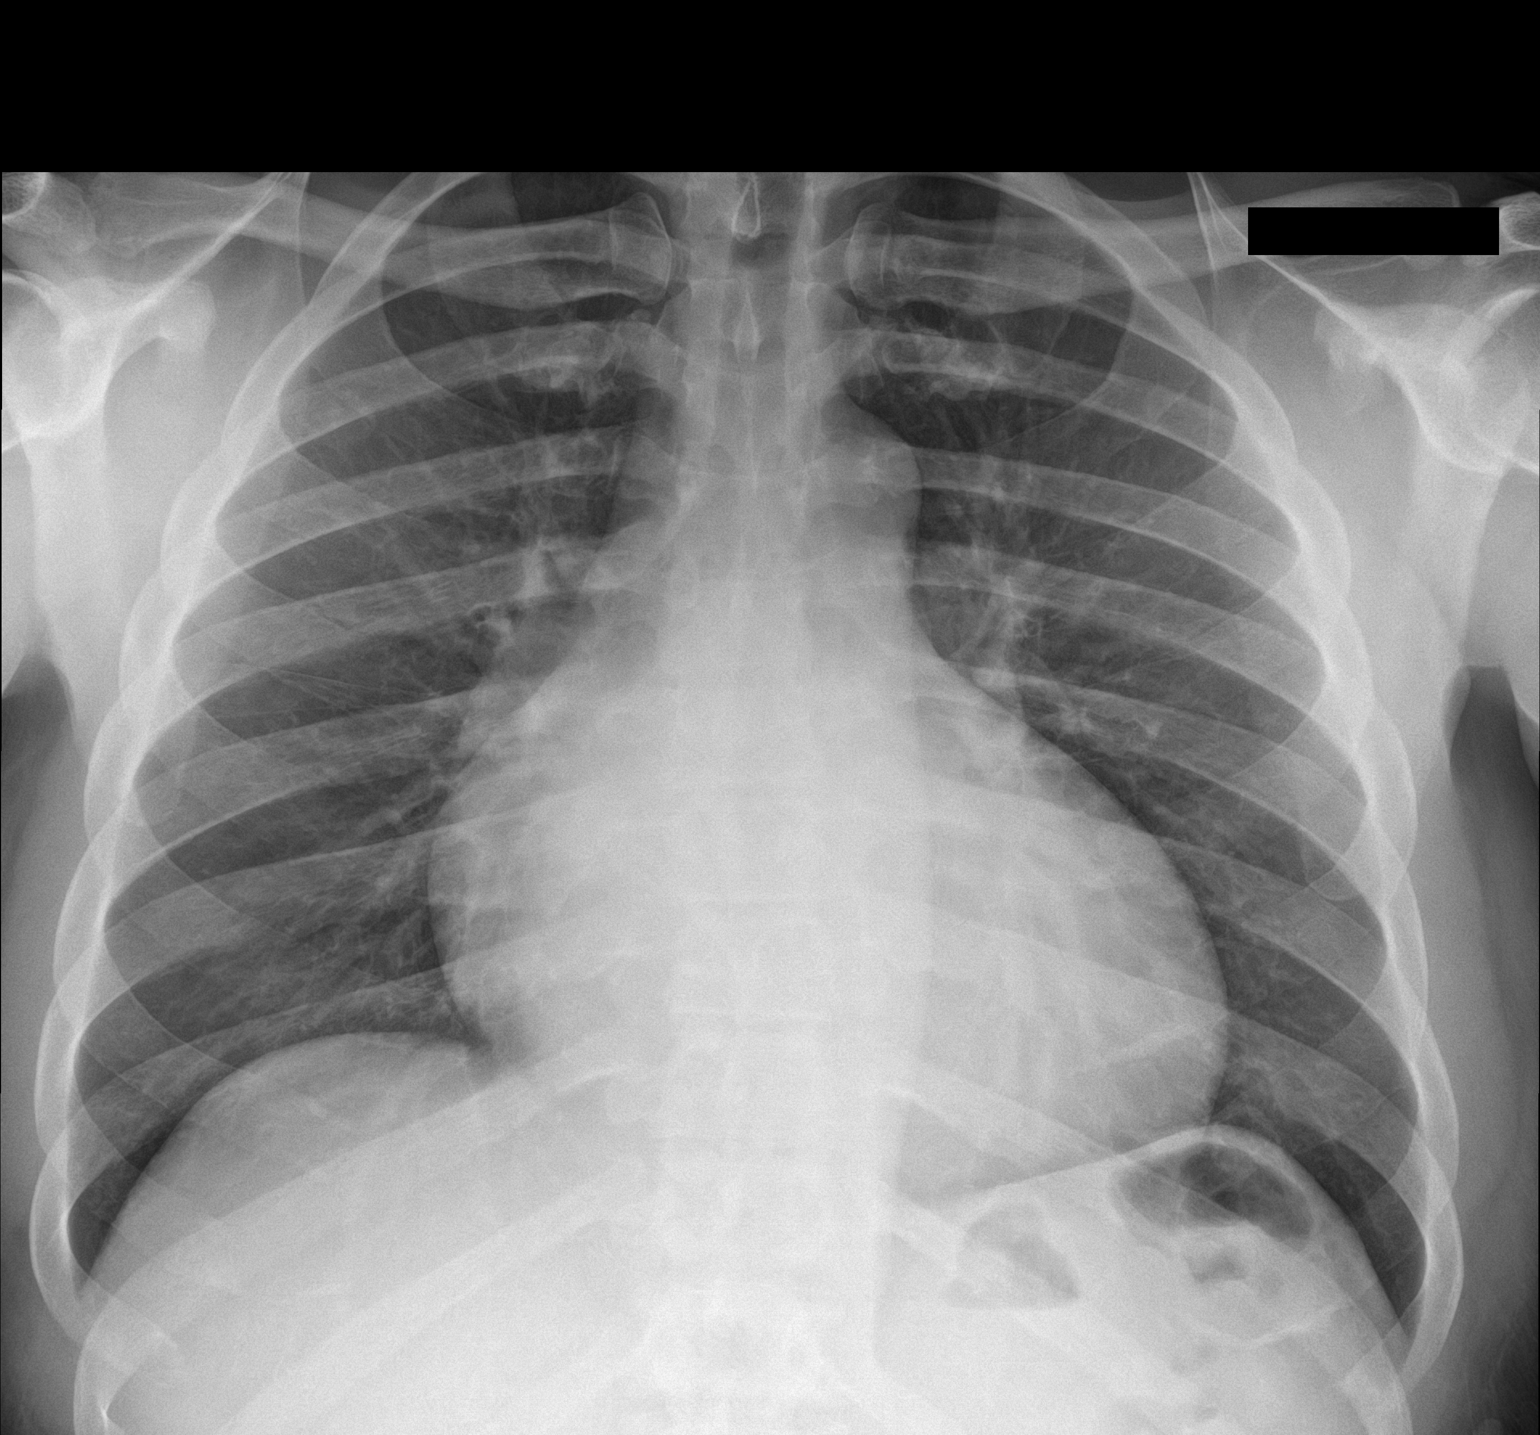

[chest lat]
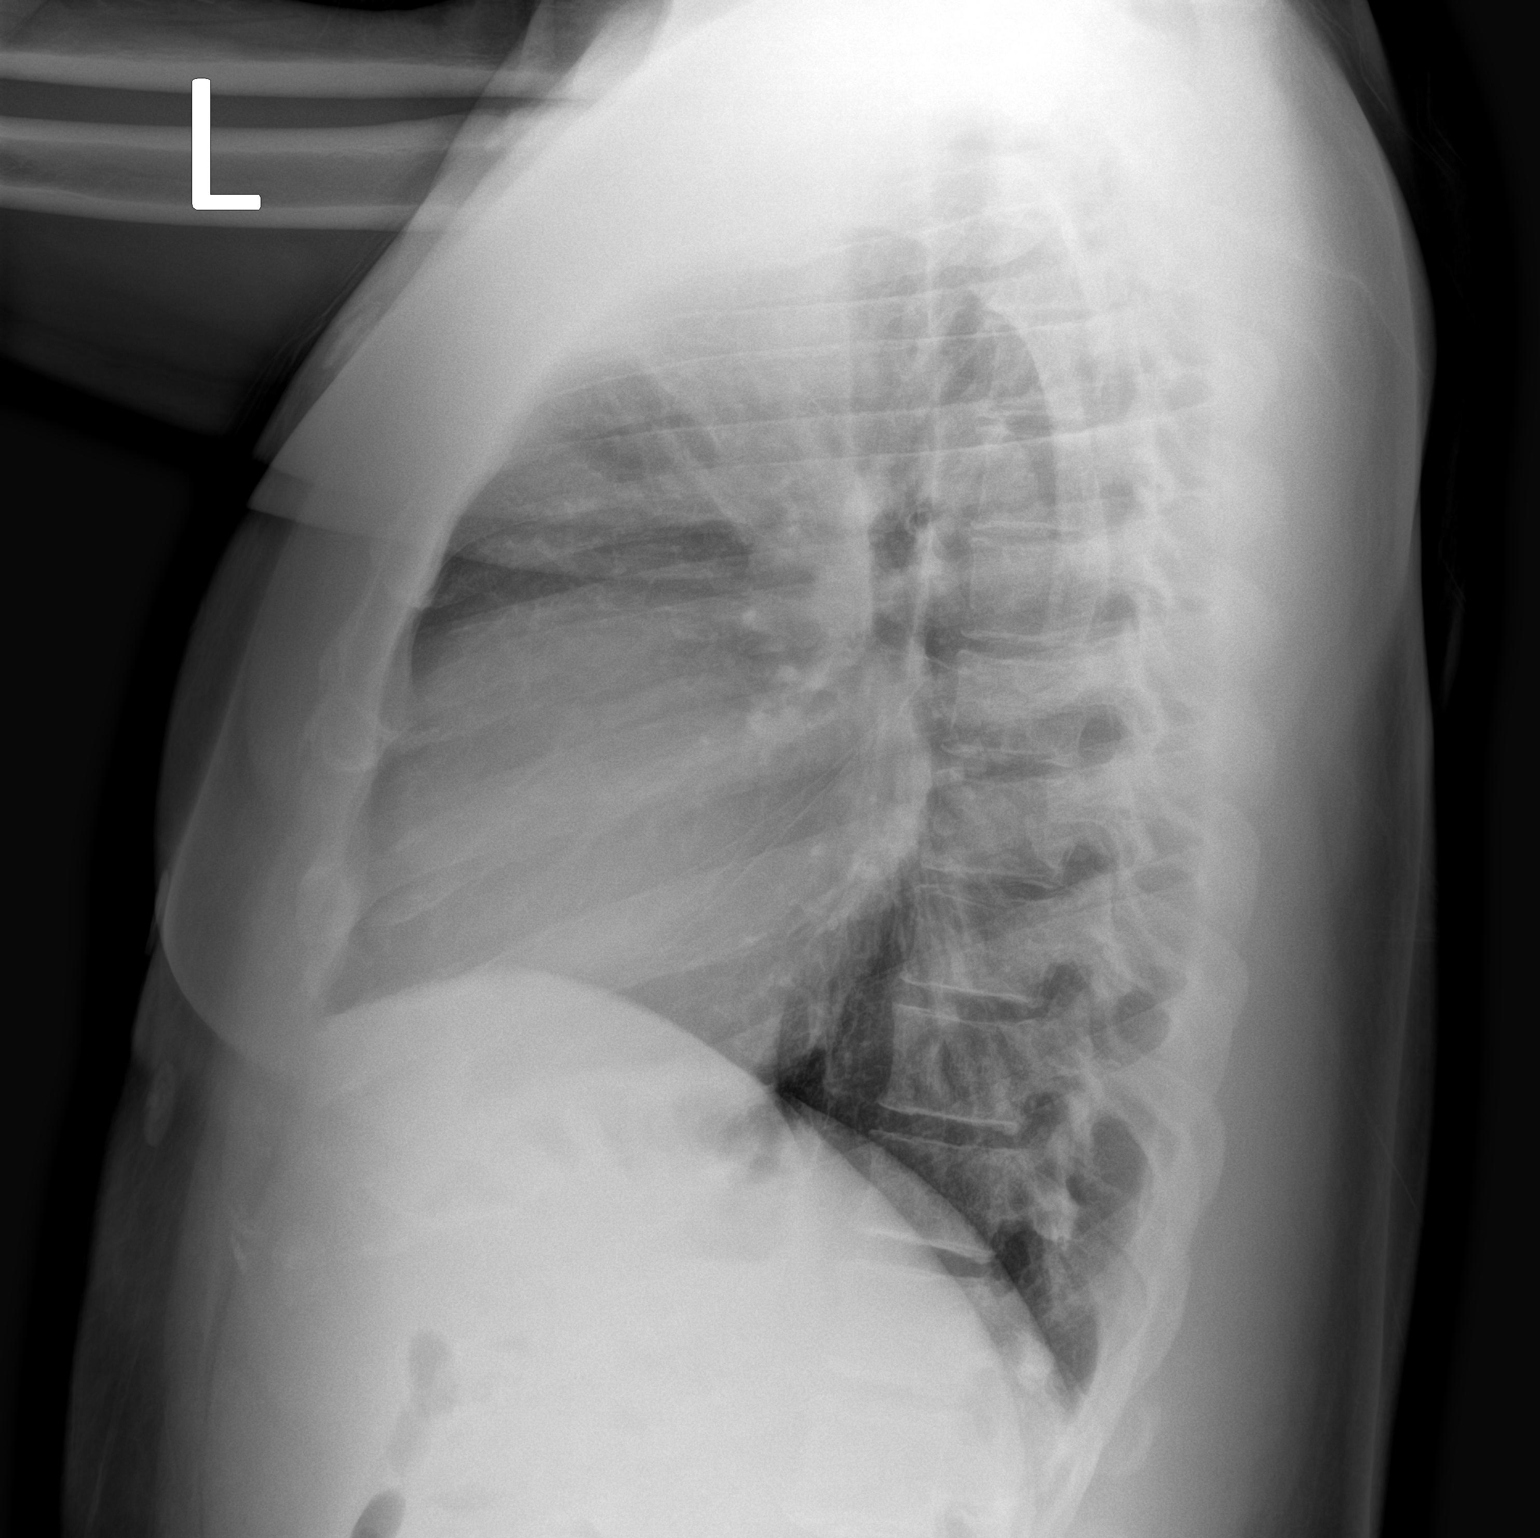

[2 of 2 positions shown; findings below may reference images not displayed]

FINDINGS: Heart size and mediastinal contour scratch set heart size is
enlarged. No signs of pleural effusion or edema. No airspace
opacities identified. Faint nodular density within the projection of
the right upper lobe may reflect something external to the patient.
Visualized osseous structures are unremarkable.
IMPRESSION: 1. Cardiac enlargement.
2. No heart failure.
3. Faint nodular density within the projection of the right upper
lobe may reflect something external to the patient. Correlation with
physical exam findings. In the absence of any external device over
the right upper chest consider further evaluation with dedicated CT
of the chest.

## 2022-09-07 ENCOUNTER — Encounter: Payer: Self-pay | Admitting: Internal Medicine

## 2022-09-10 ENCOUNTER — Telehealth (HOSPITAL_COMMUNITY): Payer: Self-pay | Admitting: *Deleted

## 2022-09-10 NOTE — Telephone Encounter (Signed)
Attempted to call patient regarding upcoming cardiac PET appointment and remind patient to hold his Imdur and caffeine for the test. Left message on voicemail with name and callback number  Larey Brick RN Navigator Cardiac Imaging [facility] [phone] Office [phone] Cell

## 2022-09-13 ENCOUNTER — Telehealth (HOSPITAL_COMMUNITY): Payer: Self-pay | Admitting: *Deleted

## 2022-09-13 NOTE — Telephone Encounter (Signed)
Reaching out to patient regarding his upcoming cardiac PET scan instructions.  Patient states he needs to reschedule his appointment due to a death in the family.  Informed him that the scheduler will reach out to reschedule him. He verbalized understanding.  Larey Brick RN Navigator Cardiac Imaging [facility] [phone] Office [phone] Cell

## 2022-09-14 ENCOUNTER — Encounter (HOSPITAL_COMMUNITY): Admission: RE | Admit: 2022-09-14 | Payer: 59 | Source: Ambulatory Visit

## 2022-09-14 ENCOUNTER — Other Ambulatory Visit (HOSPITAL_COMMUNITY): Payer: 59

## 2022-12-10 ENCOUNTER — Encounter (HOSPITAL_COMMUNITY): Payer: Self-pay | Admitting: Cardiology

## 2022-12-24 ENCOUNTER — Telehealth (HOSPITAL_COMMUNITY): Payer: Self-pay | Admitting: Cardiology

## 2022-12-24 NOTE — Telephone Encounter (Signed)
Just an FYI. We have made several attempts to contact this patient including sending a letter to schedule or reschedule their Cardiac PET Scan. We will be removing the patient from the [facility].   12/10/22 MAILED LETTER [facility] made several attempts to contact pt and left vms  Pt stated he's on vacation and will call when he returns to make appt; LW12/18'@1417'$    If patient calls to schedule we will reinstate the order. Thank you.    Thank you

## 2022-12-27 ENCOUNTER — Other Ambulatory Visit (HOSPITAL_COMMUNITY): Payer: Self-pay | Admitting: Cardiology

## 2023-04-09 ENCOUNTER — Other Ambulatory Visit (HOSPITAL_COMMUNITY): Payer: Self-pay | Admitting: Family Medicine

## 2023-04-16 ENCOUNTER — Other Ambulatory Visit (HOSPITAL_COMMUNITY): Payer: Self-pay | Admitting: Cardiology

## 2023-07-13 ENCOUNTER — Other Ambulatory Visit (HOSPITAL_COMMUNITY): Payer: Self-pay | Admitting: Family Medicine

## 2023-07-13 ENCOUNTER — Other Ambulatory Visit (HOSPITAL_COMMUNITY): Payer: Self-pay | Admitting: Cardiology

## 2023-08-09 ENCOUNTER — Telehealth (HOSPITAL_COMMUNITY): Payer: Self-pay | Admitting: Cardiology

## 2023-08-09 NOTE — Telephone Encounter (Signed)
Patient left [facility] triage line with questions regarding second dose of metolazone  No answer unable to LM voicemail full

## 2023-08-18 ENCOUNTER — Ambulatory Visit (HOSPITAL_COMMUNITY)
Admission: RE | Admit: 2023-08-18 | Discharge: 2023-08-18 | Disposition: A | Discharge status: Home / Self Care | Payer: BC Managed Care – PPO | Source: Ambulatory Visit | Attending: Physician Assistant | Admitting: Physician Assistant

## 2023-08-18 ENCOUNTER — Encounter (HOSPITAL_COMMUNITY): Payer: Self-pay

## 2023-08-18 VITALS — BP 110/70 | HR 106 | Wt 240.0 lb

## 2023-08-18 DIAGNOSIS — I340 Nonrheumatic mitral (valve) insufficiency: Secondary | ICD-10-CM | POA: Diagnosis not present

## 2023-08-18 DIAGNOSIS — I10 Essential (primary) hypertension: Secondary | ICD-10-CM

## 2023-08-18 DIAGNOSIS — I5022 Chronic systolic (congestive) heart failure: Secondary | ICD-10-CM | POA: Diagnosis present

## 2023-08-18 DIAGNOSIS — Z89429 Acquired absence of other toe(s), unspecified side: Secondary | ICD-10-CM | POA: Diagnosis not present

## 2023-08-18 DIAGNOSIS — Z794 Long term (current) use of insulin: Secondary | ICD-10-CM | POA: Insufficient documentation

## 2023-08-18 DIAGNOSIS — E785 Hyperlipidemia, unspecified: Secondary | ICD-10-CM | POA: Diagnosis not present

## 2023-08-18 DIAGNOSIS — N1832 Chronic kidney disease, stage 3b: Secondary | ICD-10-CM

## 2023-08-18 DIAGNOSIS — I5023 Acute on chronic systolic (congestive) heart failure: Secondary | ICD-10-CM

## 2023-08-18 DIAGNOSIS — G4733 Obstructive sleep apnea (adult) (pediatric): Secondary | ICD-10-CM | POA: Diagnosis not present

## 2023-08-18 DIAGNOSIS — I130 Hypertensive heart and chronic kidney disease with heart failure and stage 1 through stage 4 chronic kidney disease, or unspecified chronic kidney disease: Secondary | ICD-10-CM | POA: Diagnosis not present

## 2023-08-18 DIAGNOSIS — E1122 Type 2 diabetes mellitus with diabetic chronic kidney disease: Secondary | ICD-10-CM | POA: Diagnosis not present

## 2023-08-18 LAB — CBC
HCT: 42.5 % (ref 39.0–52.0)
Hemoglobin: 12.4 g/dL — ABNORMAL LOW (ref 13.0–17.0)
MCH: 18.2 pg — ABNORMAL LOW (ref 26.0–34.0)
MCHC: 29.2 g/dL — ABNORMAL LOW (ref 30.0–36.0)
MCV: 62.3 fL — ABNORMAL LOW (ref 80.0–100.0)
Platelets: 394 10*3/uL (ref 150–400)
RBC: 6.82 MIL/uL — ABNORMAL HIGH (ref 4.22–5.81)
RDW: 22.1 % — ABNORMAL HIGH (ref 11.5–15.5)
WBC: 7.4 10*3/uL (ref 4.0–10.5)
nRBC: 0 % (ref 0.0–0.2)

## 2023-08-18 LAB — COMPREHENSIVE METABOLIC PANEL
ALT: 38 U/L (ref 0–44)
AST: 35 U/L (ref 15–41)
Albumin: 2.4 g/dL — ABNORMAL LOW (ref 3.5–5.0)
Alkaline Phosphatase: 79 U/L (ref 38–126)
Anion gap: 13 (ref 5–15)
BUN: 121 mg/dL — ABNORMAL HIGH (ref 6–20)
CO2: 25 mmol/L (ref 22–32)
Calcium: 9.1 mg/dL (ref 8.9–10.3)
Chloride: 95 mmol/L — ABNORMAL LOW (ref 98–111)
Creatinine, Ser: 2.69 mg/dL — ABNORMAL HIGH (ref 0.61–1.24)
GFR, Estimated: 31 mL/min — ABNORMAL LOW (ref >60)
Glucose, Bld: 238 mg/dL — ABNORMAL HIGH (ref 70–99)
Potassium: 3.5 mmol/L (ref 3.5–5.1)
Sodium: 133 mmol/L — ABNORMAL LOW (ref 135–145)
Total Bilirubin: 1.3 mg/dL — ABNORMAL HIGH (ref 0.3–1.2)
Total Protein: 7 g/dL (ref 6.5–8.1)

## 2023-08-18 LAB — BRAIN NATRIURETIC PEPTIDE: B Natriuretic Peptide: 4064.5 pg/mL — ABNORMAL HIGH (ref 0.0–100.0)

## 2023-08-18 MED ORDER — ISOSORBIDE MONONITRATE ER 30 MG PO TB24: 30.0000 mg | ORAL_TABLET | Freq: Every day | ORAL | 5 refills | Status: DC

## 2023-08-18 MED ORDER — POTASSIUM CHLORIDE CRYS ER 20 MEQ PO TBCR: EXTENDED_RELEASE_TABLET | ORAL | 5 refills | Status: DC

## 2023-08-18 MED ORDER — HYDRALAZINE HCL 50 MG PO TABS: 25.0000 mg | ORAL_TABLET | Freq: Three times a day (TID) | ORAL | Status: DC

## 2023-08-18 NOTE — Progress Notes (Signed)
ReDS Vest / Clip - 08/18/23 1500       ReDS Vest / Clip   Station Marker C    Ruler Value 31    ReDS Value Range High volume overload    ReDS Actual Value 49

## 2023-08-18 NOTE — Progress Notes (Addendum)
ADVANCED HF CLINIC CLINIC NOTE   PCP: [facility] Cardiologist: Dr. Shirlee Latch  HPI: [name] is a 37 y.o. male with past medical history of hypertension, hyperlipidemia, insulin-dependent diabetes mellitus since 37 years of age, CKD stage IIIb, OSA and chronic systolic heart failure.    He was seen 6/22 by PCP, referred to cardiology for echo. Echo 9/22 showed EF 20 to 25%, moderate to severe MR, moderate to severe TR, RVSP 60 mmHg.  He was started on GDMT. Zio placed 10/22 showing average heart rate 92, patient remained tachycardic 8.5% of total time, 3 episodes of wide-complex tachycardia with longest run 20 beats.   He saw a cardiologist at [facility], 10/22.  It was recommended he undergo outpatient Myoview, start LifeVest, then repeat echocardiogram in 3 months.  He did not follow back up. He wore the LifeVest for about a week before he discontinued it.    Admitted 5/23 to [facility] with left lower extremity pain. WBC 30.7, felt he was also slightly volume overloaded. Diuresed with IV lasix and placed on abx.  Echo showed EF 20-25%, global hypokinesis with moderate LV dilation, mod-severe MR (severe by PISA ERO), moderate TR.  He underwent RHC showing mildly elevated PCWP and normal RA pressure with mild pulmonary venous hypertension, CI 3.33 (off milrinone x 1 hour). No LHC with elevated SCr. Cardiac MRI showed >50% wall thickness subendocardial LGE in the mid anteroseptal wall, looked like a coronary disease pattern suggestive of prior MI. LVEF 15%. GDMT limited by AKI. Planned for ischemic eval in future when creatinine comes down. Discharged home, weight 264 lbs.  Echo in 8/23 showed EF 20% with severe LV dilation, moderate RV dilation with mildly decreased systolic function, mod-severe TR, mod-severe functional MR, PASP 64 mmHg, dilated IVC. TEE was done in 9/23 to assess MR, this showed EF 20-25%, LVESD 6.4 cm, PASP 69, moderately decreased RV function, moderate functional MR,  dilated IVC, severe TR.   No concerning variant on genetic testing 08/23.  Last seen for follow-up in 09/23. There was concern he may be approaching need for heart/kidney transplant.  Cardiac PET ordered but not scheduled.  CPX 10/23: severe functional limitation d/t advanced HF and morbid obesity. VE/VCO2 slope severely elevated at 45.  Did not show up for appointment with Dr. Graciela Husbands in 11/23 to discuss ICD.  Here today for overdue follow-up.  He stopped taking all of his medications except glipizide and Torsemide in August as he felt like giving up. He saw his PCP last month and was restarted on Coreg (currently taking 12.5 once daily), hydralazine (taking 50 mg daily), imdur stopped, and off farxiga. His legs have been very swollen and limit his activity. Denies dyspnea with exertion, orthopnea, PND. Reports others have noticed he's lost muscle mass.   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   Denies ETOH, tobacco and drug use.   He is going through a divorce and is in the process of moving out of his home. Has 2 children.  ReDS 49%   Review of Systems: All systems reviewed and negative except as mentioned in HPI.  PMH: 1. OSA 2. Type 2 diabetes: Since age 20.  3. HTN: First noted in high school. 4. CKD stage 3: Likely due to diabetes and HTN.  5. Hyperlipidemia 6. Chronic systolic CHF: Cardiomyopathy of uncertain etiology.  - Echo (9/22): EF 20-25%, mod-severe MR.  - Echo (5/23): EF 20-25%, moderate  LV dilation, mod-severe MR, moderate TR - Cardiac MRI (5/23): Technically difficult, LVEF 15% with severe LV dilation, RVEF 15% with severe RV dilation, >50% subendocardial LGE mid anteroseptal wall (?prior MI).  - RHC (5/23): mean RA 8, PA 58/27, mean PCWP 17, CI 3.33 - Echo (8/23): EF 20% with severe LV dilation, moderate RV dilation with mildly decreased systolic function, mod-severe TR, mod-severe functional MR, PASP  64 mmHg, dilated IVC.  - TEE (9/23): EF 20-25%, LVESD 6.4 cm, PASP 69, moderately decreased RV function, moderate functional MR, dilated IVC, severe TR.  - [employer] genetic testing: Sept9 gene mutation, unknown significance.  7. H/o diabetic foot with toe amputation.  8. Mitral regurgitation: Moderate functional MR on 9/23 TEE.  9. Tricuspid regurgitation: Severe TR on 9/23 TEE.    Current Outpatient Medications  Medication Sig Dispense Refill   acetaminophen (TYLENOL) 325 MG tablet Take 2 tablets (650 mg total) by mouth every 6 (six) hours as needed for mild pain (or Fever >/= 101).     atorvastatin (LIPITOR) 40 MG tablet Take 1 tablet (40 mg total) by mouth daily. 90 tablet 3   blood glucose meter kit and supplies Dispense based on patient and insurance preference. Use up to four times daily as directed. (FOR ICD-10 E10.9, E11.9). 1 each 0   busPIRone (BUSPAR) 15 MG tablet Take 15 mg by mouth 2 (two) times daily.     glipiZIDE (GLUCOTROL) 5 MG tablet Take by mouth 2 (two) times daily before a meal.     Insulin Pen Needle 30G X 5 MM MISC New needle with every insulin adminsration 100 each 5   metolazone (ZAROXOLYN) 2.5 MG tablet Patient takes 1 tablet by mouth once a week on Saturdays.     torsemide (DEMADEX) 20 MG tablet TAKE 4 TABLETS (80 MG TOTAL) BY MOUTH IN THE MORNING AND 2 TABLETS (40 MG TOTAL) EVERY EVENING. 540 tablet 2   glipiZIDE (GLUCOTROL) 5 MG tablet Take 1 tablet (5 mg total) by mouth daily before breakfast. 30 tablet 0   hydrALAZINE (APRESOLINE) 50 MG tablet Take 0.5 tablets (25 mg total) by mouth 3 (three) times daily.     isosorbide mononitrate (IMDUR) 30 MG 24 hr tablet Take 1 tablet (30 mg total) by mouth daily. 30 tablet 5   No current facility-administered medications for this encounter.   No Known Allergies  Social History   Socioeconomic History   Marital status: Single    Spouse name: Not on file   Number of children: Not on file   Years of education: Not on  file   Highest education level: Not on file  Occupational History   Not on file  Tobacco Use   Smoking status: Never   Smokeless tobacco: Not on file  Vaping Use   Vaping status: Never Used  Substance and Sexual Activity   Alcohol use: No    Alcohol/week: 0.0 standard drinks of alcohol   Drug use: No   Sexual activity: Not on file  Other Topics Concern   Not on file  Social History Narrative   Not on file   Social Determinants of Health   Financial Resource Strain: Low Risk  (07/14/2023)   Received from [facility]   Overall Financial Resource Strain (CARDIA)    Difficulty of Paying Living Expenses: Not hard at all  Food Insecurity: No Food Insecurity (07/14/2023)   Received from [facility]   Hunger Vital Sign    Worried About Running Out of Food in  the Last Year: Never true    Ran Out of Food in the Last Year: Never true  Transportation Needs: No Transportation Needs (07/14/2023)   Received from [facility] - Transportation    Lack of Transportation (Medical): No    Lack of Transportation (Non-Medical): No  Physical Activity: Sufficiently Active (10/21/2020)   Received from [facility], [facility]   Exercise Vital Sign    Days of Exercise per Week: 4 days    Minutes of Exercise per Session: 60 min  Stress: Stress Concern Present (10/21/2020)   Received from [facility] - Occupational Stress Questionnaire    Feeling of Stress : To some extent  Social Connections: Unknown (03/04/2022)   Received from [facility], [facility]   Social Network    Social Network: Not on file  Intimate Partner Violence: Unknown (01/28/2022)   [facility] from [facility], [facility]   HITS    Physically Hurt: Not on file    Insult or Talk Down To: Not on file    Threaten Physical Harm: Not on file    Scream or Curse: Not on file   Family History  Problem Relation Age of Onset   Diabetes Mother     Hypertension Mother    Stroke Mother    Alcohol abuse Father    Cancer Maternal Grandmother    Diabetes Maternal Grandmother    Hypertension Maternal Grandmother    His mother also had heart failure in her 61s and passed away from the heart failure.    BP 110/70   Pulse (!) 106   Wt 108.9 kg (240 lb)   SpO2 96%   BMI 30.81 kg/m   Wt Readings from Last 3 Encounters:  08/18/23 108.9 kg (240 lb)  07/07/22 111.3 kg (245 lb 6.4 oz)  07/01/22 113.4 kg (250 lb)   PHYSICAL EXAM: General:  Fatigued appearing.  HEENT: normal Neck: supple. JVP 8 cm. Carotids 2+ bilat; no bruits. No lymphadenopathy or thryomegaly appreciated. Cor: PMI nondisplaced. Regular rate & rhythm. No rubs, gallops, 3/6 HSM best heard at apex Lungs: clear Abdomen: soft, nontender, + edema Extremities: no cyanosis, clubbing, rash, 3+ edema to waist, lower extremities are cool Neuro: alert & orientedx3, cranial nerves grossly intact. moves all 4 extremities w/o difficulty. Affect pleasant   ASSESSMENT & PLAN: 1. Acute on chronic systolic CHF: Cardiomyopathy of uncertain etiology.  Echo in 9/22 with EF 20-25%, moderate-severe MR, moderate-severe TR.  Echo 05/23 EF 20-25%, global hypokinesis with moderate LV dilation, mod-severe MR (severe by PISA ERO), moderate TR.  He has had HTN since his teens, could be due to long-standing poorly controlled HTN. With DM/HTN/hyperlipidemia x years, CAD must be a consideration though no chest pain. Prior viral myocarditis is possible as well. Additionally, his mother died of CHF in her early 10s.  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  cMRI limited study, difficult images due to problems with breath-holding. >50% wall thickness subendocardial LGE in the mid anteroseptal wall, looks like a coronary disease pattern suggestive of prior MI though does not seem extensive enough to cause his degree of LV dysfunction. LVEF  15% on cMRI.  Echo in 8/23 showed EF 20% with severe LV dilation, moderate RV dilation with mildly decreased systolic function, mod-severe TR, mod-severe functional MR, PASP 64 mmHg, dilated IVC.  Narrow QRS, not CRT candidate. Missed appointment with EP  to discuss ICD. CPX 09/23: severe functional limitation d/t advanced HF and obesity, peak VO2 15, VE/VCO2 slope 45. - Reports NYHA class II symptoms, but worry he is downplaying symptoms. Massively overloaded on exam and extremities are cool. I am worried about low-output HF. Recommended he consider admission for IV diuresis and possible inotrope support. He declined and states that he needs to move out of his current home to new housing. - Will have him use 80 mg Furoscix tomorrow am with 2.5 mg metolazone. Tomorrow afternoon take 80 mg Torsemide. 10/26 use 80 mg Furoscix in am then 80 mg Torsemide in afternoon. Then resume - Stop carvedilol d/t concern for low-output - Switch hydralazine to 25 mg TID. Add back Imdur at 30 mg daily - Add Farxiga next - Will need repeat echo soon - Worry that he is now end-stage. Would ideally consider referral for heart/kidney transplant but he has not been compliant with follow-up or medications. We had a long discussion about this today. 2. 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  - BMET today. - Refer to Nephrology 3. Type 2 diabetes: Early onset.  - Last A1c was 7.9 - On glipizide - Restart Farxiga next 4. HTN: Early onset in teens. Controlled on current meds. - GDMT per above  5. Hyperlipidemia: Continue atorvastatin.  6. Diabetic foot s/p toe amputation.  7. Mitral regurgitation: TEE in 9/23 showed moderate functional MR, do not think he would benefit from Mitraclip.   8. OSA: Needs CPAP, has not been able to tolerate. PCP referred him to sleep medicine to discuss devices to manage.  Follow-up: 1 week to assess volume, may need admission. He was instructed to  present to ED over the weekend if he feels worse.  FINCH, LINDSAY N. 08/18/2023  Patient seen and examined with the above-signed Advanced Practice Provider and/or Housestaff. I personally reviewed laboratory data, imaging studies and relevant notes. I independently examined the patient and formulated the important aspects of the plan. I have edited the note to reflect any of my changes or salient points. I have personally discussed the plan with the patient and/or family.  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 Has struggled with compliance and f/u.   Presents today with progressive NYHA IIIb-IV HF with marked volume overload. Not responding well to po diuretics.   General:  Weak appearing. No resp difficulty HEENT: normal Neck: supple. JVP 8-9. Carotids 2+ bilat; no bruits. No lymphadenopathy or thryomegaly appreciated. Cor: Regular rate & rhythm. 2/6 MR Lungs: clear Abdomen: soft, nontender, + distended. No hepatosplenomegaly. No bruits or masses. Good bowel sounds. Extremities: no cyanosis, clubbing, rash, 3+ edema cool Neuro: alert & orientedx3, cranial nerves grossly intact. moves all 4 extremities w/o difficulty. Affect pleasant  He has marked volume overload and likely low output. Suggested admission for diuresis and RHC but he says he is unable to be admitted due to other commitments. Will stop b-blocker as above and make diuretic changes as above. Told him to come to ER if getting worse. Will see him back in Clinic next week.   I spent a total > 45 minutes today: 1) reviewing the patient's medical records including previous charts, labs and recent notes from other providers; 2) examining the patient and counseling them on their medical issues/explaining the plan of care; 3) adjusting meds as needed and 4) ordering lab work or other needed tests.   Arvilla Meres, MD  11:56 PM

## 2023-08-18 NOTE — Patient Instructions (Addendum)
Labs done today. We will contact you only if your labs are abnormal.  STOP taking Carvedilol   DECREASE Hydralazine to 25mg  (1/2 tablet) by mouth 3 times daily.  DECREASE Imdur to 30mg  (1 tablet) by mouth daily.   TOMORROW USE 1 FUROSCIX BOX, TAKE METOLAZONE 2.5MG  (1 TABLET) BY MOUTH, TAKE TORSEMIDE 80MG  (4 TABLETS) TOMORROW AFTERNOON.    SATURDAY USE 1 FUROSCIX BOX, TAKE TORSEMIDE 80MG  (4 TABLETS) IN THE EVENING.   No other medication changes were made. Please continue all current medications as prescribed.  You have been referred to [facility], they will contact you to schedule an appointment.   Your physician recommends that you schedule a follow-up appointment in: 1 week  If you have any questions or concerns before your next appointment please send [place] a message through [place] or call our office at [phone].    TO LEAVE A MESSAGE FOR THE NURSE SELECT OPTION 2, PLEASE LEAVE A MESSAGE INCLUDING: YOUR NAME DATE OF BIRTH CALL BACK NUMBER REASON FOR CALL**this is important as we prioritize the call backs  YOU WILL RECEIVE A CALL BACK THE SAME DAY AS LONG AS YOU CALL BEFORE 4:00 PM   Do the following things EVERYDAY: Weigh yourself in the morning before breakfast. Write it down and keep it in a log. Take your medicines as prescribed Eat low salt foods--Limit salt (sodium) to 2000 mg per day.  Stay as active as you can everyday Limit all fluids for the day to less than 2 liters   At the [facility], you and your health needs are our priority. As part of our continuing mission to provide you with exceptional heart care, we have created designated Provider Care Teams. These Care Teams include your primary Cardiologist (physician) and Advanced Practice Providers (APPs- Physician Assistants and Nurse Practitioners) who all work together to provide you with the care you need, when you need it.   You may see any of the following providers on your designated  Care Team at your next follow up: Dr Arvilla Meres Dr Marca Ancona Dr. Marcos Eke, NP Robbie Lis, [place] [facility] [place], [place] Brynda Peon, NP Karle Plumber, PharmD   Please be sure to bring in all your medications bottles to every appointment.    Thank you for choosing [facility]

## 2023-08-18 NOTE — Progress Notes (Signed)
Medication Samples have been provided to the patient.  Drug name: Furoscix       Strength: 80mg /7mL        Qty: 2  LOT: [id number]  Exp.Date: 09/23/2024  Dosing instructions: use 1 box for 2 days  The patient has been instructed regarding the correct time, dose, and frequency of taking this medication, including desired effects and most common side effects.   Siddhi Dornbush R Donaldo Teegarden 4:53 PM 08/18/2023

## 2023-08-25 ENCOUNTER — Encounter (HOSPITAL_COMMUNITY): Payer: BC Managed Care – PPO

## 2023-09-05 ENCOUNTER — Other Ambulatory Visit: Payer: Self-pay

## 2023-09-05 ENCOUNTER — Ambulatory Visit (HOSPITAL_COMMUNITY)
Admission: RE | Admit: 2023-09-05 | Discharge: 2023-09-05 | Disposition: A | Discharge status: Home / Self Care | Payer: BC Managed Care – PPO | Source: Ambulatory Visit | Attending: Adult Health | Admitting: Adult Health

## 2023-09-05 ENCOUNTER — Inpatient Hospital Stay (HOSPITAL_COMMUNITY)
Admission: EM | Admit: 2023-09-05 | Discharge: 2023-09-12 | DRG: 286 | Disposition: A | Discharge status: Other Acute Inpatient Hospital | Payer: BC Managed Care – PPO | Attending: Cardiology | Admitting: Cardiology

## 2023-09-05 ENCOUNTER — Inpatient Hospital Stay (HOSPITAL_COMMUNITY): Payer: BC Managed Care – PPO

## 2023-09-05 ENCOUNTER — Encounter (HOSPITAL_COMMUNITY): Payer: Self-pay

## 2023-09-05 VITALS — BP 118/78 | HR 106 | Wt 247.6 lb

## 2023-09-05 DIAGNOSIS — I5023 Acute on chronic systolic (congestive) heart failure: Secondary | ICD-10-CM

## 2023-09-05 DIAGNOSIS — N1832 Chronic kidney disease, stage 3b: Secondary | ICD-10-CM | POA: Insufficient documentation

## 2023-09-05 DIAGNOSIS — N179 Acute kidney failure, unspecified: Secondary | ICD-10-CM | POA: Diagnosis present

## 2023-09-05 DIAGNOSIS — M7989 Other specified soft tissue disorders: Secondary | ICD-10-CM | POA: Diagnosis present

## 2023-09-05 DIAGNOSIS — Z8249 Family history of ischemic heart disease and other diseases of the circulatory system: Secondary | ICD-10-CM

## 2023-09-05 DIAGNOSIS — E1122 Type 2 diabetes mellitus with diabetic chronic kidney disease: Secondary | ICD-10-CM | POA: Insufficient documentation

## 2023-09-05 DIAGNOSIS — D509 Iron deficiency anemia, unspecified: Secondary | ICD-10-CM | POA: Diagnosis present

## 2023-09-05 DIAGNOSIS — Z6831 Body mass index (BMI) 31.0-31.9, adult: Secondary | ICD-10-CM

## 2023-09-05 DIAGNOSIS — I5082 Biventricular heart failure: Secondary | ICD-10-CM | POA: Insufficient documentation

## 2023-09-05 DIAGNOSIS — N183 Chronic kidney disease, stage 3 unspecified: Secondary | ICD-10-CM

## 2023-09-05 DIAGNOSIS — Z7984 Long term (current) use of oral hypoglycemic drugs: Secondary | ICD-10-CM

## 2023-09-05 DIAGNOSIS — I2510 Atherosclerotic heart disease of native coronary artery without angina pectoris: Secondary | ICD-10-CM | POA: Insufficient documentation

## 2023-09-05 DIAGNOSIS — R000 Tachycardia, unspecified: Secondary | ICD-10-CM | POA: Insufficient documentation

## 2023-09-05 DIAGNOSIS — G4733 Obstructive sleep apnea (adult) (pediatric): Secondary | ICD-10-CM | POA: Insufficient documentation

## 2023-09-05 DIAGNOSIS — Z89429 Acquired absence of other toe(s), unspecified side: Secondary | ICD-10-CM

## 2023-09-05 DIAGNOSIS — I081 Rheumatic disorders of both mitral and tricuspid valves: Secondary | ICD-10-CM | POA: Insufficient documentation

## 2023-09-05 DIAGNOSIS — I5084 End stage heart failure: Secondary | ICD-10-CM | POA: Diagnosis present

## 2023-09-05 DIAGNOSIS — R600 Localized edema: Secondary | ICD-10-CM | POA: Insufficient documentation

## 2023-09-05 DIAGNOSIS — Z833 Family history of diabetes mellitus: Secondary | ICD-10-CM

## 2023-09-05 DIAGNOSIS — I472 Ventricular tachycardia, unspecified: Secondary | ICD-10-CM | POA: Diagnosis present

## 2023-09-05 DIAGNOSIS — I5022 Chronic systolic (congestive) heart failure: Secondary | ICD-10-CM | POA: Insufficient documentation

## 2023-09-05 DIAGNOSIS — Z79899 Other long term (current) drug therapy: Secondary | ICD-10-CM | POA: Insufficient documentation

## 2023-09-05 DIAGNOSIS — Z811 Family history of alcohol abuse and dependence: Secondary | ICD-10-CM

## 2023-09-05 DIAGNOSIS — I252 Old myocardial infarction: Secondary | ICD-10-CM | POA: Insufficient documentation

## 2023-09-05 DIAGNOSIS — Z794 Long term (current) use of insulin: Secondary | ICD-10-CM | POA: Insufficient documentation

## 2023-09-05 DIAGNOSIS — E876 Hypokalemia: Secondary | ICD-10-CM | POA: Diagnosis present

## 2023-09-05 DIAGNOSIS — I130 Hypertensive heart and chronic kidney disease with heart failure and stage 1 through stage 4 chronic kidney disease, or unspecified chronic kidney disease: Secondary | ICD-10-CM | POA: Insufficient documentation

## 2023-09-05 DIAGNOSIS — Z823 Family history of stroke: Secondary | ICD-10-CM

## 2023-09-05 DIAGNOSIS — R0602 Shortness of breath: Secondary | ICD-10-CM | POA: Insufficient documentation

## 2023-09-05 DIAGNOSIS — E785 Hyperlipidemia, unspecified: Secondary | ICD-10-CM | POA: Insufficient documentation

## 2023-09-05 DIAGNOSIS — I428 Other cardiomyopathies: Secondary | ICD-10-CM | POA: Diagnosis present

## 2023-09-05 DIAGNOSIS — E1159 Type 2 diabetes mellitus with other circulatory complications: Secondary | ICD-10-CM

## 2023-09-05 DIAGNOSIS — R570 Cardiogenic shock: Secondary | ICD-10-CM | POA: Diagnosis present

## 2023-09-05 DIAGNOSIS — I340 Nonrheumatic mitral (valve) insufficiency: Secondary | ICD-10-CM

## 2023-09-05 DIAGNOSIS — Z01810 Encounter for preprocedural cardiovascular examination: Secondary | ICD-10-CM | POA: Diagnosis not present

## 2023-09-05 DIAGNOSIS — I509 Heart failure, unspecified: Principal | ICD-10-CM

## 2023-09-05 DIAGNOSIS — E7800 Pure hypercholesterolemia, unspecified: Secondary | ICD-10-CM | POA: Diagnosis present

## 2023-09-05 LAB — CBC
HCT: 41.7 % (ref 39.0–52.0)
HCT: 43.7 % (ref 39.0–52.0)
Hemoglobin: 12.3 g/dL — ABNORMAL LOW (ref 13.0–17.0)
Hemoglobin: 12.5 g/dL — ABNORMAL LOW (ref 13.0–17.0)
MCH: 18.7 pg — ABNORMAL LOW (ref 26.0–34.0)
MCH: 18.2 pg — ABNORMAL LOW (ref 26.0–34.0)
MCHC: 29.5 g/dL — ABNORMAL LOW (ref 30.0–36.0)
MCHC: 28.6 g/dL — ABNORMAL LOW (ref 30.0–36.0)
MCV: 63.4 fL — ABNORMAL LOW (ref 80.0–100.0)
MCV: 63.7 fL — ABNORMAL LOW (ref 80.0–100.0)
Platelets: 307 10*3/uL (ref 150–400)
Platelets: 303 10*3/uL (ref 150–400)
RBC: 6.58 MIL/uL — ABNORMAL HIGH (ref 4.22–5.81)
RBC: 6.86 MIL/uL — ABNORMAL HIGH (ref 4.22–5.81)
RDW: 23 % — ABNORMAL HIGH (ref 11.5–15.5)
RDW: 22.9 % — ABNORMAL HIGH (ref 11.5–15.5)
WBC: 7.9 10*3/uL (ref 4.0–10.5)
WBC: 6.6 10*3/uL (ref 4.0–10.5)
nRBC: 0 % (ref 0.0–0.2)
nRBC: 0 % (ref 0.0–0.2)

## 2023-09-05 LAB — COOXEMETRY PANEL
Carboxyhemoglobin: 0.7 % (ref 0.5–1.5)
Methemoglobin: 2.4 % — ABNORMAL HIGH (ref 0.0–1.5)
O2 Saturation: 33.2 %
Total hemoglobin: 12.3 g/dL (ref 12.0–16.0)

## 2023-09-05 LAB — BASIC METABOLIC PANEL
Anion gap: 9 (ref 5–15)
Anion gap: 10 (ref 5–15)
BUN: 106 mg/dL — ABNORMAL HIGH (ref 6–20)
BUN: 105 mg/dL — ABNORMAL HIGH (ref 6–20)
CO2: 24 mmol/L (ref 22–32)
CO2: 19 mmol/L — ABNORMAL LOW (ref 22–32)
Calcium: 8.8 mg/dL — ABNORMAL LOW (ref 8.9–10.3)
Calcium: 8.4 mg/dL — ABNORMAL LOW (ref 8.9–10.3)
Chloride: 101 mmol/L (ref 98–111)
Chloride: 104 mmol/L (ref 98–111)
Creatinine, Ser: 2.66 mg/dL — ABNORMAL HIGH (ref 0.61–1.24)
Creatinine, Ser: 2.6 mg/dL — ABNORMAL HIGH (ref 0.61–1.24)
GFR, Estimated: 31 mL/min — ABNORMAL LOW (ref >60)
GFR, Estimated: 32 mL/min — ABNORMAL LOW (ref >60)
Glucose, Bld: 171 mg/dL — ABNORMAL HIGH (ref 70–99)
Glucose, Bld: 163 mg/dL — ABNORMAL HIGH (ref 70–99)
Potassium: 3.6 mmol/L (ref 3.5–5.1)
Potassium: 3.1 mmol/L — ABNORMAL LOW (ref 3.5–5.1)
Sodium: 134 mmol/L — ABNORMAL LOW (ref 135–145)
Sodium: 133 mmol/L — ABNORMAL LOW (ref 135–145)

## 2023-09-05 LAB — CBC WITH DIFFERENTIAL/PLATELET
Abs Immature Granulocytes: 0.02 10*3/uL (ref 0.00–0.07)
Basophils Absolute: 0 10*3/uL (ref 0.0–0.1)
Basophils Relative: 1 %
Eosinophils Absolute: 0 10*3/uL (ref 0.0–0.5)
Eosinophils Relative: 0 %
HCT: 40.6 % (ref 39.0–52.0)
Hemoglobin: 11.7 g/dL — ABNORMAL LOW (ref 13.0–17.0)
Immature Granulocytes: 0 %
Lymphocytes Relative: 24 %
Lymphs Abs: 1.6 10*3/uL (ref 0.7–4.0)
MCH: 18.2 pg — ABNORMAL LOW (ref 26.0–34.0)
MCHC: 28.8 g/dL — ABNORMAL LOW (ref 30.0–36.0)
MCV: 63 fL — ABNORMAL LOW (ref 80.0–100.0)
Monocytes Absolute: 0.6 10*3/uL (ref 0.1–1.0)
Monocytes Relative: 9 %
Neutro Abs: 4.4 10*3/uL (ref 1.7–7.7)
Neutrophils Relative %: 66 %
Platelets: 286 10*3/uL (ref 150–400)
RBC: 6.44 MIL/uL — ABNORMAL HIGH (ref 4.22–5.81)
RDW: 22.9 % — ABNORMAL HIGH (ref 11.5–15.5)
WBC: 6.7 10*3/uL (ref 4.0–10.5)
nRBC: 0 % (ref 0.0–0.2)

## 2023-09-05 LAB — MRSA NEXT GEN BY PCR, NASAL: MRSA by PCR Next Gen: NOT DETECTED

## 2023-09-05 LAB — FERRITIN: Ferritin: 21 ng/mL — ABNORMAL LOW (ref 24–336)

## 2023-09-05 LAB — BRAIN NATRIURETIC PEPTIDE: B Natriuretic Peptide: 3881.1 pg/mL — ABNORMAL HIGH (ref 0.0–100.0)

## 2023-09-05 LAB — CREATININE, SERUM
Creatinine, Ser: 2.53 mg/dL — ABNORMAL HIGH (ref 0.61–1.24)
GFR, Estimated: 33 mL/min — ABNORMAL LOW (ref >60)

## 2023-09-05 LAB — CBG MONITORING, ED: Glucose-Capillary: 164 mg/dL — ABNORMAL HIGH (ref 70–99)

## 2023-09-05 LAB — IRON AND TIBC
Iron: 27 ug/dL — ABNORMAL LOW (ref 45–182)
Saturation Ratios: 6 % — ABNORMAL LOW (ref 17.9–39.5)
TIBC: 480 ug/dL — ABNORMAL HIGH (ref 250–450)
UIBC: 453 ug/dL

## 2023-09-05 LAB — HIV ANTIBODY (ROUTINE TESTING W REFLEX): HIV Screen 4th Generation wRfx: NONREACTIVE

## 2023-09-05 LAB — GLUCOSE, CAPILLARY
Glucose-Capillary: 205 mg/dL — ABNORMAL HIGH (ref 70–99)
Glucose-Capillary: 160 mg/dL — ABNORMAL HIGH (ref 70–99)

## 2023-09-05 LAB — LACTIC ACID, PLASMA: Lactic Acid, Venous: 1.5 mmol/L (ref 0.5–1.9)

## 2023-09-05 MED ORDER — CHLORHEXIDINE GLUCONATE CLOTH 2 % EX PADS
6.0000 | MEDICATED_PAD | Freq: Every day | CUTANEOUS | Status: DC
  Administered 2023-09-05 – 2023-09-12 (×7): 6 via TOPICAL

## 2023-09-05 MED ORDER — SODIUM CHLORIDE 0.9% FLUSH
3.0000 mL | Freq: Two times a day (BID) | INTRAVENOUS | Status: DC
  Administered 2023-09-05 – 2023-09-11 (×10): 3 mL via INTRAVENOUS

## 2023-09-05 MED ORDER — FUROSEMIDE 10 MG/ML IJ SOLN
80.0000 mg | Freq: Two times a day (BID) | INTRAMUSCULAR | Status: DC
  Administered 2023-09-05 – 2023-09-07 (×5): 80 mg via INTRAVENOUS
  Filled 2023-09-05 (×5): qty 8

## 2023-09-05 MED ORDER — POTASSIUM CHLORIDE CRYS ER 20 MEQ PO TBCR
40.0000 meq | EXTENDED_RELEASE_TABLET | Freq: Once | ORAL | Status: AC
  Administered 2023-09-05: 40 meq via ORAL
  Filled 2023-09-05: qty 2

## 2023-09-05 MED ORDER — FUROSEMIDE 10 MG/ML IJ SOLN: 80.0000 mg | Freq: Two times a day (BID) | INTRAMUSCULAR | Status: DC

## 2023-09-05 MED ORDER — ACETAMINOPHEN 325 MG PO TABS: 650.0000 mg | ORAL_TABLET | ORAL | Status: DC | PRN

## 2023-09-05 MED ORDER — ENOXAPARIN SODIUM 40 MG/0.4ML IJ SOSY
40.0000 mg | PREFILLED_SYRINGE | INTRAMUSCULAR | Status: DC
  Administered 2023-09-05 – 2023-09-08 (×4): 40 mg via SUBCUTANEOUS
  Filled 2023-09-05 (×4): qty 0.4

## 2023-09-05 MED ORDER — BUSPIRONE HCL 10 MG PO TABS
15.0000 mg | ORAL_TABLET | Freq: Two times a day (BID) | ORAL | Status: DC
  Administered 2023-09-05 – 2023-09-12 (×15): 15 mg via ORAL
  Filled 2023-09-05 (×15): qty 2

## 2023-09-05 MED ORDER — INSULIN ASPART 100 UNIT/ML IJ SOLN
0.0000 [IU] | Freq: Three times a day (TID) | INTRAMUSCULAR | Status: DC
  Administered 2023-09-05: 3 [IU] via SUBCUTANEOUS
  Administered 2023-09-05 – 2023-09-07 (×4): 5 [IU] via SUBCUTANEOUS

## 2023-09-05 MED ORDER — SODIUM CHLORIDE 0.9% FLUSH: 10.0000 mL | INTRAVENOUS | Status: DC | PRN

## 2023-09-05 MED ORDER — SODIUM CHLORIDE 0.9% FLUSH: 3.0000 mL | INTRAVENOUS | Status: DC | PRN

## 2023-09-05 MED ORDER — SODIUM CHLORIDE 0.9 % IV SOLN: 250.0000 mL | INTRAVENOUS | Status: AC | PRN

## 2023-09-05 MED ORDER — ACETAMINOPHEN 325 MG PO TABS: 650.0000 mg | ORAL_TABLET | Freq: Four times a day (QID) | ORAL | Status: DC | PRN

## 2023-09-05 MED ORDER — FUROSEMIDE 10 MG/ML IJ SOLN
80.0000 mg | Freq: Two times a day (BID) | INTRAMUSCULAR | Status: DC
  Administered 2023-09-05: 80 mg via INTRAVENOUS
  Filled 2023-09-05: qty 8

## 2023-09-05 MED ORDER — SODIUM CHLORIDE 0.9% FLUSH
10.0000 mL | Freq: Two times a day (BID) | INTRAVENOUS | Status: DC
  Administered 2023-09-05 – 2023-09-11 (×10): 10 mL

## 2023-09-05 MED ORDER — ONDANSETRON HCL 4 MG/2ML IJ SOLN: 4.0000 mg | Freq: Four times a day (QID) | INTRAMUSCULAR | Status: DC | PRN

## 2023-09-05 MED ORDER — ATORVASTATIN CALCIUM 40 MG PO TABS
40.0000 mg | ORAL_TABLET | Freq: Every day | ORAL | Status: DC
  Administered 2023-09-05 – 2023-09-12 (×8): 40 mg via ORAL
  Filled 2023-09-05 (×8): qty 1

## 2023-09-05 NOTE — Progress Notes (Signed)
Heart Failure Navigator Progress Note  Assessed for Heart & Vascular TOC clinic readiness.  Patient does not meet criteria due to Advanced Heart Failure Team patient of Dr. McLean.   Navigator will sign off at this time.   Mahreen Schewe, BSN, RN Heart Failure Nurse Navigator Secure Chat Only   

## 2023-09-05 NOTE — Patient Instructions (Signed)
Labs done in office today  Please report to ER for further treatment and admit to hospital

## 2023-09-05 NOTE — ED Provider Notes (Cosign Needed Addendum)
[place] EMERGENCY DEPARTMENT AT [facility] Provider Note   CSN: [id number] Arrival date & time: 09/05/23  1000     History  Chief Complaint  Patient presents with   Congestive Heart Failure    Jeffery Villa is a 37 y.o. male.  With history of hypertension, hypercholesterolemia, IDDM 2, CKD stage III, OSA, CHF with an EF of 20 to 25% on 07/01/2022 presenting to the ED for evaluation of bilateral leg swelling.  He has been seen by cardiology and had a change to his diuretic medications, but however he continues to have bilateral lower extremity swelling, up to the level of the mid thigh by the end of his workdays.  He reports some shortness of breath when going up a flight of stairs but denies shortness of breath at rest.  No chest pain.  Presented to his cardiology office appointment today and was found to be tachycardic and was told he needed to come to the emergency department for admission to the hospital.   Congestive Heart Failure Associated symptoms include shortness of breath.       Home Medications Prior to Admission medications   Medication Sig Start Date End Date Taking? Authorizing Provider  atorvastatin (LIPITOR) 40 MG tablet Take 1 tablet (40 mg total) by mouth daily. Patient taking differently: Take 40 mg by mouth at bedtime. 04/08/22  Yes Laurey Morale, MD  busPIRone (BUSPAR) 15 MG tablet Take 15 mg by mouth 2 (two) times daily.   Yes [provider]  amiodarone (PACERONE) 400 MG tablet Take 1 tablet (400 mg total) by mouth 2 (two) times daily. 09/12/23   Andrey Farmer, PA-C  enoxaparin (LOVENOX) 40 MG/0.4ML injection Inject 0.4 mLs (40 mg total) into the skin daily. 09/13/23   Andrey Farmer, PA-C  furosemide 200 mg in dextrose 5 % 80 mL Inject 25 mg/hr into the vein continuous. 09/12/23   Andrey Farmer, PA-C  insulin aspart (NOVOLOG) 100 UNIT/ML injection Inject 0-15 Units into the skin 3 (three) times daily with  meals. 09/12/23   Andrey Farmer, PA-C  insulin aspart (NOVOLOG) 100 UNIT/ML injection Inject 0-5 Units into the skin at bedtime. 09/12/23   Andrey Farmer, PA-C  insulin aspart (NOVOLOG) 100 UNIT/ML injection Inject 3 Units into the skin 3 (three) times daily with meals. 09/12/23   Andrey Farmer, PA-C  insulin glargine-yfgn [facility]) 100 UNIT/ML injection Inject 0.08 mLs (8 Units total) into the skin daily. 09/13/23   Andrey Farmer, PA-C  milrinone [facility]) 20 MG/100 ML SOLN infusion Inject 0.0554 mg/min into the vein continuous. 09/12/23   Andrey Farmer, PA-C  mupirocin cream (BACTROBAN) 2 % Apply topically daily. 09/13/23   Andrey Farmer, PA-C  norepinephrine (LEVOPHED) 4-5 MG/250ML-% SOLN Inject 2-10 mcg/min into the vein continuous. 09/12/23   Andrey Farmer, PA-C  ondansetron [facility]) 4 MG/2ML SOLN injection Inject 2 mLs (4 mg total) into the vein every 6 (six) hours as needed for nausea. 09/12/23   Andrey Farmer, PA-C      Allergies    Patient has no known allergies.    Review of Systems   Review of Systems  Respiratory:  Positive for shortness of breath.   All other systems reviewed and are negative.   Physical Exam Updated Vital Signs BP (!) 125/97   Pulse (!) 117   Temp (!) 97 F (36.1 C) (Oral)   Resp (!) 22   Ht 6\' 2"  (1.88 m)  Wt 110.2 kg   SpO2 99%   BMI 31.19 kg/m  Physical Exam Vitals and nursing note reviewed.  Constitutional:      General: He is not in acute distress.    Appearance: He is well-developed.     Comments: Resting comfortably in bed  HENT:     Head: Normocephalic and atraumatic.  Eyes:     Conjunctiva/sclera: Conjunctivae normal.  Cardiovascular:     Rate and Rhythm: Regular rhythm. Tachycardia present.     Heart sounds: No murmur heard. Pulmonary:     Effort: Pulmonary effort is normal. No respiratory distress.     Breath sounds: Normal breath sounds. No wheezing, rhonchi or  rales.  Abdominal:     Palpations: Abdomen is soft.     Tenderness: There is no abdominal tenderness.  Musculoskeletal:        General: No swelling.     Cervical back: Neck supple.     Comments: 2+ pitting edema to the level of the patellas bilaterally  Skin:    General: Skin is warm and dry.     Capillary Refill: Capillary refill takes less than 2 seconds.  Neurological:     Mental Status: He is alert.  Psychiatric:        Mood and Affect: Mood normal.     ED Results / Procedures / Treatments   Labs (all labs ordered are listed, but only abnormal results are displayed) Labs Reviewed  BASIC METABOLIC PANEL - Abnormal; Notable for the following components:      Result Value   Sodium 133 (*)    Potassium 3.1 (*)    CO2 19 (*)    Glucose, Bld 163 (*)    BUN 105 (*)    Creatinine, Ser 2.60 (*)    Calcium 8.4 (*)    GFR, Estimated 32 (*)    All other components within normal limits  CBC WITH DIFFERENTIAL/PLATELET - Abnormal; Notable for the following components:   RBC 6.44 (*)    Hemoglobin 11.7 (*)    MCV 63.0 (*)    MCH 18.2 (*)    MCHC 28.8 (*)    RDW 22.9 (*)    All other components within normal limits  BRAIN NATRIURETIC PEPTIDE - Abnormal; Notable for the following components:   B Natriuretic Peptide 3,881.1 (*)    All other components within normal limits  COOXEMETRY PANEL - Abnormal; Notable for the following components:   Methemoglobin 2.4 (*)    All other components within normal limits  FERRITIN - Abnormal; Notable for the following components:   Ferritin 21 (*)    All other components within normal limits  IRON AND TIBC - Abnormal; Notable for the following components:   Iron 27 (*)    TIBC 480 (*)    Saturation Ratios 6 (*)    All other components within normal limits  CBC - Abnormal; Notable for the following components:   RBC 6.86 (*)    Hemoglobin 12.5 (*)    MCV 63.7 (*)    MCH 18.2 (*)    MCHC 28.6 (*)    RDW 22.9 (*)    All other components  within normal limits  CREATININE, SERUM - Abnormal; Notable for the following components:   Creatinine, Ser 2.53 (*)    GFR, Estimated 33 (*)    All other components within normal limits  GLUCOSE, CAPILLARY - Abnormal; Notable for the following components:   Glucose-Capillary 205 (*)    All other components  within normal limits  BASIC METABOLIC PANEL - Abnormal; Notable for the following components:   Potassium 3.2 (*)    Glucose, Bld 105 (*)    BUN 102 (*)    Creatinine, Ser 2.31 (*)    Calcium 8.6 (*)    GFR, Estimated 37 (*)    All other components within normal limits  CBC - Abnormal; Notable for the following components:   RBC 6.38 (*)    Hemoglobin 11.7 (*)    MCV 63.3 (*)    MCH 18.3 (*)    MCHC 29.0 (*)    RDW 22.8 (*)    nRBC 0.3 (*)    All other components within normal limits  GLUCOSE, CAPILLARY - Abnormal; Notable for the following components:   Glucose-Capillary 160 (*)    All other components within normal limits  COOXEMETRY PANEL - Abnormal; Notable for the following components:   Carboxyhemoglobin <0.3 (*)    All other components within normal limits  GLUCOSE, CAPILLARY - Abnormal; Notable for the following components:   Glucose-Capillary 228 (*)    All other components within normal limits  LACTATE DEHYDROGENASE - Abnormal; Notable for the following components:   LDH 296 (*)    All other components within normal limits  URIC ACID - Abnormal; Notable for the following components:   Uric Acid, Serum 15.4 (*)    All other components within normal limits  T4, FREE - Abnormal; Notable for the following components:   Free T4 1.18 (*)    All other components within normal limits  LIPID PANEL - Abnormal; Notable for the following components:   HDL 22 (*)    All other components within normal limits  URINALYSIS, ROUTINE W REFLEX MICROSCOPIC - Abnormal; Notable for the following components:   Hgb urine dipstick SMALL (*)    Protein, ur 100 (*)    Leukocytes,Ua  SMALL (*)    All other components within normal limits  HEPATITIS B SURFACE ANTIBODY,QUALITATIVE - Abnormal; Notable for the following components:   Hep B S Ab Reactive (*)    All other components within normal limits  GLUCOSE, CAPILLARY - Abnormal; Notable for the following components:   Glucose-Capillary 209 (*)    All other components within normal limits  PROTIME-INR - Abnormal; Notable for the following components:   Prothrombin Time 15.9 (*)    INR 1.3 (*)    All other components within normal limits  BASIC METABOLIC PANEL - Abnormal; Notable for the following components:   Potassium 3.4 (*)    Glucose, Bld 259 (*)    BUN 90 (*)    Creatinine, Ser 2.47 (*)    Calcium 8.1 (*)    GFR, Estimated 34 (*)    All other components within normal limits  COOXEMETRY PANEL - Abnormal; Notable for the following components:   Total hemoglobin 11.3 (*)    Carboxyhemoglobin 1.7 (*)    All other components within normal limits  CBC - Abnormal; Notable for the following components:   RBC 5.89 (*)    Hemoglobin 10.7 (*)    HCT 37.3 (*)    MCV 63.3 (*)    MCH 18.2 (*)    MCHC 28.7 (*)    RDW 22.3 (*)    All other components within normal limits  GLUCOSE, CAPILLARY - Abnormal; Notable for the following components:   Glucose-Capillary 158 (*)    All other components within normal limits  GLUCOSE, CAPILLARY - Abnormal; Notable for the following components:  Glucose-Capillary 215 (*)    All other components within normal limits  GLUCOSE, CAPILLARY - Abnormal; Notable for the following components:   Glucose-Capillary 213 (*)    All other components within normal limits  GLUCOSE, CAPILLARY - Abnormal; Notable for the following components:   Glucose-Capillary 256 (*)    All other components within normal limits  BASIC METABOLIC PANEL - Abnormal; Notable for the following components:   Glucose, Bld 201 (*)    BUN 79 (*)    Creatinine, Ser 1.85 (*)    Calcium 7.9 (*)    GFR, Estimated 48  (*)    All other components within normal limits  COOXEMETRY PANEL - Abnormal; Notable for the following components:   Total hemoglobin 11.3 (*)    All other components within normal limits  CBC - Abnormal; Notable for the following components:   RBC 5.97 (*)    Hemoglobin 11.0 (*)    HCT 38.2 (*)    MCV 64.0 (*)    MCH 18.4 (*)    MCHC 28.8 (*)    RDW 22.2 (*)    All other components within normal limits  GLUCOSE, CAPILLARY - Abnormal; Notable for the following components:   Glucose-Capillary 349 (*)    All other components within normal limits  GLUCOSE, CAPILLARY - Abnormal; Notable for the following components:   Glucose-Capillary 180 (*)    All other components within normal limits  TSH - Abnormal; Notable for the following components:   TSH 5.647 (*)    All other components within normal limits  HEPATIC FUNCTION PANEL - Abnormal; Notable for the following components:   Total Protein 6.3 (*)    Albumin 2.1 (*)    All other components within normal limits  GLUCOSE, CAPILLARY - Abnormal; Notable for the following components:   Glucose-Capillary 143 (*)    All other components within normal limits  COMPREHENSIVE METABOLIC PANEL - Abnormal; Notable for the following components:   Sodium 129 (*)    Glucose, Bld 436 (*)    BUN 73 (*)    Creatinine, Ser 2.11 (*)    Calcium 8.3 (*)    Total Protein 6.1 (*)    Albumin 2.0 (*)    GFR, Estimated 41 (*)    All other components within normal limits  BASIC METABOLIC PANEL - Abnormal; Notable for the following components:   Sodium 133 (*)    Potassium 3.4 (*)    Glucose, Bld 347 (*)    BUN 69 (*)    Creatinine, Ser 2.04 (*)    Calcium 8.6 (*)    GFR, Estimated 43 (*)    All other components within normal limits  COOXEMETRY PANEL - Abnormal; Notable for the following components:   Total hemoglobin 11.6 (*)    Carboxyhemoglobin 2.2 (*)    All other components within normal limits  CBC - Abnormal; Notable for the following  components:   RBC 6.11 (*)    Hemoglobin 11.3 (*)    HCT 38.9 (*)    MCV 63.7 (*)    MCH 18.5 (*)    MCHC 29.0 (*)    RDW 22.5 (*)    All other components within normal limits  GLUCOSE, CAPILLARY - Abnormal; Notable for the following components:   Glucose-Capillary 179 (*)    All other components within normal limits  COOXEMETRY PANEL - Abnormal; Notable for the following components:   Total hemoglobin 11.9 (*)    Carboxyhemoglobin 1.7 (*)    All  other components within normal limits  GLUCOSE, CAPILLARY - Abnormal; Notable for the following components:   Glucose-Capillary 185 (*)    All other components within normal limits  GLUCOSE, CAPILLARY - Abnormal; Notable for the following components:   Glucose-Capillary 165 (*)    All other components within normal limits  GLUCOSE, CAPILLARY - Abnormal; Notable for the following components:   Glucose-Capillary 219 (*)    All other components within normal limits  GLUCOSE, CAPILLARY - Abnormal; Notable for the following components:   Glucose-Capillary 140 (*)    All other components within normal limits  COOXEMETRY PANEL - Abnormal; Notable for the following components:   Total hemoglobin 11.5 (*)    Carboxyhemoglobin 1.9 (*)    All other components within normal limits  BASIC METABOLIC PANEL - Abnormal; Notable for the following components:   Sodium 134 (*)    Glucose, Bld 297 (*)    BUN 70 (*)    Creatinine, Ser 2.23 (*)    Calcium 8.5 (*)    GFR, Estimated 38 (*)    All other components within normal limits  CBC - Abnormal; Notable for the following components:   RBC 5.89 (*)    Hemoglobin 10.7 (*)    HCT 37.4 (*)    MCV 63.5 (*)    MCH 18.2 (*)    MCHC 28.6 (*)    RDW 22.5 (*)    All other components within normal limits  GLUCOSE, CAPILLARY - Abnormal; Notable for the following components:   Glucose-Capillary 228 (*)    All other components within normal limits  GLUCOSE, CAPILLARY - Abnormal; Notable for the following  components:   Glucose-Capillary 211 (*)    All other components within normal limits  GLUCOSE, CAPILLARY - Abnormal; Notable for the following components:   Glucose-Capillary 183 (*)    All other components within normal limits  GLUCOSE, CAPILLARY - Abnormal; Notable for the following components:   Glucose-Capillary 178 (*)    All other components within normal limits  COOXEMETRY PANEL - Abnormal; Notable for the following components:   Total hemoglobin 11.1 (*)    Carboxyhemoglobin 2.1 (*)    All other components within normal limits  BASIC METABOLIC PANEL - Abnormal; Notable for the following components:   Glucose, Bld 223 (*)    BUN 72 (*)    Creatinine, Ser 2.52 (*)    Calcium 8.2 (*)    GFR, Estimated 33 (*)    All other components within normal limits  CBC - Abnormal; Notable for the following components:   RBC 5.87 (*)    Hemoglobin 10.8 (*)    HCT 37.1 (*)    MCV 63.2 (*)    MCH 18.4 (*)    MCHC 29.1 (*)    RDW 22.3 (*)    All other components within normal limits  GLUCOSE, CAPILLARY - Abnormal; Notable for the following components:   Glucose-Capillary 179 (*)    All other components within normal limits  HEPARIN LEVEL (UNFRACTIONATED) - Abnormal; Notable for the following components:   Heparin Unfractionated 0.11 (*)    All other components within normal limits  GLUCOSE, CAPILLARY - Abnormal; Notable for the following components:   Glucose-Capillary 226 (*)    All other components within normal limits  GLUCOSE, CAPILLARY - Abnormal; Notable for the following components:   Glucose-Capillary 121 (*)    All other components within normal limits  GLUCOSE, CAPILLARY - Abnormal; Notable for the following components:   Glucose-Capillary 155 (*)  All other components within normal limits  GLUCOSE, CAPILLARY - Abnormal; Notable for the following components:   Glucose-Capillary 200 (*)    All other components within normal limits  COOXEMETRY PANEL - Abnormal; Notable  for the following components:   Total hemoglobin 11.2 (*)    Carboxyhemoglobin 2.1 (*)    All other components within normal limits  CBC - Abnormal; Notable for the following components:   RBC 5.86 (*)    Hemoglobin 10.8 (*)    HCT 37.2 (*)    MCV 63.5 (*)    MCH 18.4 (*)    MCHC 29.0 (*)    RDW 22.5 (*)    All other components within normal limits  BASIC METABOLIC PANEL - Abnormal; Notable for the following components:   Potassium 3.4 (*)    Glucose, Bld 166 (*)    BUN 71 (*)    Creatinine, Ser 2.69 (*)    Calcium 8.6 (*)    GFR, Estimated 31 (*)    All other components within normal limits  GLUCOSE, CAPILLARY - Abnormal; Notable for the following components:   Glucose-Capillary 161 (*)    All other components within normal limits  COOXEMETRY PANEL - Abnormal; Notable for the following components:   Total hemoglobin 11.4 (*)    Carboxyhemoglobin 1.7 (*)    All other components within normal limits  GLUCOSE, CAPILLARY - Abnormal; Notable for the following components:   Glucose-Capillary 143 (*)    All other components within normal limits  GLUCOSE, CAPILLARY - Abnormal; Notable for the following components:   Glucose-Capillary 166 (*)    All other components within normal limits  CBG MONITORING, ED - Abnormal; Notable for the following components:   Glucose-Capillary 164 (*)    All other components within normal limits  POCT I-STAT 7, (LYTES, BLD GAS, ICA,H+H) - Abnormal; Notable for the following components:   pO2, Arterial 72 (*)    Potassium 3.2 (*)    Calcium, Ion 1.10 (*)    All other components within normal limits  POCT I-STAT EG7 - Abnormal; Notable for the following components:   pCO2, Ven 38.2 (*)    All other components within normal limits  POCT I-STAT EG7 - Abnormal; Notable for the following components:   pCO2, Ven 38.6 (*)    All other components within normal limits  MRSA NEXT GEN BY PCR, NASAL  MRSA NEXT GEN BY PCR, NASAL  HIV ANTIBODY (ROUTINE  TESTING W REFLEX)  COOXEMETRY PANEL  GLUCOSE, CAPILLARY  LUPUS ANTICOAGULANT PANEL  FACTOR 5 LEIDEN  PSA  RAPID URINE DRUG SCREEN, HOSP PERFORMED  HEPATITIS B SURFACE ANTIGEN  HEPATITIS B SURFACE ANTIBODY, QUANTITATIVE  HEPATITIS C ANTIBODY  PREALBUMIN  ANTITHROMBIN III  APTT  MAGNESIUM  COOXEMETRY PANEL  COOXEMETRY PANEL  LACTIC ACID, PLASMA  MAGNESIUM  COOXEMETRY PANEL  MAGNESIUM  MAGNESIUM  TYPE AND SCREEN  ABO/RH    EKG EKG Interpretation Date/Time:  Monday September 05 2023 11:21:56 EST Ventricular Rate:  104 PR Interval:  182 QRS Duration:  102 QT Interval:  374 QTC Calculation: 492 R Axis:   39  Text Interpretation: Sinus tachycardia Consider left atrial enlargement Nonspecific T abnormalities, lateral leads Borderline ST elevation, anterior leads Prolonged QT interval Confirmed by Vanetta Mulders [phone]) on 09/05/2023 11:44:44 AM  Radiology No results found.  Procedures .Critical Care  Performed by: Michelle Piper, PA-C Authorized by: Michelle Piper, PA-C   Critical care provider statement:    Critical care time (minutes):  41   Critical care was necessary to treat or prevent imminent or life-threatening deterioration of the following conditions:  Respiratory failure   Critical care was time spent personally by me on the following activities:  Development of treatment plan with patient or surrogate, discussions with consultants, evaluation of patient's response to treatment, examination of patient, ordering and review of laboratory studies, ordering and review of radiographic studies, ordering and performing treatments and interventions, pulse oximetry, re-evaluation of patient's condition and review of old charts   Care discussed with: admitting provider       Medications Ordered in ED Medications  0.9 %  sodium chloride infusion (has no administration in time range)  perflutren lipid microspheres (DEFINITY) IV suspension (4 mLs Intravenous  Given 09/06/23 1228)  0.9 %  sodium chloride infusion (250 mLs Intravenous Not Given 09/08/23 2000)  hydrALAZINE (APRESOLINE) injection 10 mg (has no administration in time range)  potassium chloride SA (KLOR-CON M) CR tablet 40 mEq (40 mEq Oral Given 09/05/23 1641)  potassium chloride SA (KLOR-CON M) CR tablet 40 mEq (40 mEq Oral Given 09/06/23 2100)  metolazone (ZAROXOLYN) tablet 2.5 mg (2.5 mg Oral Given 09/07/23 1730)  living well with diabetes book MISC ( Does not apply Given 09/07/23 1756)  potassium chloride SA (KLOR-CON M) CR tablet 40 mEq (40 mEq Oral Given 09/08/23 0016)  potassium chloride SA (KLOR-CON M) CR tablet 40 mEq (40 mEq Oral Given 09/08/23 1037)  furosemide (LASIX) 120 mg in dextrose 5 % 50 mL IVPB (0 mg Intravenous Stopping previously hung infusion 09/08/23 1558)  potassium chloride SA (KLOR-CON M) CR tablet 40 mEq (40 mEq Oral Given 09/09/23 2212)  potassium chloride SA (KLOR-CON M) CR tablet 40 mEq (40 mEq Oral Given 09/09/23 1644)  magnesium sulfate IVPB 2 g 50 mL (0 g Intravenous Stopped 09/10/23 1633)  potassium chloride SA (KLOR-CON M) CR tablet 40 mEq (40 mEq Oral Given 09/10/23 1640)  metolazone (ZAROXOLYN) tablet 2.5 mg (2.5 mg Oral Given 09/11/23 1423)  potassium chloride SA (KLOR-CON M) CR tablet 60 mEq (60 mEq Oral Given 09/11/23 1423)  potassium chloride SA (KLOR-CON M) CR tablet 60 mEq (60 mEq Oral Given 09/12/23 0941)  metolazone (ZAROXOLYN) tablet 2.5 mg (2.5 mg Oral Given 09/12/23 1227)  potassium chloride SA (KLOR-CON M) CR tablet 40 mEq (40 mEq Oral Given 09/12/23 1712)    ED Course/ Medical Decision Making/ A&P                                 Medical Decision Making Amount and/or Complexity of Data Reviewed Labs: ordered. Radiology: ordered.  Risk Decision regarding hospitalization.  This patient presents to the ED for concern of leg swelling, this involves an extensive number of treatment options, and is a complaint that carries with it a  high risk of complications and morbidity.  The differential diagnosis includes acute on chronic CHF, anasarca, dependent edema  My initial workup includes  Additional history obtained from: Nursing notes from this visit. Office visit [facility] with Tonye Becket, NP  I ordered, reviewed and interpreted labs which include: BMP, CBC, BNP  I ordered imaging studies including chest x-ray I independently visualized and interpreted imaging which showed pending at admission I agree with the radiologist interpretation  Cardiac Monitoring:  The patient was maintained on a cardiac monitor.  I personally viewed and interpreted the cardiac monitored which showed an underlying rhythm  of: Sinus tachycardia  Consultations Obtained:  I requested consultation with cardiology who was already aware of this patient,  and discussed lab and imaging findings as well as pertinent plan - they recommend: Admission  Afebrile, tachycardic but otherwise hemodynamically stable.  37 year old male presenting for evaluation of lower extremity swelling.  Has a history of CHF.  Has been compliant with his medications.  Presented to cardiology office today with concern for significantly decreased cardiac output given tachycardia and lower extremity swelling.  He was encouraged to come to the emergency department for admission to the hospital and cardiology workup.  On exam, he does have 2+ pitting edema to the level of the patellas bilaterally.  Denies any chest pain or shortness of breath at rest.  Lab workup was initiated, cardiology was already aware of patient and will admit.  Patient is in agreement with plan.  Stable at time of admission.  Note: Portions of this report may have been transcribed using voice recognition software. Every effort was made to ensure accuracy; however, inadvertent computerized transcription errors may still be present.         Final Clinical Impression(s) / ED  Diagnoses Final diagnoses:  Acute on chronic congestive heart failure, unspecified heart failure type (HCC)    Rx / DC Orders ED Discharge Orders          Ordered    insulin glargine-yfgn (SEMGLEE) 100 UNIT/ML injection  Daily        09/12/23 1616    enoxaparin (LOVENOX) 40 MG/0.4ML injection  Every 24 hours        09/12/23 1616    mupirocin cream (BACTROBAN) 2 %  Daily        09/12/23 1616    amiodarone (PACERONE) 400 MG tablet  2 times daily        09/12/23 1616    furosemide 200 mg in dextrose 5 % 80 mL  Continuous        09/12/23 1616    milrinone (PRIMACOR) 20 MG/100 ML SOLN infusion  Continuous        09/12/23 1616    norepinephrine (LEVOPHED) 4-5 MG/250ML-% SOLN  Continuous        09/12/23 1616    insulin aspart (NOVOLOG) 100 UNIT/ML injection  3 times daily with meals        09/12/23 1616    insulin aspart (NOVOLOG) 100 UNIT/ML injection  Daily at bedtime        09/12/23 1616    insulin aspart (NOVOLOG) 100 UNIT/ML injection  3 times daily with meals        09/12/23 1616    ondansetron (ZOFRAN) 4 MG/2ML SOLN injection  Every 6 hours PRN        09/12/23 1616    Diet - low sodium heart healthy        09/12/23 [address] [place]annie [place] 09/05/23 1109    Vanetta Mulders, MD 09/10/23 1648    Matina Rodier, Edsel Petrin, PA-C 10/11/23 1628    Vanetta Mulders, MD 10/12/23 1531

## 2023-09-05 NOTE — ED Triage Notes (Signed)
Pt seen at CHF clinic due to bilateral leg swelling, pt taking torsemide PO at home without relief. Pt seen in clinic on 10/24, given SQ lasix to try but only got some fluid off. Pt denies sob or chest pain. Labs drawn in clinic today

## 2023-09-05 NOTE — Progress Notes (Addendum)
ADVANCED HF CLINIC CLINIC NOTE   PCP: [facility] Cardiologist: Dr. Shirlee Latch  HPI: Jeffery Villa is a 37 y.o. male with past medical history of hypertension, hyperlipidemia, insulin-dependent diabetes mellitus since 37 years of age, CKD stage IIIb, OSA and chronic systolic heart failure.    He was seen 6/22 by PCP, referred to cardiology for echo. Echo 9/22 showed EF 20 to 25%, moderate to severe MR, moderate to severe TR, RVSP 60 mmHg.  He was started on GDMT. Zio placed 10/22 showing average heart rate 92, patient remained tachycardic 8.5% of total time, 3 episodes of wide-complex tachycardia with longest run 20 beats.   He saw a cardiologist at [facility], 10/22.  It was recommended he undergo outpatient Myoview, start LifeVest, then repeat echocardiogram in 3 months.  He did not follow back up. He wore the LifeVest for about a week before he discontinued it.    Admitted 5/23 to [facility] with left lower extremity pain. WBC 30.7, felt he was also slightly volume overloaded. Diuresed with IV lasix and placed on abx.  Echo showed EF 20-25%, global hypokinesis with moderate LV dilation, mod-severe MR (severe by PISA ERO), moderate TR.  He underwent RHC showing mildly elevated PCWP and normal RA pressure with mild pulmonary venous hypertension, CI 3.33 (off milrinone x 1 hour). No LHC with elevated SCr. Cardiac MRI showed >50% wall thickness subendocardial LGE in the mid anteroseptal wall, looked like a coronary disease pattern suggestive of prior MI. LVEF 15%. GDMT limited by AKI. Planned for ischemic eval in future when creatinine comes down. Discharged home, weight 264 lbs.  Echo in 8/23 showed EF 20% with severe LV dilation, moderate RV dilation with mildly decreased systolic function, mod-severe TR, mod-severe functional MR, PASP 64 mmHg, dilated IVC. TEE was done in 9/23 to assess MR, this showed EF 20-25%, LVESD 6.4 cm, PASP 69, moderately decreased RV function, moderate functional MR,  dilated IVC, severe TR.   No concerning variant on genetic testing 08/23.  Back in 2023 concerned he may be approaching need for heart/kidney transplant.  Cardiac PET ordered but not scheduled.  CPX 10/23: severe functional limitation d/t advanced HF and morbid obesity. VE/VCO2 slope severely elevated at 45.  Did not show up for appointment with Dr. Graciela Husbands in 11/23 to discuss ICD.  Last HF appointment 08/18/23 . Marked volume overload. Given Furoscix x 2 does and Metolazone x1 day the back to torsemide 80 mg/40 mg daily. BB was stopped due to possible low output. Also switched to hydralazine 25 mg three times a day + imdur 30 mg daily. He cancelled HF follow 08/25/23.   Today he returns for HF follow up. He has been working but easily gets tired. SOB after walking up 1 flight of stairs. Progressive lower extremity edema that extends to his upper thighs by the time he leaves work.  Wearing compression stockings.  Denies. PND/Orthopnea. Appetite ok. No fever or chills. Not using CPAP. Taking all medications.    He has 2 children  16 year old and 72 year old. His 21 year old son lives with him. He is separated but says his wife would help him if needed.   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   Denies ETOH, tobacco and drug use.    Review of Systems: All systems reviewed and negative except as mentioned in HPI.  PMH: 1. OSA 2. Type 2 diabetes: Since age 53.  3. HTN: First noted in high school. 4. CKD stage 3: Likely due to diabetes and HTN.  5. Hyperlipidemia 6. Chronic systolic CHF: Cardiomyopathy of uncertain etiology.  - Echo (9/22): EF 20-25%, mod-severe MR.  - Echo (5/23): EF 20-25%, moderate LV dilation, mod-severe MR, moderate TR - Cardiac MRI (5/23): Technically difficult, LVEF 15% with severe LV dilation, RVEF 15% with severe RV dilation, >50% subendocardial LGE mid anteroseptal wall (?prior MI).  - RHC (5/23): mean  RA 8, PA 58/27, mean PCWP 17, CI 3.33 - Echo (8/23): EF 20% with severe LV dilation, moderate RV dilation with mildly decreased systolic function, mod-severe TR, mod-severe functional MR, PASP 64 mmHg, dilated IVC.  - TEE (9/23): EF 20-25%, LVESD 6.4 cm, PASP 69, moderately decreased RV function, moderate functional MR, dilated IVC, severe TR.  - [employer] genetic testing: Sept9 gene mutation, unknown significance.  7. H/o diabetic foot with toe amputation.  8. Mitral regurgitation: Moderate functional MR on 9/23 TEE.  9. Tricuspid regurgitation: Severe TR on 9/23 TEE.    Current Outpatient Medications  Medication Sig Dispense Refill   acetaminophen (TYLENOL) 325 MG tablet Take 2 tablets (650 mg total) by mouth every 6 (six) hours as needed for mild pain (or Fever >/= 101).     atorvastatin (LIPITOR) 40 MG tablet Take 1 tablet (40 mg total) by mouth daily. 90 tablet 3   blood glucose meter kit and supplies Dispense based on patient and insurance preference. Use up to four times daily as directed. (FOR ICD-10 E10.9, E11.9). 1 each 0   busPIRone (BUSPAR) 15 MG tablet Take 15 mg by mouth 2 (two) times daily.     glipiZIDE (GLUCOTROL) 5 MG tablet Take 1 tablet (5 mg total) by mouth daily before breakfast. 30 tablet 0   glipiZIDE (GLUCOTROL) 5 MG tablet Take by mouth 2 (two) times daily before a meal.     hydrALAZINE (APRESOLINE) 50 MG tablet Take 50 mg by mouth in the morning and at bedtime.     Insulin Pen Needle 30G X 5 MM MISC New needle with every insulin adminsration 100 each 5   isosorbide mononitrate (IMDUR) 30 MG 24 hr tablet Take 1 tablet (30 mg total) by mouth daily. 30 tablet 5   metolazone (ZAROXOLYN) 2.5 MG tablet Patient takes 1 tablet by mouth once a week on Saturdays.     potassium chloride SA (KLOR-CON M) 20 MEQ tablet Take 80 mEq (4 tablets) on 10/25 then take 40 mEq (2 tablets) daily 64 tablet 5   torsemide (DEMADEX) 20 MG tablet TAKE 4 TABLETS (80 MG TOTAL) BY MOUTH IN THE MORNING  AND 2 TABLETS (40 MG TOTAL) EVERY EVENING. 540 tablet 2   No current facility-administered medications for this encounter.   No Known Allergies  Social History   Socioeconomic History   Marital status: Single    Spouse name: Not on file   Number of children: Not on file   Years of education: Not on file   Highest education level: Not on file  Occupational History   Not on file  Tobacco Use   Smoking status: Never   Smokeless tobacco: Not on file  Vaping Use   Vaping status: Never Used  Substance and Sexual Activity   Alcohol use: No    Alcohol/week: 0.0 standard drinks of alcohol   Drug use: No   Sexual activity: Not on file  Other Topics Concern   Not on file  Social History Narrative   Not on  file   Social Determinants of Health   Financial Resource Strain: Low Risk  (07/14/2023)   Received from [facility]   Overall Financial Resource Strain (CARDIA)    Difficulty of Paying Living Expenses: Not hard at all  Food Insecurity: No Food Insecurity (07/14/2023)   Received from [facility]   Hunger Vital Sign    Worried About Running Out of Food in the Last Year: Never true    Ran Out of Food in the Last Year: Never true  Transportation Needs: No Transportation Needs (07/14/2023)   Received from [facility] - Transportation    Lack of Transportation (Medical): No    Lack of Transportation (Non-Medical): No  Physical Activity: Sufficiently Active (10/21/2020)   [facility], [facility]   Exercise Vital Sign    Days of Exercise per Week: 4 days    Minutes of Exercise per Session: 60 min  Stress: Stress Concern Present (10/21/2020)   Received from [facility], [facility] Occupational [facility] - Occupational Stress Questionnaire    Feeling of Stress : To some extent  Social Connections: Unknown (03/04/2022)   Received from [facility], [facility]   Social Network    Social Network: Not on file  Intimate  Partner Violence: Unknown (01/28/2022)   Received from [facility], [facility]   HITS    Physically Hurt: Not on file    Insult or Talk Down To: Not on file    Threaten Physical Harm: Not on file    Scream or Curse: Not on file   Family History  Problem Relation Age of Onset   Diabetes Mother    Hypertension Mother    Stroke Mother    Alcohol abuse Father    Cancer Maternal Grandmother    Diabetes Maternal Grandmother    Hypertension Maternal Grandmother    His mother also had heart failure in her 65s and passed away from the heart failure.    BP 118/78   Pulse (!) 106   Wt 112.3 kg (247 lb 9.6 oz)   SpO2 99%   BMI 31.79 kg/m    Wt Readings from Last 3 Encounters:  09/05/23 112.3 kg (247 lb 9.6 oz)  08/18/23 108.9 kg (240 lb)  07/07/22 111.3 kg (245 lb 6.4 oz)   PHYSICAL EXAM: General:  Walked slowly in the clinic. No resp difficulty HEENT: normal Neck: supple. JVP to jaw.  Carotids 2+ bilat; no bruits. No lymphadenopathy or thryomegaly appreciated. Cor: PMI nondisplaced. Tachy Regular rate & rhythm. No rubs. + S3, gallops . LLSB murmurs. Lungs: clear Abdomen: soft, nontender, nondistended. No hepatosplenomegaly. No bruits or masses. Good bowel sounds. Extremities: no cyanosis, clubbing, rash, R and LLE 2-3+ edema Neuro: alert & orientedx3, cranial nerves grossly intact. moves all 4 extremities w/o difficulty. Affect pleasant  EKG: ST 106 bpm   ASSESSMENT & PLAN: 1. 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  Echo in 9/22 with EF 20-25%, moderate-severe MR, moderate-severe TR.  Echo 05/23 EF 20-25%, global hypokinesis with moderate LV dilation, mod-severe MR (severe by PISA ERO), moderate TR.  He has had HTN since his teens, could be due to long-standing poorly controlled HTN. With DM/HTN/hyperlipidemia x years, CAD must be a consideration though no chest pain. Prior viral myocarditis is possible as well. Additionally, his  mother died of CHF in her early 68s.  [facility] gene testing showed heterozygosity for Sept9 gene mutation, this was  of uncertain clinical significance.  cMRI limited study, difficult images due to problems with breath-holding. >50% wall thickness subendocardial LGE in the mid anteroseptal wall, looks like a coronary disease pattern suggestive of prior MI though does not seem extensive enough to cause his degree of LV dysfunction. LVEF 15% on cMRI.  Echo in 8/23 showed EF 20% with severe LV dilation, moderate RV dilation with mildly decreased systolic function, mod-severe TR, mod-severe functional MR, PASP 64 mmHg, dilated IVC.  Narrow QRS, not CRT candidate. Missed appointment with EP to discuss ICD. CPX 09/23: severe functional limitation d/t advanced HF and obesity, peak VO2 15, VE/VCO2 slope 45. - NYHA III. Marked volume overload + tachycardia. Concerned this is low output heart failure. Will need to admit for further work up. We discussed possible RHC and need for additional support based on results.  - Will need to start IV diuresis.  - GDMT limited by CKD Stage IIIb and suspected low output.  - No MRA/ARNI  - Could continue hydralazine/imdur.  - Concerned he will need dual organ transplant.  - Check lactic acid, cmet, cbc now.  -2. 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  - He will need to establish with Nephrology.  - Looks like creatinine baseline is ~ 2.4-2.6 - check BMET - check renal US.  3. Type 2 diabetes: Early onset.  - Last A1c was 7.9. Check    -Will need to start SSI.  4. HTN: Early onset in teens.  - GDMT per above  5. Hyperlipidemia: Continue atorvastatin.  6. Diabetic foot s/p toe amputation.  7. Mitral regurgitation: TEE in 9/23 showed moderate functional MR.    8. OSA: Needs CPAP, has not been able to tolerate. He has a new mask but hasn't tried it yet.   Discussed with Sabharwal and Dr Gala Romney.  Sent to the ED for A/C  Biventricular Heart Failure.    Haifa Hatton NP-C  09/05/2023

## 2023-09-05 NOTE — H&P (Addendum)
ADVANCED HEART FAILURE H&P  This note reflects work completed today.   HPI: Mr. Hettich is a 37 y.o. male with past medical history of hypertension, hyperlipidemia, insulin-dependent diabetes mellitus since 37 years of age, CKD stage IIIb, OSA and chronic systolic heart failure.     He was seen 6/22 by PCP, referred to cardiology for echo. Echo 9/22 showed EF 20 to 25%, moderate to severe MR, moderate to severe TR, RVSP 60 mmHg.  He was started on GDMT. Zio placed 10/22 showing average heart rate 92, patient remained tachycardic 8.5% of total time, 3 episodes of wide-complex tachycardia with longest run 20 beats.    He saw a cardiologist at [facility], 10/22.  It was recommended he undergo outpatient Myoview, start LifeVest, then repeat echocardiogram in 3 months.  He did not follow back up. He wore the LifeVest for about a week before he discontinued it.     Admitted 5/23 to [facility] with left lower extremity pain. WBC 30.7, felt he was also slightly volume overloaded. Diuresed with IV lasix and placed on abx.  Echo showed EF 20-25%, global hypokinesis with moderate LV dilation, mod-severe MR (severe by PISA ERO), moderate TR.  He underwent RHC showing mildly elevated PCWP and normal RA pressure with mild pulmonary venous hypertension, CI 3.33 (off milrinone x 1 hour). No LHC with elevated SCr. Cardiac MRI showed >50% wall thickness subendocardial LGE in the mid anteroseptal wall, looked like a coronary disease pattern suggestive of prior MI. LVEF 15%. GDMT limited by AKI. Planned for ischemic eval in future when creatinine comes down. Discharged home, weight 264 lbs.   Echo in 8/23 showed EF 20% with severe LV dilation, moderate RV dilation with mildly decreased systolic function, mod-severe TR, mod-severe functional MR, PASP 64 mmHg, dilated IVC. TEE was done in 9/23 to assess MR, this showed EF 20-25%, LVESD 6.4 cm, PASP 69, moderately decreased RV function, moderate functional MR, dilated IVC,  severe TR.    No concerning variant on genetic testing 08/23.   Back in 2023 concerned he may be approaching need for heart/kidney transplant.  Cardiac PET ordered but not scheduled.   CPX 10/23: severe functional limitation d/t advanced HF and morbid obesity. VE/VCO2 slope severely elevated at 45.   Did not show up for appointment with Dr. Graciela Husbands in 11/23 to discuss ICD.   Last HF appointment 08/18/23 . Marked volume overload. Given Furoscix x 2 does and Metolazone x1 day the back to torsemide 80 mg/40 mg daily. BB was stopped due to possible low output. Also switched to hydralazine 25 mg three times a day + imdur 30 mg daily. He cancelled HF follow 08/25/23.    Today he returns for HF follow up. He has been working but easily gets tired. SOB after walking up 1 flight of stairs. Progressive lower extremity edema that extends to his upper thighs by the time he leaves work.  Wearing compression stockings.  Denies. PND/Orthopnea. Appetite ok. No fever or chills. Not using CPAP. Taking all medications.     He has 2 children  70 year old and 8 year old. His 59 year old son lives with him. He is separated but says his wife would help him if needed.    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    Denies ETOH, tobacco and drug use.      Review of Systems: All systems reviewed and negative except as mentioned in  HPI.   PMH: 1. OSA 2. Type 2 diabetes: Since age 65.  3. HTN: First noted in high school. 4. CKD stage 3: Likely due to diabetes and HTN.  5. Hyperlipidemia 6. Chronic systolic CHF: Cardiomyopathy of uncertain etiology.  - Echo (9/22): EF 20-25%, mod-severe MR.  - Echo (5/23): EF 20-25%, moderate LV dilation, mod-severe MR, moderate TR - Cardiac MRI (5/23): Technically difficult, LVEF 15% with severe LV dilation, RVEF 15% with severe RV dilation, >50% subendocardial LGE mid anteroseptal wall (?prior MI).  - RHC (5/23): mean  RA 8, PA 58/27, mean PCWP 17, CI 3.33 - Echo (8/23): EF 20% with severe LV dilation, moderate RV dilation with mildly decreased systolic function, mod-severe TR, mod-severe functional MR, PASP 64 mmHg, dilated IVC.  - TEE (9/23): EF 20-25%, LVESD 6.4 cm, PASP 69, moderately decreased RV function, moderate functional MR, dilated IVC, severe TR.  - [employer] genetic testing: Sept9 gene mutation, unknown significance.  7. H/o diabetic foot with toe amputation.  8. Mitral regurgitation: Moderate functional MR on 9/23 TEE.  9. Tricuspid regurgitation: Severe TR on 9/23 TEE.            Current Outpatient Medications  Medication Sig Dispense Refill   acetaminophen (TYLENOL) 325 MG tablet Take 2 tablets (650 mg total) by mouth every 6 (six) hours as needed for mild pain (or Fever >/= 101).       atorvastatin (LIPITOR) 40 MG tablet Take 1 tablet (40 mg total) by mouth daily. 90 tablet 3   blood glucose meter kit and supplies Dispense based on patient and insurance preference. Use up to four times daily as directed. (FOR ICD-10 E10.9, E11.9). 1 each 0   busPIRone (BUSPAR) 15 MG tablet Take 15 mg by mouth 2 (two) times daily.       glipiZIDE (GLUCOTROL) 5 MG tablet Take 1 tablet (5 mg total) by mouth daily before breakfast. 30 tablet 0   glipiZIDE (GLUCOTROL) 5 MG tablet Take by mouth 2 (two) times daily before a meal.       hydrALAZINE (APRESOLINE) 50 MG tablet Take 50 mg by mouth in the morning and at bedtime.       Insulin Pen Needle 30G X 5 MM MISC New needle with every insulin adminsration 100 each 5   isosorbide mononitrate (IMDUR) 30 MG 24 hr tablet Take 1 tablet (30 mg total) by mouth daily. 30 tablet 5   metolazone (ZAROXOLYN) 2.5 MG tablet Patient takes 1 tablet by mouth once a week on Saturdays.       potassium chloride SA (KLOR-CON M) 20 MEQ tablet Take 80 mEq (4 tablets) on 10/25 then take 40 mEq (2 tablets) daily 64 tablet 5   torsemide (DEMADEX) 20 MG tablet TAKE 4 TABLETS (80 MG TOTAL) BY  MOUTH IN THE MORNING AND 2 TABLETS (40 MG TOTAL) EVERY EVENING. 540 tablet 2      No current facility-administered medications for this encounter.      Allergies  No Known Allergies     Social History         Socioeconomic History   Marital status: Single      Spouse name: Not on file   Number of children: Not on file   Years of education: Not on file   Highest education level: Not on file  Occupational History   Not on file  Tobacco Use   Smoking status: Never   Smokeless tobacco: Not on file  Vaping Use   Vaping  status: Never Used  Substance and Sexual Activity   Alcohol use: No      Alcohol/week: 0.0 standard drinks of alcohol   Drug use: No   Sexual activity: Not on file  Other Topics Concern   Not on file  Social History Narrative   Not on file    Social Determinants of Health        Financial Resource Strain: Low Risk  (07/14/2023)    Received from [facility]    Overall Financial Resource Strain (CARDIA)     Difficulty of Paying Living Expenses: Not hard at all  Food Insecurity: No Food Insecurity (07/14/2023)    Received from [facility]    Hunger Vital Sign     Worried About Running Out of Food in the Last Year: Never true     Ran Out of Food in the Last Year: Never true  Transportation Needs: No Transportation Needs (07/14/2023)    Received from [facility] - Transportation     Lack of Transportation (Medical): No     Lack of Transportation (Non-Medical): No  Physical Activity: Sufficiently Active (10/21/2020)    Received from [facility], [facility]    Exercise Vital Sign     Days of Exercise per Week: 4 days     Minutes of Exercise per Session: 60 min  Stress: Stress Concern Present (10/21/2020)    Received from [facility], [facility] - Occupational Stress Questionnaire     Feeling of Stress : To some extent  Social Connections: Unknown (03/04/2022)    Received from [facility], [facility]    Social Network     Social Network: Not on file  Intimate Partner Violence: Unknown (01/28/2022)    Received from [facility], [facility]    HITS     Physically Hurt: Not on file     Insult or Talk Down To: Not on file     Threaten Physical Harm: Not on file     Scream or Curse: Not on file         Family History  Problem Relation Age of Onset   Diabetes Mother     Hypertension Mother     Stroke Mother     Alcohol abuse Father     Cancer Maternal Grandmother     Diabetes Maternal Grandmother     Hypertension Maternal Grandmother          His mother also had heart failure in her 70s and passed away from the heart failure.     BP 118/78   Pulse (!) 106   Wt 112.3 kg (247 lb 9.6 oz)   SpO2 99%   BMI 31.79 kg/m         Wt Readings from Last 3 Encounters:  09/05/23 112.3 kg (247 lb 9.6 oz)  08/18/23 108.9 kg (240 lb)  07/07/22 111.3 kg (245 lb 6.4 oz)    PHYSICAL EXAM: General:  Walked slowly in the clinic. No resp difficulty HEENT: normal Neck: supple. JVP to jaw.  Carotids 2+ bilat; no bruits. No lymphadenopathy or thryomegaly appreciated. Cor: PMI nondisplaced. Tachy Regular rate & rhythm. No rubs. + S3, gallops . LLSB murmurs. Lungs: clear Abdomen: soft, nontender, nondistended. No hepatosplenomegaly. No bruits or masses. Good bowel sounds. Extremities: no cyanosis, clubbing, rash, R and LLE 2-3+ edema Neuro: alert & orientedx3, cranial nerves grossly intact. moves all 4 extremities w/o difficulty. Affect  pleasant   EKG: ST 106 bpm    ASSESSMENT & PLAN: 1. 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  Echo in 9/22 with EF 20-25%, moderate-severe MR, moderate-severe TR.  Echo 05/23 EF 20-25%, global hypokinesis with moderate LV dilation, mod-severe MR (severe by PISA ERO), moderate TR.  He has had HTN since his teens, could be due to long-standing poorly controlled HTN. With DM/HTN/hyperlipidemia x  years, CAD must be a consideration though no chest pain. Prior viral myocarditis is possible as well. Additionally, his mother died of CHF in her early 38s.  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  cMRI limited study, difficult images due to problems with breath-holding. >50% wall thickness subendocardial LGE in the mid anteroseptal wall, looks like a coronary disease pattern suggestive of prior MI though does not seem extensive enough to cause his degree of LV dysfunction. LVEF 15% on cMRI.  Echo in 8/23 showed EF 20% with severe LV dilation, moderate RV dilation with mildly decreased systolic function, mod-severe TR, mod-severe functional MR, PASP 64 mmHg, dilated IVC.  Narrow QRS, not CRT candidate. Missed appointment with EP to discuss ICD. CPX 09/23: severe functional limitation d/t advanced HF and obesity, peak VO2 15, VE/VCO2 slope 45. - NYHA III. Marked volume overload + tachycardia. Concerned this is low output heart failure. Will need to admit for further work up. We discussed possible RHC and need for additional support based on results.  - Will need to start IV diuresis.  - GDMT limited by CKD Stage IIIb and suspected low output.  - No MRA/ARNI  - Could continue hydralazine/imdur.  - Concerned he will need dual organ transplant.  - Check lactic acid, cmet, cbc now.  -2. 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  - He will need to establish with Nephrology.  - Looks like creatinine baseline is ~ 2.4-2.6 - check BMET - check renal US.  3. Type 2 diabetes: Early onset.  - Last A1c was 7.9. Check    -Will need to start SSI.  4. HTN: Early onset in teens.  - GDMT per above  5. Hyperlipidemia: Continue atorvastatin.  6. Diabetic foot s/p toe amputation.  7. Mitral regurgitation: TEE in 9/23 showed moderate functional MR.    8. OSA: Needs CPAP, has not been able to tolerate. He  has a new mask but hasn't tried it yet.    Labs obtained in HF clinic. Results pending.  Admitted with A/C Biventricular HF. Start IV lasix.   Tonye Becket NP- C 10:57 AM  Patient seen and examined with the above-signed Advanced Practice Provider and/or Housestaff. I personally reviewed laboratory data, imaging studies and relevant notes. I independently examined the patient and formulated the important aspects of the plan. I have edited the note to reflect any of my changes or salient points. I have personally discussed the plan with the patient and/or family.  37 y/o male with DM, CKD IIIb-IV and severe systolic HF fel possible due to CAD.   Seen recently in clinic with NYHA IV symptoms and marked volume overload. Refused admission.   Returned to clinic today with progressive symptoms and sent to ER.   Now with marked volume overload and likely low output.   However has responded to first dose of IV lasix.   General:  Thin. No resp difficulty HEENT: normal Neck: JVP to ear . Carotids 2+ bilat; no  bruits. No lymphadenopathy or thryomegaly appreciated. FAO:ZHYQM regular + s3 Lungs: clear Abdomen: soft, nontender, nondistended. No hepatosplenomegaly. No bruits or masses. Good bowel sounds. Extremities: no cyanosis, clubbing, rash, 2-3+ edema Neuro: alert & orientedx3, cranial nerves grossly intact. moves all 4 extremities w/o difficulty. Affect pleasant  He is very tenuous with marked volume overload and likely low output.   Place PICC to follow CVP and co-ox; low threshold for inotropes. Ideally would have R/L cath if renal function permits.   Arvilla Meres, MD  6:23 PM

## 2023-09-05 NOTE — Progress Notes (Signed)
Peripherally Inserted Central Catheter Placement  The IV Nurse has discussed with the patient and/or persons authorized to consent for the patient, the purpose of this procedure and the potential benefits and risks involved with this procedure.  The benefits include less needle sticks, lab draws from the catheter, and the patient may be discharged home with the catheter. Risks include, but not limited to, infection, bleeding, blood clot (thrombus formation), and puncture of an artery; nerve damage and irregular heartbeat and possibility to perform a PICC exchange if needed/ordered by physician.  Alternatives to this procedure were also discussed.  Bard Power PICC patient education guide, fact sheet on infection prevention and patient information card has been provided to patient /or left at bedside.    PICC Placement Documentation  PICC Double Lumen 09/05/23 Left Brachial 45 cm 0 cm (Active)  Indication for Insertion or Continuance of Line Vasoactive infusions 09/05/23 1852  Exposed Catheter (cm) 0 cm 09/05/23 1852  Site Assessment Clean, Dry, Intact 09/05/23 1852  Lumen #1 Status Flushed;Saline locked;Blood return noted 09/05/23 1852  Lumen #2 Status Flushed;Saline locked;Blood return noted 09/05/23 1852  Dressing Type Transparent;Securing device 09/05/23 1852  Dressing Status Antimicrobial disc in place;Clean, Dry, Intact 09/05/23 1852  Line Care Connections checked and tightened 09/05/23 1852  Line Adjustment (NICU/IV Team Only) No 09/05/23 1852  Dressing Intervention New dressing;Adhesive placed at insertion site (IV team only) 09/05/23 1852  Dressing Change Due 09/12/23 09/05/23 1852       Reginia Forts Albarece 09/05/2023, 6:53 PM

## 2023-09-06 ENCOUNTER — Inpatient Hospital Stay (HOSPITAL_COMMUNITY): Payer: BC Managed Care – PPO

## 2023-09-06 DIAGNOSIS — I5023 Acute on chronic systolic (congestive) heart failure: Secondary | ICD-10-CM

## 2023-09-06 LAB — TYPE AND SCREEN
ABO/RH(D): O POS
Antibody Screen: NEGATIVE

## 2023-09-06 LAB — URINALYSIS, ROUTINE W REFLEX MICROSCOPIC
Bacteria, UA: NONE SEEN
Bilirubin Urine: NEGATIVE
Glucose, UA: NEGATIVE mg/dL
Ketones, ur: NEGATIVE mg/dL
Nitrite: NEGATIVE
Protein, ur: 100 mg/dL — AB
Specific Gravity, Urine: 1.009 (ref 1.005–1.030)
pH: 6 (ref 5.0–8.0)

## 2023-09-06 LAB — HEMOGLOBIN A1C
Hgb A1c MFr Bld: 10 % — ABNORMAL HIGH (ref 4.8–5.6)
Mean Plasma Glucose: 240 mg/dL

## 2023-09-06 LAB — ECHOCARDIOGRAM COMPLETE
AR max vel: 3.84 cm2
AV Peak grad: 2.9 mm[Hg]
Ao pk vel: 0.85 m/s
Area-P 1/2: 5.23 cm2
Est EF: <20
Height: 74 in
MV M vel: 3.42 m/s
MV Peak grad: 46.6 mm[Hg]
Radius: 0.4 cm
S' Lateral: 6.2 cm
Weight: 3908.31 [oz_av]

## 2023-09-06 LAB — LACTATE DEHYDROGENASE: LDH: 296 U/L — ABNORMAL HIGH (ref 98–192)

## 2023-09-06 LAB — RAPID URINE DRUG SCREEN, HOSP PERFORMED
Amphetamines: NOT DETECTED
Barbiturates: NOT DETECTED
Benzodiazepines: NOT DETECTED
Cocaine: NOT DETECTED
Opiates: NOT DETECTED
Tetrahydrocannabinol: NOT DETECTED

## 2023-09-06 LAB — PROTIME-INR
INR: 1.3 — ABNORMAL HIGH (ref 0.8–1.2)
Prothrombin Time: 15.9 s — ABNORMAL HIGH (ref 11.4–15.2)

## 2023-09-06 LAB — T4, FREE: Free T4: 1.18 ng/dL — ABNORMAL HIGH (ref 0.61–1.12)

## 2023-09-06 LAB — HEPATITIS B SURFACE ANTIBODY,QUALITATIVE: Hep B S Ab: REACTIVE — AB

## 2023-09-06 LAB — GLUCOSE, CAPILLARY
Glucose-Capillary: 96 mg/dL (ref 70–99)
Glucose-Capillary: 228 mg/dL — ABNORMAL HIGH (ref 70–99)
Glucose-Capillary: 209 mg/dL — ABNORMAL HIGH (ref 70–99)
Glucose-Capillary: 158 mg/dL — ABNORMAL HIGH (ref 70–99)

## 2023-09-06 LAB — CBC
HCT: 40.4 % (ref 39.0–52.0)
Hemoglobin: 11.7 g/dL — ABNORMAL LOW (ref 13.0–17.0)
MCH: 18.3 pg — ABNORMAL LOW (ref 26.0–34.0)
MCHC: 29 g/dL — ABNORMAL LOW (ref 30.0–36.0)
MCV: 63.3 fL — ABNORMAL LOW (ref 80.0–100.0)
Platelets: 279 10*3/uL (ref 150–400)
RBC: 6.38 MIL/uL — ABNORMAL HIGH (ref 4.22–5.81)
RDW: 22.8 % — ABNORMAL HIGH (ref 11.5–15.5)
WBC: 7.2 10*3/uL (ref 4.0–10.5)
nRBC: 0.3 % — ABNORMAL HIGH (ref 0.0–0.2)

## 2023-09-06 LAB — LIPID PANEL
Cholesterol: 117 mg/dL (ref 0–200)
HDL: 22 mg/dL — ABNORMAL LOW (ref >40)
LDL Cholesterol: 73 mg/dL (ref 0–99)
Total CHOL/HDL Ratio: 5.3 {ratio}
Triglycerides: 112 mg/dL (ref <150)
VLDL: 22 mg/dL (ref 0–40)

## 2023-09-06 LAB — BASIC METABOLIC PANEL
Anion gap: 10 (ref 5–15)
BUN: 102 mg/dL — ABNORMAL HIGH (ref 6–20)
CO2: 22 mmol/L (ref 22–32)
Calcium: 8.6 mg/dL — ABNORMAL LOW (ref 8.9–10.3)
Chloride: 103 mmol/L (ref 98–111)
Creatinine, Ser: 2.31 mg/dL — ABNORMAL HIGH (ref 0.61–1.24)
GFR, Estimated: 37 mL/min — ABNORMAL LOW (ref >60)
Glucose, Bld: 105 mg/dL — ABNORMAL HIGH (ref 70–99)
Potassium: 3.2 mmol/L — ABNORMAL LOW (ref 3.5–5.1)
Sodium: 135 mmol/L (ref 135–145)

## 2023-09-06 LAB — PSA: Prostatic Specific Antigen: 0.23 ng/mL (ref 0.00–4.00)

## 2023-09-06 LAB — APTT: aPTT: 31 s (ref 24–36)

## 2023-09-06 LAB — COOXEMETRY PANEL
Carboxyhemoglobin: 0.5 % (ref 0.5–1.5)
Carboxyhemoglobin: <0.3 % — ABNORMAL LOW (ref 0.5–1.5)
Methemoglobin: <0.7 % (ref 0.0–1.5)
Methemoglobin: 0.9 % (ref 0.0–1.5)
O2 Saturation: 31.9 %
O2 Saturation: 59.3 %
Total hemoglobin: 12.5 g/dL (ref 12.0–16.0)
Total hemoglobin: 12.7 g/dL (ref 12.0–16.0)

## 2023-09-06 LAB — HEPATITIS C ANTIBODY: HCV Ab: NONREACTIVE

## 2023-09-06 LAB — ANTITHROMBIN III: AntiThromb III Func: 95 % (ref 75–120)

## 2023-09-06 LAB — HEPATITIS B SURFACE ANTIGEN: Hepatitis B Surface Ag: NONREACTIVE

## 2023-09-06 LAB — URIC ACID: Uric Acid, Serum: 15.4 mg/dL — ABNORMAL HIGH (ref 3.7–8.6)

## 2023-09-06 LAB — PREALBUMIN: Prealbumin: 19 mg/dL (ref 18–38)

## 2023-09-06 LAB — ABO/RH: ABO/RH(D): O POS

## 2023-09-06 MED ORDER — MILRINONE LACTATE IN DEXTROSE 20-5 MG/100ML-% IV SOLN
0.5000 ug/kg/min | INTRAVENOUS | Status: DC
  Administered 2023-09-06 – 2023-09-08 (×5): 0.25 ug/kg/min via INTRAVENOUS
  Administered 2023-09-08 – 2023-09-10 (×6): 0.375 ug/kg/min via INTRAVENOUS
  Administered 2023-09-10 – 2023-09-12 (×9): 0.5 ug/kg/min via INTRAVENOUS
  Filled 2023-09-06 (×21): qty 100

## 2023-09-06 MED ORDER — PERFLUTREN LIPID MICROSPHERE
1.0000 mL | INTRAVENOUS | Status: AC | PRN
  Administered 2023-09-06: 4 mL via INTRAVENOUS

## 2023-09-06 MED ORDER — POTASSIUM CHLORIDE CRYS ER 20 MEQ PO TBCR
40.0000 meq | EXTENDED_RELEASE_TABLET | Freq: Four times a day (QID) | ORAL | Status: AC
  Administered 2023-09-06 (×2): 40 meq via ORAL
  Filled 2023-09-06 (×2): qty 2

## 2023-09-06 NOTE — Progress Notes (Addendum)
Advanced Heart Failure Rounding Note  PCP-Cardiologist: None   Subjective:    Coox 33.2 and 31.9 overnight. 1.5 L UOP yesterday. Lasix 80 IV BID   sCr 2.66>2.3 Plan for cath pending improvement in renal function  Feeling fine this morning. No SOB, no CP. Ambulating around room. Appetite good.   Objective:   Weight Range: 110.8 kg Body mass index is 31.36 kg/m.   Vital Signs:   Temp:  [97.2 F (36.2 C)-97.7 F (36.5 C)] 97.5 F (36.4 C) (11/12 0624) Pulse Rate:  [97-137] 100 (11/12 0624) Resp:  [16-19] 16 (11/12 0624) BP: (112-125)/(96-106) 122/98 (11/12 0624) SpO2:  [92 %-100 %] 99 % (11/12 0624) Weight:  [110.8 kg-112.3 kg] 110.8 kg (11/12 0624) Last BM Date : 09/04/23  Weight change: Filed Weights   09/05/23 1014 09/06/23 0624  Weight: 112.3 kg 110.8 kg    Intake/Output:   Intake/Output Summary (Last 24 hours) at 09/06/2023 0740 Last data filed at 09/06/2023 0000 Gross per 24 hour  Intake 240 ml  Output 1500 ml  Net -1260 ml    Physical Exam    CVP 10 General: Well appearing. No distress on RA HEENT: neck supple.   Cardiac: JVP 12cm. S1 and S2 present. No murmurs or rub. Resp: Lung sounds clear and equal B/L Abdomen: Soft, non-tender, non-distended. + BS. Extremities: Warm and dry. No rash, cyanosis, or edema. LUE PICC Neuro: Alert and oriented x3. Affect pleasant. Moves all extremities without difficulty.  Telemetry   ST 100s (Personally reviewed)  EKG    N/a  Labs    CBC Recent Labs    09/05/23 1124 09/05/23 1601 09/06/23 0425  WBC 6.7 6.6 7.2  NEUTROABS 4.4  --   --   HGB 11.7* 12.5* 11.7*  HCT 40.6 43.7 40.4  MCV 63.0* 63.7* 63.3*  PLT 286 303 279   Basic Metabolic Panel Recent Labs    03/47/42 1124 09/05/23 1601 09/06/23 0425  NA 133*  --  135  K 3.1*  --  3.2*  CL 104  --  103  CO2 19*  --  22  GLUCOSE 163*  --  105*  BUN 105*  --  102*  CREATININE 2.60* 2.53* 2.31*  CALCIUM 8.4*  --  8.6*   Liver Function  Tests No results for input(s): "AST", "ALT", "ALKPHOS", "BILITOT", "PROT", "ALBUMIN" in the last 72 hours. No results for input(s): "LIPASE", "AMYLASE" in the last 72 hours. Cardiac Enzymes No results for input(s): "CKTOTAL", "CKMB", "CKMBINDEX", "TROPONINI" in the last 72 hours.  BNP: BNP (last 3 results) Recent Labs    08/18/23 1622 09/05/23 1124  BNP 4,064.5* 3,881.1*    ProBNP (last 3 results) No results for input(s): "PROBNP" in the last 8760 hours.   D-Dimer No results for input(s): "DDIMER" in the last 72 hours. Hemoglobin A1C Recent Labs    09/05/23 0939  HGBA1C 10.0*   Fasting Lipid Panel No results for input(s): "CHOL", "HDL", "LDLCALC", "TRIG", "CHOLHDL", "LDLDIRECT" in the last 72 hours. Thyroid Function Tests No results for input(s): "TSH", "T4TOTAL", "T3FREE", "THYROIDAB" in the last 72 hours.  Invalid input(s): "FREET3"  Other results:   Imaging   [place] EKG SITE RITE  Result Date: 09/05/2023 If Site Rite image not attached, placement could not be confirmed due to current cardiac rhythm.  DG Chest Port 1 View  Result Date: 09/05/2023 CLINICAL DATA:  Shortness of breath, CHF EXAM: PORTABLE CHEST 1 VIEW COMPARISON:  None Available. FINDINGS: Marked cardiomegaly without acute  edema or CHF. No focal pneumonia, significant collapse or consolidation. No large effusion or pneumothorax. Trachea midline. No acute osseous finding. IMPRESSION: Cardiomegaly without acute process. Electronically Signed   By: Judie Petit.  Shick M.D.   On: 09/05/2023 13:46    Medications:     Scheduled Medications:  atorvastatin  40 mg Oral Daily   busPIRone  15 mg Oral BID   Chlorhexidine Gluconate Cloth  6 each Topical Daily   enoxaparin (LOVENOX) injection  40 mg Subcutaneous Q24H   furosemide  80 mg Intravenous BID   insulin aspart  0-15 Units Subcutaneous TID WC   sodium chloride flush  10-40 mL Intracatheter Q12H   sodium chloride flush  3 mL Intravenous Q12H    Infusions:   sodium chloride     milrinone      PRN Medications: sodium chloride, acetaminophen, ondansetron (ZOFRAN) IV, sodium chloride flush, sodium chloride flush  Patient Profile   Jeffery Villa is a 37 y.o. male with past medical history of hypertension, hyperlipidemia, insulin-dependent diabetes mellitus since 37 years of age, CKD stage IIIb, OSA and chronic systolic heart failure.     Assessment/Plan   1. 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  Echo in 9/22 with EF 20-25%, moderate-severe MR, moderate-severe TR.  Echo 05/23 EF 20-25%, global hypokinesis with moderate LV dilation, mod-severe MR (severe by PISA ERO), moderate TR.  He has had HTN since his teens, could be due to long-standing poorly controlled HTN. With DM/HTN/hyperlipidemia x years, CAD must be a consideration though no chest pain. Prior viral myocarditis is possible as well. Additionally, his mother died of CHF in her early 67s.  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  cMRI limited study, difficult images due to problems with breath-holding. >50% wall thickness subendocardial LGE in the mid anteroseptal wall, looks like a coronary disease pattern suggestive of prior MI though does not seem extensive enough to cause his degree of LV dysfunction. LVEF 15% on cMRI.  Echo in 8/23 showed EF 20% with severe LV dilation, moderate RV dilation with mildly decreased systolic function, mod-severe TR, mod-severe functional MR, PASP 64 mmHg, dilated IVC.  Narrow QRS, not CRT candidate. Missed appointment with EP to discuss ICD. CPX 09/23: severe functional limitation d/t advanced HF and obesity, peak VO2 15, VE/VCO2 slope 45. - NYHA III. Concerned this is low output heart failure. Tachycardic with narrow pulse pressure 120/100s. LA 1.5 - Start Milrinone 0.25 mcg/kg/min for low ouput. Coox 32% x2 overnight.  - Markedly volume overload.  Continue IV Lasix 80mg  BID.   - GDMT limited by CKD Stage IIIb and suspected low output.  - No MRA/ARNI  - Concerned he will need dual organ transplant.  - Plan for possible cath pending improvement in renal function. Echo result pending.  -2. AKI on CKD stage 3b: Suspect baseline diabetic nephropathy, HTN may play role as well.  - He will need to establish with Nephrology.  - Looks like creatinine baseline is ~ 2.4-2.6. - sCr 2.3 today. - follow BMET - check renal US  3. Type 2 diabetes: Early onset.  - A1c 10.0 - SSI.   4. HTN: Early onset in teens.  - GDMT per above   5. Hyperlipidemia: Continue atorvastatin.   6. Diabetic foot s/p toe amputation.   7. Mitral regurgitation: TEE in 9/23 showed moderate functional MR.   Echo pending.  8. OSA: Needs CPAP, has not been able to  tolerate. He has a new mask but hasn't tried it yet.   Length of Stay: 1  Jeffery Lee, Jeffery Villa  09/06/2023, 7:40 AM  Advanced Heart Failure Team Pager [phone] (M-F; 7a - 5p)  Please contact [facility] for night-coverage after hours (5p -7a ) and weekends on [email or web address]  Patient seen and examined with the above-signed Advanced Practice Provider and/or Housestaff. I personally reviewed laboratory data, imaging studies and relevant notes. I independently examined the patient and formulated the important aspects of the plan. I have edited the note to reflect any of my changes or salient points. I have personally discussed the plan with the patient and/or family.  Co-ox in low 30s overnight x 2. Diuresing well. SCr improving 2.6 -> 2.3  Denies CP or SOB   General:  Thin weak appearing. No resp difficulty HEENT: normal Neck: supple. JVP to jaw. Cor: Regular tachy + s3  Lungs: clear Abdomen: soft, nontender, nondistended. No hepatosplenomegaly. No bruits or masses. Good bowel sounds. Extremities: no cyanosis, clubbing, rash, 2+ edema Neuro: alert & orientedx3, cranial nerves grossly intact. moves all 4  extremities w/o difficulty. Affect pleasant  He has low output with marked volume overload. Start milrinone. Continue diuresis.   Ideally would work up for advanced therapies pending improvement in renal function vs dual organ transplant though need to ensure social situation is acceptable.   Long talk at bedside with him and his wife and he is interested and has good social support.   Arvilla Meres, MD  10:42 AM

## 2023-09-06 NOTE — Progress Notes (Signed)
Pt had some NSVT noted around 2010. Pt just got out from the bathroom, walking towards the sink. Pt said he is "OK" and no complaints. Vs stable.  Care ongoing. Labs has been ordered.

## 2023-09-06 NOTE — Progress Notes (Signed)
Attempted Echocardiogram, patient care is in progress. Come back at a later time.

## 2023-09-06 NOTE — Progress Notes (Signed)
Transported to radiology for ct scan of abdomen and pelvis by wheelchair awake and alert

## 2023-09-06 NOTE — Progress Notes (Signed)
Echocardiogram 2D Echocardiogram has been performed.  Jeffery Villa 09/06/2023, 12:29 PM

## 2023-09-06 NOTE — Inpatient Diabetes Management (Signed)
Inpatient Diabetes Program Recommendations  AACE/ADA: New Consensus Statement on Inpatient Glycemic Control (2015)  Target Ranges:  Prepandial:   less than 140 mg/dL      Peak postprandial:   less than 180 mg/dL (1-2 hours)      Critically ill patients:  140 - 180 mg/dL   Lab Results  Component Value Date   GLUCAP 96 09/06/2023   HGBA1C 10.0 (H) 09/05/2023    Review of Glycemic Control  Diabetes history: Type 2 Dm Outpatient Diabetes medications: Glipizide 5 mg BID Current orders for Inpatient glycemic control: Novolog 0-15 units TID  Inpatient Diabetes Program Recommendations:    Spoke with patient regarding outpatient diabetes management. Patient has been on insulin in the past however, has not been currently prescribed. Of note, was prescribed Marcelline Deist but never picked it up from the pharmacy. Reviewed patient's current A1c of 10%. Explained what a A1c is and what it measures. Also reviewed goal A1c with patient, importance of good glucose control @ home, and blood sugar goals. Reviewed patho of DM, need for lifestyle changes, impact from cardiac perspective, vascular changes, survival skills, interventions and other commorbidities.  Patient has a meter and testing supplies. Checks CBGs twice per day and reports that blood sugar usually runs in the 200's mg/dL. Discussed importance of reaching out to PCP. Patient is interested in CGM. Reviewed benefits and risks and how to download application to device.  Admits to drinking sugary beverages. Reviewed alternatives, importance of being mindful of CHO intake, importance of protein, basic CHO counting and nutritional labels. Patient has no further questions at this time.   Thanks, Lujean Rave, MSN, RNC-OB Diabetes Coordinator [phone] (8a-5p)

## 2023-09-06 NOTE — Progress Notes (Signed)
Met with pt today to discuss advanced therapies. We discussed VAD and transplant. Pt tells me that he will do whatever he needs to do to live. He states that he has 35 year old and 35 year old and would like to watch them grow up. 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 He tells me that he let the fluid gather on him for too long this time around. Evaluation for advanced therapies will require some imaging and blood work. These have been ordered. We will send his results to [facility] and get him based on his hospital course and the directions of Dr Gala Romney.  Carlton Adam RN, BSN VAD Coordinator 24/7 Pager [phone]

## 2023-09-07 ENCOUNTER — Inpatient Hospital Stay (HOSPITAL_COMMUNITY): Payer: BC Managed Care – PPO

## 2023-09-07 ENCOUNTER — Encounter (HOSPITAL_COMMUNITY): Payer: Self-pay | Admitting: Internal Medicine

## 2023-09-07 DIAGNOSIS — Z01810 Encounter for preprocedural cardiovascular examination: Secondary | ICD-10-CM

## 2023-09-07 DIAGNOSIS — I5023 Acute on chronic systolic (congestive) heart failure: Secondary | ICD-10-CM | POA: Diagnosis not present

## 2023-09-07 LAB — HEPATITIS B SURFACE ANTIBODY, QUANTITATIVE: Hep B S AB Quant (Post): 28.9 m[IU]/mL

## 2023-09-07 LAB — GLUCOSE, CAPILLARY
Glucose-Capillary: 215 mg/dL — ABNORMAL HIGH (ref 70–99)
Glucose-Capillary: 213 mg/dL — ABNORMAL HIGH (ref 70–99)
Glucose-Capillary: 256 mg/dL — ABNORMAL HIGH (ref 70–99)
Glucose-Capillary: 349 mg/dL — ABNORMAL HIGH (ref 70–99)

## 2023-09-07 LAB — CBC
HCT: 37.3 % — ABNORMAL LOW (ref 39.0–52.0)
Hemoglobin: 10.7 g/dL — ABNORMAL LOW (ref 13.0–17.0)
MCH: 18.2 pg — ABNORMAL LOW (ref 26.0–34.0)
MCHC: 28.7 g/dL — ABNORMAL LOW (ref 30.0–36.0)
MCV: 63.3 fL — ABNORMAL LOW (ref 80.0–100.0)
Platelets: 255 10*3/uL (ref 150–400)
RBC: 5.89 MIL/uL — ABNORMAL HIGH (ref 4.22–5.81)
RDW: 22.3 % — ABNORMAL HIGH (ref 11.5–15.5)
WBC: 6.8 10*3/uL (ref 4.0–10.5)
nRBC: 0 % (ref 0.0–0.2)

## 2023-09-07 LAB — BASIC METABOLIC PANEL
Anion gap: 5 (ref 5–15)
BUN: 90 mg/dL — ABNORMAL HIGH (ref 6–20)
CO2: 26 mmol/L (ref 22–32)
Calcium: 8.1 mg/dL — ABNORMAL LOW (ref 8.9–10.3)
Chloride: 104 mmol/L (ref 98–111)
Creatinine, Ser: 2.47 mg/dL — ABNORMAL HIGH (ref 0.61–1.24)
GFR, Estimated: 34 mL/min — ABNORMAL LOW (ref >60)
Glucose, Bld: 259 mg/dL — ABNORMAL HIGH (ref 70–99)
Potassium: 3.4 mmol/L — ABNORMAL LOW (ref 3.5–5.1)
Sodium: 135 mmol/L (ref 135–145)

## 2023-09-07 LAB — LUPUS ANTICOAGULANT PANEL
DRVVT: 43.9 s (ref 0.0–47.0)
PTT Lupus Anticoagulant: 40.2 s (ref 0.0–43.5)

## 2023-09-07 LAB — COOXEMETRY PANEL
Carboxyhemoglobin: 1.7 % — ABNORMAL HIGH (ref 0.5–1.5)
Methemoglobin: <0.7 % (ref 0.0–1.5)
O2 Saturation: 64.8 %
Total hemoglobin: 11.3 g/dL — ABNORMAL LOW (ref 12.0–16.0)

## 2023-09-07 LAB — MAGNESIUM: Magnesium: 2.1 mg/dL (ref 1.7–2.4)

## 2023-09-07 MED ORDER — INSULIN ASPART 100 UNIT/ML IJ SOLN
0.0000 [IU] | Freq: Every day | INTRAMUSCULAR | Status: DC
  Administered 2023-09-07: 4 [IU] via SUBCUTANEOUS
  Administered 2023-09-09: 2 [IU] via SUBCUTANEOUS

## 2023-09-07 MED ORDER — LIVING WELL WITH DIABETES BOOK
Freq: Once | Status: AC
  Filled 2023-09-07 (×2): qty 1

## 2023-09-07 MED ORDER — METOLAZONE 2.5 MG PO TABS
2.5000 mg | ORAL_TABLET | Freq: Once | ORAL | Status: AC
  Administered 2023-09-07: 2.5 mg via ORAL
  Filled 2023-09-07: qty 1

## 2023-09-07 MED ORDER — POTASSIUM CHLORIDE CRYS ER 20 MEQ PO TBCR: 40.0000 meq | EXTENDED_RELEASE_TABLET | ORAL | Status: DC

## 2023-09-07 MED ORDER — POTASSIUM CHLORIDE CRYS ER 20 MEQ PO TBCR
40.0000 meq | EXTENDED_RELEASE_TABLET | ORAL | Status: AC
  Administered 2023-09-07 – 2023-09-08 (×3): 40 meq via ORAL
  Filled 2023-09-07 (×3): qty 2

## 2023-09-07 MED ORDER — INSULIN ASPART 100 UNIT/ML IJ SOLN
0.0000 [IU] | Freq: Three times a day (TID) | INTRAMUSCULAR | Status: DC
  Administered 2023-09-08 (×2): 4 [IU] via SUBCUTANEOUS
  Administered 2023-09-08 – 2023-09-09 (×2): 3 [IU] via SUBCUTANEOUS
  Administered 2023-09-09: 4 [IU] via SUBCUTANEOUS
  Administered 2023-09-09: 7 [IU] via SUBCUTANEOUS
  Administered 2023-09-10 (×2): 4 [IU] via SUBCUTANEOUS
  Administered 2023-09-10 – 2023-09-11 (×2): 7 [IU] via SUBCUTANEOUS
  Administered 2023-09-11: 1 [IU] via SUBCUTANEOUS

## 2023-09-07 NOTE — TOC Initial Note (Signed)
Transition of Care [facility]) - Initial/Assessment Note    Patient Details  Name: Jeffery Villa MRN: [id number] Date of Birth: 14-Jul-1986  [facility]) CM/SW Contact:    Nicanor Bake Phone Number: [phone] 09/07/2023, 10:29 AM  Clinical Narrative:   HF CSW met with pt at bedside. Pt lives with son, 35 years old. Pt stated that he is separated from his wife but she is willing to support him with anything. Pt stated that he has no history of HH services. Pt stated that he has a CPAP machine at home but does not use it due to difficulties with machine. Pt stated that he has a scale at home. Pt stated that his PCP is Loss adjuster, chartered at [employer]. CSW explained that a hospital follow up appointment will be scheduled clsoer to dc. Pt agrees.   TOC will continue following.                        Patient Goals and CMS Choice            Expected Discharge Plan and Services                                              Prior Living Arrangements/Services                       Activities of Daily Living   ADL Screening (condition at time of admission) Independently performs ADLs?: Yes (appropriate for developmental age) Is the patient deaf or have difficulty hearing?: No Does the patient have difficulty seeing, even when wearing glasses/contacts?: No Does the patient have difficulty concentrating, remembering, or making decisions?: No  Permission Sought/Granted                  Emotional Assessment              Admission diagnosis:  Acute on chronic clinical systolic heart failure (HCC) [I50.23] Acute on chronic congestive heart failure, unspecified heart failure type [facility]) [I50.9] Patient Active Problem List   Diagnosis Date Noted   Acute on chronic clinical systolic heart failure (HCC) 09/05/2023   Sepsis due to cellulitis (HCC) 03/05/2022   Chronic systolic CHF (congestive heart failure) (HCC) 03/05/2022   Acute renal  failure superimposed on stage 3a chronic kidney disease (HCC) 03/05/2022   S/P amputation of lesser toe, right (HCC) 08/18/2021   Moderate mitral regurgitation 07/29/2021   Moderate tricuspid regurgitation 07/29/2021   Cardiac LV ejection fraction of 20-34% 07/24/2021   Stage 3a chronic kidney disease (HCC) 12/02/2020   BMI 33.0-33.9,adult 07/12/2019   Hyperlipidemia associated with type 2 diabetes mellitus (HCC) 11/20/2017   Hypertension associated with diabetes (HCC) 11/20/2017   Uncontrolled type 2 diabetes mellitus with hyperglycemia, with long-term current use of insulin (HCC) 09/18/2014   Diabetic peripheral neuropathy (HCC) 09/18/2014   Diabetic foot ulcer (HCC) 09/18/2014   PCP:  Default, Provider, MD Pharmacy:   [facility] [phone] Judithann Sheen, [place] - [address] [place] [address] Phone: [phone] Fax: [phone]     Social Determinants of Health (SDOH) Social History: SDOH Screenings   Food Insecurity: No Food Insecurity (09/05/2023)  Housing: Patient Unable To Answer (09/07/2023)  Transportation Needs: No Transportation Needs (09/05/2023)  Utilities: Not At Risk (09/05/2023)  Alcohol Screen: Low Risk  (  09/07/2023)  Financial Resource Strain: Low Risk  (07/14/2023)   Received from [facility]  Physical Activity: Sufficiently Active (10/21/2020)   Received from [facility]  Social Connections: Unknown (03/04/2022)   Received from [facility]  Stress: Stress Concern Present (10/21/2020)   Received from [facility]  Tobacco Use: Unknown (09/05/2023)   SDOH Interventions:     Readmission Risk Interventions     No data to display

## 2023-09-07 NOTE — Progress Notes (Addendum)
Advanced Heart Failure Rounding Note  PCP-Cardiologist: None   Subjective:    CO-OX 65% on milrinone 0.25 mcg/kg/min.  3L UOP charted yesterday with IV lasix 80 BID. ? weight down only 2 lb. CVP 10.  Multiple runs of NSVT on tele, longest 27 beats yesterday around 8 pm  No dyspnea. Appetite okay. Has been up walking in room.  Objective:   Weight Range: 111.5 kg Body mass index is 31.56 kg/m.   Vital Signs:   Temp:  [97.7 F (36.5 C)-98.3 F (36.8 C)] 98.3 F (36.8 C) (11/13 0738) Pulse Rate:  [113-115] 113 (11/13 0350) Resp:  [14-18] 14 (11/13 0738) BP: (120-151)/(85-116) 124/95 (11/13 0738) SpO2:  [96 %-99 %] 97 % (11/13 0738) Weight:  [111.5 kg] 111.5 kg (11/13 0410) Last BM Date : 09/04/23  Weight change: Filed Weights   09/05/23 1014 09/06/23 0624 09/07/23 0410  Weight: 112.3 kg 110.8 kg 111.5 kg    Intake/Output:   Intake/Output Summary (Last 24 hours) at 09/07/2023 0838 Last data filed at 09/07/2023 0700 Gross per 24 hour  Intake 774.03 ml  Output 3025 ml  Net -2250.97 ml    CVP 10 sitting up in chair Physical Exam    General:  Thin, fatigued appearing HEENT: normal Neck: supple. JVP 12-14. Carotids 2+ bilat; no bruits.  Cor: PMI nondisplaced. Regular rate & rhythm, tachy No rubs, gallops or murmurs. Lungs: clear Abdomen: soft, nontender, nondistended.  Extremities: no cyanosis, clubbing, rash, 2+ edema Neuro: alert & orientedx3. moves all 4 extremities w/o difficulty. Affect pleasant   Telemetry   ST 120s, runs of NSVT, longest 27 beats   EKG    N/a  Labs    CBC Recent Labs    09/05/23 1124 09/05/23 1601 09/06/23 0425 09/07/23 0515  WBC 6.7   < > 7.2 6.8  NEUTROABS 4.4  --   --   --   HGB 11.7*   < > 11.7* 10.7*  HCT 40.6   < > 40.4 37.3*  MCV 63.0*   < > 63.3* 63.3*  PLT 286   < > 279 255   < > = values in this interval not displayed.   Basic Metabolic Panel Recent Labs    78/29/56 0425 09/07/23 0515  NA 135 135   K 3.2* 3.4*  CL 103 104  CO2 22 26  GLUCOSE 105* 259*  BUN 102* 90*  CREATININE 2.31* 2.47*  CALCIUM 8.6* 8.1*   Liver Function Tests No results for input(s): "AST", "ALT", "ALKPHOS", "BILITOT", "PROT", "ALBUMIN" in the last 72 hours. No results for input(s): "LIPASE", "AMYLASE" in the last 72 hours. Cardiac Enzymes No results for input(s): "CKTOTAL", "CKMB", "CKMBINDEX", "TROPONINI" in the last 72 hours.  BNP: BNP (last 3 results) Recent Labs    08/18/23 1622 09/05/23 1124  BNP 4,064.5* 3,881.1*    ProBNP (last 3 results) No results for input(s): "PROBNP" in the last 8760 hours.   D-Dimer No results for input(s): "DDIMER" in the last 72 hours. Hemoglobin A1C Recent Labs    09/05/23 0939  HGBA1C 10.0*   Fasting Lipid Panel Recent Labs    09/06/23 1505  CHOL 117  HDL 22*  LDLCALC 73  TRIG 213  CHOLHDL 5.3   Thyroid Function Tests No results for input(s): "TSH", "T4TOTAL", "T3FREE", "THYROIDAB" in the last 72 hours.  Invalid input(s): "FREET3"  Other results:   Imaging   CT CHEST ABDOMEN PELVIS WO CONTRAST  Result Date: 09/06/2023 CLINICAL DATA:  Metastatic disease  evaluation EXAM: CT CHEST, ABDOMEN AND PELVIS WITHOUT CONTRAST TECHNIQUE: Multidetector CT imaging of the chest, abdomen and pelvis was performed following the standard protocol without IV contrast. RADIATION DOSE REDUCTION: This exam was performed according to the departmental dose-optimization program which includes automated exposure control, adjustment of the mA and/or kV according to patient size and/or use of iterative reconstruction technique. COMPARISON:  CT abdomen and pelvis 03/05/2022 FINDINGS: CT CHEST FINDINGS Cardiovascular: Heart is moderately enlarged. There is no pericardial effusion. Aorta is normal in size. There are atherosclerotic calcifications of the coronary arteries. Left-sided central venous catheter tip ends in the SVC. Mediastinum/Nodes: There is an enlarged precarinal  lymph node measuring 14 mm short axis. No other enlarged lymph nodes are seen. Visualized thyroid gland and esophagus are within normal limits. Lungs/Pleura: There some faint patchy and confluence ground-glass opacities in the right lower lobe and left lower lobe with some smooth interlobular septal thickening. There is no pleural effusion or pneumothorax. Musculoskeletal: No chest wall mass or suspicious bone lesions identified. There is mild body wall edema. There is bilateral gynecomastia. CT ABDOMEN PELVIS FINDINGS Hepatobiliary: No focal liver abnormality is seen. No gallstones, gallbladder wall thickening, or biliary dilatation. Pancreas: Unremarkable. No pancreatic ductal dilatation or surrounding inflammatory changes. Spleen: Normal in size without focal abnormality. Adrenals/Urinary Tract: There is a 2 mm calculus in the inferior pole the left kidney. There is no hydronephrosis. The adrenal glands are within normal limits. Bladder is within normal limits. Stomach/Bowel: Stomach is within normal limits. Appendix appears normal. No evidence of bowel wall thickening, distention, or inflammatory changes. Vascular/Lymphatic: No significant vascular findings are present. No enlarged abdominal or pelvic lymph nodes. Reproductive: Prostate is unremarkable. Other: There some prominent and mildly enlarged left inguinal lymph nodes measuring up to 12 mm. There is diffuse body wall edema. There is no ascites or free air. There is no focal abdominal wall hernia. Musculoskeletal: No acute or significant osseous findings. IMPRESSION: 1. Cardiomegaly with mild pulmonary edema. 2. Diffuse body wall edema. 3. Nonobstructing left renal calculus. 4. Mildly enlarged precarinal lymph node. 5. Prominent and mildly enlarged left inguinal lymph nodes. 6. No other evidence for metastatic disease in the chest, abdomen or pelvis. Electronically Signed   By: Darliss Cheney M.D.   On: 09/06/2023 21:37   US RENAL  Result Date:  09/06/2023 CLINICAL DATA:  Chronic kidney disease. EXAM: RENAL / URINARY TRACT ULTRASOUND COMPLETE COMPARISON:  None Available. FINDINGS: Right Kidney: Renal measurements: 11.8 x 5.0 x 4.9 cm = volume: 153 mL. Increased echogenicity of renal parenchyma is noted suggesting medical renal disease. No mass or hydronephrosis visualized. Left Kidney: Renal measurements: 11.8 x 6.5 x 6.1 cm = volume: 249 mL. Increased echogenicity of renal parenchyma is noted suggesting medical renal disease. No mass or hydronephrosis visualized. Bladder: Appears normal for degree of bladder distention. Other: None. IMPRESSION: Increased echogenicity of renal parenchyma is noted bilaterally consistent with medical renal disease. No hydronephrosis or renal obstruction is noted. Electronically Signed   By: Lupita Raider M.D.   On: 09/06/2023 16:16   ECHOCARDIOGRAM COMPLETE  Result Date: 09/06/2023    ECHOCARDIOGRAM REPORT   Patient Name:   Jeffery Villa Date of Exam: 09/06/2023 Medical Rec #:  [id number]          Height:       74.0 in Accession #:    [id number]         Weight:       244.3 lb Date of Birth:  07/17/1986          BSA:          2.366 m Patient Age:    37 years           BP:           122/98 mmHg Patient Gender: M                  HR:           107 bpm. Exam Location:  Inpatient Procedure: 2D Echo, Cardiac Doppler, Color Doppler and Intracardiac            Opacification Agent Indications:    CHF I50.9  History:        Patient has prior history of Echocardiogram examinations, most                 recent 06/16/2022. CHF, CKD stage 3; Risk Factors:Hypertension,                 Diabetes and Dyslipidemia.  Sonographer:    Lucendia Herrlich RCS Referring Phys: [phone] AMY D CLEGG IMPRESSIONS  1. Left ventricular ejection fraction, by estimation, is <20%. The left ventricle has severely decreased function. The left ventricle demonstrates global hypokinesis. The left ventricular internal cavity size was severely dilated. Left  ventricular diastolic function could not be evaluated.  2. Right ventricular systolic function is normal. The right ventricular size is mildly enlarged. There is severely elevated pulmonary artery systolic pressure. The estimated right ventricular systolic pressure is 68.6 mmHg.  3. Left atrial size was severely dilated.  4. Right atrial size was severely dilated.  5. The mitral valve is normal in structure. Moderate mitral valve regurgitation. No evidence of mitral stenosis.  6. Tricuspid valve regurgitation is moderate.  7. The aortic valve is normal in structure. Aortic valve regurgitation is not visualized. No aortic stenosis is present.  8. The inferior vena cava is dilated in size with <50% respiratory variability, suggesting right atrial pressure of 15 mmHg. FINDINGS  Left Ventricle: Left ventricular ejection fraction, by estimation, is <20%. The left ventricle has severely decreased function. The left ventricle demonstrates global hypokinesis. Definity contrast agent was given IV to delineate the left ventricular endocardial borders. The left ventricular internal cavity size was severely dilated. There is no left ventricular hypertrophy. Left ventricular diastolic function could not be evaluated. Right Ventricle: The right ventricular size is mildly enlarged. No increase in right ventricular wall thickness. Right ventricular systolic function is normal. There is severely elevated pulmonary artery systolic pressure. The tricuspid regurgitant velocity is 3.66 m/s, and with an assumed right atrial pressure of 15 mmHg, the estimated right ventricular systolic pressure is 68.6 mmHg. Left Atrium: Left atrial size was severely dilated. Right Atrium: Right atrial size was severely dilated. Pericardium: There is no evidence of pericardial effusion. Mitral Valve: The mitral valve is normal in structure. Moderate mitral valve regurgitation. No evidence of mitral valve stenosis. Tricuspid Valve: The tricuspid valve is  normal in structure. Tricuspid valve regurgitation is moderate . No evidence of tricuspid stenosis. Aortic Valve: The aortic valve is normal in structure. Aortic valve regurgitation is not visualized. No aortic stenosis is present. Aortic valve peak gradient measures 2.9 mmHg. Pulmonic Valve: The pulmonic valve was normal in structure. Pulmonic valve regurgitation is trivial. No evidence of pulmonic stenosis. Aorta: The aortic root is normal in size and structure. Venous: The inferior vena cava is dilated in size with less than 50% respiratory variability, suggesting  right atrial pressure of 15 mmHg. IAS/Shunts: No atrial level shunt detected by color flow Doppler.  LEFT VENTRICLE PLAX 2D LVIDd:         6.90 cm   Diastology LVIDs:         6.20 cm   LV e' medial:    7.30 cm/s LV PW:         1.10 cm   LV E/e' medial:  15.3 LV IVS:        0.90 cm   LV e' lateral:   11.70 cm/s LVOT diam:     2.30 cm   LV E/e' lateral: 9.6 LV SV:         44 LV SV Index:   19 LVOT Area:     4.15 cm  RIGHT VENTRICLE             IVC RV S prime:     14.00 cm/s  IVC diam: 3.00 cm TAPSE (M-mode): 1.8 cm LEFT ATRIUM              Index        RIGHT ATRIUM           Index LA diam:        4.90 cm  2.07 cm/m   RA Area:     31.70 cm LA Vol (A2C):   162.5 ml 68.67 ml/m  RA Volume:   118.00 ml 49.87 ml/m LA Vol (A4C):   117.0 ml 49.45 ml/m LA Biplane Vol: 140.0 ml 59.17 ml/m  AORTIC VALVE AV Area (Vmax): 3.84 cm AV Vmax:        84.50 cm/s AV Peak Grad:   2.9 mmHg LVOT Vmax:      78.17 cm/s LVOT Vmean:     50.733 cm/s LVOT VTI:       0.106 m  AORTA Ao Root diam: 3.40 cm Ao Asc diam:  2.90 cm MITRAL VALVE                  TRICUSPID VALVE MV Area (PHT): 5.23 cm       TR Peak grad:   53.6 mmHg MV Decel Time: 145 msec       TR Vmax:        366.00 cm/s MR Peak grad:    46.6 mmHg MR Mean grad:    31.0 mmHg    SHUNTS MR Vmax:         341.50 cm/s  Systemic VTI:  0.11 m MR Vmean:        265.0 cm/s   Systemic Diam: 2.30 cm MR PISA:         1.01 cm MR  PISA Eff ROA: 10 mm MR PISA Radius:  0.40 cm MV E velocity: 112.00 cm/s Armanda Magic MD Electronically signed by Armanda Magic MD Signature Date/Time: 09/06/2023/12:50:06 PM    Final     Medications:     Scheduled Medications:  atorvastatin  40 mg Oral Daily   busPIRone  15 mg Oral BID   Chlorhexidine Gluconate Cloth  6 each Topical Daily   enoxaparin (LOVENOX) injection  40 mg Subcutaneous Q24H   furosemide  80 mg Intravenous BID   insulin aspart  0-15 Units Subcutaneous TID WC   sodium chloride flush  10-40 mL Intracatheter Q12H   sodium chloride flush  3 mL Intravenous Q12H    Infusions:  milrinone 0.25 mcg/kg/min (09/07/23 0828)    PRN Medications: acetaminophen, ondansetron (ZOFRAN) IV, sodium chloride flush, sodium chloride flush  Patient Profile   Jeffery Villa is a 37 y.o. male with past medical history of hypertension, hyperlipidemia, insulin-dependent diabetes mellitus since 37 years of age, CKD stage IIIb, OSA and chronic systolic heart failure.  Admitted with acute on chronic systolic CHF with low-output.    Assessment/Plan   1. Acute on Chronic Biventricular Heart Failure:  - Cardiomyopathy of uncertain etiology.  Echo in 9/22 with EF 20-25%, moderate-severe MR, moderate-severe TR.  Echo 05/23 EF 20-25%, global hypokinesis with moderate LV dilation, mod-severe MR (severe by PISA ERO), moderate TR.  He has had HTN since his teens, could be due to long-standing poorly controlled HTN. With DM/HTN/hyperlipidemia x years, CAD must be a consideration though no chest pain. Prior viral myocarditis is possible as well. Additionally, his mother died of CHF in her early 43s.  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  cMRI limited study, difficult images due to problems with breath-holding. >50% wall thickness subendocardial LGE in the mid anteroseptal wall, looks like a coronary disease pattern suggestive of prior  MI though does not seem extensive enough to cause his degree of LV dysfunction. LVEF 15% on cMRI.  Echo in 8/23 showed EF 20% with severe LV dilation, moderate RV dilation with mildly decreased systolic function, mod-severe TR, mod-severe functional MR, PASP 64 mmHg, dilated IVC.  Narrow QRS, not CRT candidate. Missed appointment with EP to discuss ICD. CPX 09/23: severe functional limitation d/t advanced HF and obesity, peak VO2 15, VE/VCO2 slope 45. - Now admitted with low-output heart failure. Lactic acid not elevated.  - Echo this admit: EF < 20%, RV okay, RVSP 68 mmHg, moderate MR - CO-OX 65% on milrinone 0.25 mcg/kg/min - NYHA IV. Markedly volume overloaded. CVP 10. Continue IV lasix 80 BID. Will also give 2.5 mg metolazone. Supp K.  - GDMT limited by CKD Stage IIIb and suspected low output.  - No MRA/ARNI  - May need RHC once diuresed. No LHC for now with Scr > 2. - Concerned he will need dual organ transplant. Workup initiated. Blood type O +. LifeVest has been ordered. Anticipate will need home milrinone. Will reach out to home infusion coordinator, Jeri Modena, RN.  -2. 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  - He will need to establish with Nephrology.  - Looks like creatinine baseline is ~ 2.4-2.6. - sCr 2.47 today. - follow BMET - Renal US with evidence of medical renal disease.   3. Type 2 diabetes:  - Early onset.  - A1c 10.0 - SSI.   4. HTN:  - Early onset in teens.  - GDMT per above   5. Hyperlipidemia:  - Continue atorvastatin.   6. Diabetic foot s/p toe amputation.   7. Mitral regurgitation:  - Echo this admit with moderate MR.  8. OSA: - Needs CPAP, has not been able to tolerate. He has a new mask but hasn't tried it yet.   9. Iron deficiency anemia: - T sat 6% and Ferritin 21.  - Would benefit from IV iron if no MRI planned  10. NSVT - Multiple runs on telemetry - Supp K, check mag - LifeVest has  been ordered. - Will need to add amiodarone if recurs  Length of Stay: 2  FINCH, LINDSAY N, PA-C  09/07/2023, 8:38 AM  Advanced Heart Failure Team Pager [phone] (M-F; 7a - 5p)  Please contact [facility] for night-coverage after  hours (5p -7a ) and weekends on [email or web address]  Agree with above.   Remains on milrinone. CO-ox 65%. CVP up. Still volume overloaded. Having NSVT.   General:  Sitting up in bed. No resp difficulty HEENT: normal Neck: supple. JVP to jaw  Carotids 2+ bilat; no bruits. No lymphadenopathy or thryomegaly appreciated. Cor:  Regular tachy + s3 Lungs: clear Abdomen: soft, nontender, nondistended. No hepatosplenomegaly. No bruits or masses. Good bowel sounds. Extremities: no cyanosis, clubbing, rash, 2+ edema Neuro: alert & orientedx3, cranial nerves grossly intact. moves all 4 extremities w/o difficulty. Affect pleasant  He has severe biventricular HF. Improved with milrinone but still volume overloaded and having NSVT  Will continue to diurese. Add metolazone. Watch electrolytes  Start po amio.   Scr not improving so will need heart/kidney transplant eval. If rhythm remains unstable may need inpatient transfer.   CRITICAL CARE Performed by: Arvilla Meres  Total critical care time: 45 minutes  Critical care time was exclusive of separately billable procedures and treating other patients.  Critical care was necessary to treat or prevent imminent or life-threatening deterioration.  Critical care was time spent personally by me (independent of midlevel providers or residents) on the following activities: development of treatment plan with patient and/or surrogate as well as nursing, discussions with consultants, evaluation of patient's response to treatment, examination of patient, obtaining history from patient or surrogate, ordering and performing treatments and interventions, ordering and review of laboratory studies, ordering and review of radiographic  studies, pulse oximetry and re-evaluation of patient's condition.  Arvilla Meres, MD  8:07 PM

## 2023-09-07 NOTE — TOC Initial Note (Addendum)
Transition of Care [facility]) - Initial/Assessment Note    Patient Details  Name: Jeffery Villa MRN: [id number] Date of Birth: 1986/02/19  Transition of Care [facility]) CM/SW Contact:    Elliot Cousin, RN Phone Number:[phone] 09/07/2023, 11:52 AM  Clinical Narrative:   [facility]audell Kyle rep, Irving Burton to follow up on LifeVest. States she need DME order, and progress note. Waiting for Cardiac Cath report. Contacted [facility] rep, Pam for possible dc home with Milrinone.  [facility] rep, Pam states they will arrange [facility] for PICC line care at home.    09/09/2023 Received call from [facility] rep, Haywood Lasso, states they can accept pt's insurance plan. Accepted for [facility].  Explained plan if for patient to transfer to [facility] for heart transplant.            Expected Discharge Plan: Home w Home Health Services Barriers to Discharge: Continued Medical Work up   Patient Goals and CMS Choice  Expected Discharge Plan and Services   Discharge Planning Services: CM Consult Post Acute Care Choice: Home Health       HH Arranged: RN [facility] Agency: [facility] Date HH Agency Contacted: 09/07/23 Time HH Agency Contacted: 1139 Representative spoke with at [facility] Agency: Jeri Modena RN, [facility]  Prior Living Arrangements/Services       Do you feel safe going back to the place where you live?: Yes      Need for Family Participation in Patient Care: No (Comment) Care giver support system in place?: Yes (comment)   Criminal Activity/Legal Involvement Pertinent to Current Situation/Hospitalization: No - Comment as needed  Activities of Daily Living   ADL Screening (condition at time of admission) Independently performs ADLs?: Yes (appropriate for developmental age) Is the patient deaf or have difficulty hearing?: No Does the patient have difficulty seeing, even when wearing glasses/contacts?: No Does the patient have difficulty concentrating, remembering, or making decisions?:  No  Permission Sought/Granted Permission sought to share information with : Case Manager, Family Supports, PCP Permission granted to share information with : Yes, Verbal Permission Granted  Share Information with NAME: Kerin Ransom  Permission granted to share info w AGENCY: Home Health, DME  Permission granted to share info w Relationship: SO  Permission granted to share info w Contact Information: [phone]  Emotional Assessment              Admission diagnosis:  Acute on chronic clinical systolic heart failure (HCC) [I50.23] Acute on chronic congestive heart failure, unspecified heart failure type [facility] [I50.9] Patient Active Problem List   Diagnosis Date Noted   Acute on chronic clinical systolic heart failure (HCC) 09/05/2023   Sepsis due to cellulitis (HCC) 03/05/2022   Chronic systolic CHF (congestive heart failure) (HCC) 03/05/2022   Acute renal failure superimposed on stage 3a chronic kidney disease (HCC) 03/05/2022   S/P amputation of lesser toe, right (HCC) 08/18/2021   Moderate mitral regurgitation 07/29/2021   Moderate tricuspid regurgitation 07/29/2021   Cardiac LV ejection fraction of 20-34% 07/24/2021   Stage 3a chronic kidney disease (HCC) 12/02/2020   BMI 33.0-33.9,adult 07/12/2019   Hyperlipidemia associated with type 2 diabetes mellitus (HCC) 11/20/2017   Hypertension associated with diabetes (HCC) 11/20/2017   Uncontrolled type 2 diabetes mellitus with hyperglycemia, with long-term current use of insulin (HCC) 09/18/2014   Diabetic peripheral neuropathy (HCC) 09/18/2014   Diabetic foot ulcer (HCC) 09/18/2014   PCP:  Default, Provider, MD Pharmacy:   [id number] [place],  - [address] [phone]  [place] [address] Phone: [phone] Fax: [phone]     Social Determinants of Health (SDOH) Social History: SDOH Screenings   Food Insecurity: No Food Insecurity (09/05/2023)  Housing: Patient Unable To  Answer (09/07/2023)  Transportation Needs: No Transportation Needs (09/05/2023)  Utilities: Not At Risk (09/05/2023)  Alcohol Screen: Low Risk  (09/07/2023)  Financial Resource Strain: Low Risk  (07/14/2023)   Received from [facility]  Physical Activity: Sufficiently Active (10/21/2020)   Received from [facility], [facility]  Social Connections: Unknown (03/04/2022)   Received from [facility], [facility]  Stress: Stress Concern Present (10/21/2020)   Received from [facility], [facility]  Tobacco Use: Unknown (09/05/2023)   SDOH Interventions:     Readmission Risk Interventions     No data to display

## 2023-09-08 ENCOUNTER — Inpatient Hospital Stay (HOSPITAL_COMMUNITY): Payer: BC Managed Care – PPO

## 2023-09-08 DIAGNOSIS — I5023 Acute on chronic systolic (congestive) heart failure: Secondary | ICD-10-CM | POA: Diagnosis not present

## 2023-09-08 LAB — COOXEMETRY PANEL
Carboxyhemoglobin: 1.5 % (ref 0.5–1.5)
Carboxyhemoglobin: 1.4 % (ref 0.5–1.5)
Carboxyhemoglobin: 0.9 % (ref 0.5–1.5)
Carboxyhemoglobin: 1.7 % — ABNORMAL HIGH (ref 0.5–1.5)
Methemoglobin: <0.7 % (ref 0.0–1.5)
Methemoglobin: <0.7 % (ref 0.0–1.5)
Methemoglobin: 1 % (ref 0.0–1.5)
Methemoglobin: <0.7 % (ref 0.0–1.5)
O2 Saturation: 53.1 %
O2 Saturation: 48.3 %
O2 Saturation: 45.4 %
O2 Saturation: 64.5 %
Total hemoglobin: 11.3 g/dL — ABNORMAL LOW (ref 12.0–16.0)
Total hemoglobin: 12 g/dL (ref 12.0–16.0)
Total hemoglobin: 12 g/dL (ref 12.0–16.0)
Total hemoglobin: 11.9 g/dL — ABNORMAL LOW (ref 12.0–16.0)

## 2023-09-08 LAB — PULMONARY FUNCTION TEST
DL/VA % pred: 103 %
DL/VA: 4.85 ml/min/mmHg/L
DLCO unc % pred: 80 %
DLCO unc: 28.96 ml/min/mmHg
FEF 25-75 Pre: 3.42 L/s
FEF2575-%Pred-Pre: 74 %
FEV1-%Pred-Pre: 68 %
FEV1-Pre: 3.32 L
FEV1FVC-%Pred-Pre: 102 %
FEV6-%Pred-Pre: 66 %
FEV6-Pre: 3.99 L
FEV6FVC-%Pred-Pre: 101 %
FVC-%Pred-Pre: 66 %
FVC-Pre: 4.02 L
Pre FEV1/FVC ratio: 83 %
Pre FEV6/FVC Ratio: 100 %
RV % pred: 80 %
RV: 1.57 L
TLC % pred: 76 %
TLC: 5.88 L

## 2023-09-08 LAB — COMPREHENSIVE METABOLIC PANEL
ALT: 24 U/L (ref 0–44)
AST: 22 U/L (ref 15–41)
Albumin: 2 g/dL — ABNORMAL LOW (ref 3.5–5.0)
Alkaline Phosphatase: 73 U/L (ref 38–126)
Anion gap: 8 (ref 5–15)
BUN: 73 mg/dL — ABNORMAL HIGH (ref 6–20)
CO2: 22 mmol/L (ref 22–32)
Calcium: 8.3 mg/dL — ABNORMAL LOW (ref 8.9–10.3)
Chloride: 99 mmol/L (ref 98–111)
Creatinine, Ser: 2.11 mg/dL — ABNORMAL HIGH (ref 0.61–1.24)
GFR, Estimated: 41 mL/min — ABNORMAL LOW (ref >60)
Glucose, Bld: 436 mg/dL — ABNORMAL HIGH (ref 70–99)
Potassium: 3.8 mmol/L (ref 3.5–5.1)
Sodium: 129 mmol/L — ABNORMAL LOW (ref 135–145)
Total Bilirubin: 0.9 mg/dL (ref <1.2)
Total Protein: 6.1 g/dL — ABNORMAL LOW (ref 6.5–8.1)

## 2023-09-08 LAB — CBC
HCT: 38.2 % — ABNORMAL LOW (ref 39.0–52.0)
Hemoglobin: 11 g/dL — ABNORMAL LOW (ref 13.0–17.0)
MCH: 18.4 pg — ABNORMAL LOW (ref 26.0–34.0)
MCHC: 28.8 g/dL — ABNORMAL LOW (ref 30.0–36.0)
MCV: 64 fL — ABNORMAL LOW (ref 80.0–100.0)
Platelets: 269 10*3/uL (ref 150–400)
RBC: 5.97 MIL/uL — ABNORMAL HIGH (ref 4.22–5.81)
RDW: 22.2 % — ABNORMAL HIGH (ref 11.5–15.5)
WBC: 7.3 10*3/uL (ref 4.0–10.5)
nRBC: 0 % (ref 0.0–0.2)

## 2023-09-08 LAB — HEPATIC FUNCTION PANEL
ALT: 24 U/L (ref 0–44)
AST: 24 U/L (ref 15–41)
Albumin: 2.1 g/dL — ABNORMAL LOW (ref 3.5–5.0)
Alkaline Phosphatase: 77 U/L (ref 38–126)
Bilirubin, Direct: 0.2 mg/dL (ref 0.0–0.2)
Indirect Bilirubin: 0.7 mg/dL (ref 0.3–0.9)
Total Bilirubin: 0.9 mg/dL (ref <1.2)
Total Protein: 6.3 g/dL — ABNORMAL LOW (ref 6.5–8.1)

## 2023-09-08 LAB — BASIC METABOLIC PANEL
Anion gap: 8 (ref 5–15)
BUN: 79 mg/dL — ABNORMAL HIGH (ref 6–20)
CO2: 22 mmol/L (ref 22–32)
Calcium: 7.9 mg/dL — ABNORMAL LOW (ref 8.9–10.3)
Chloride: 106 mmol/L (ref 98–111)
Creatinine, Ser: 1.85 mg/dL — ABNORMAL HIGH (ref 0.61–1.24)
GFR, Estimated: 48 mL/min — ABNORMAL LOW (ref >60)
Glucose, Bld: 201 mg/dL — ABNORMAL HIGH (ref 70–99)
Potassium: 4 mmol/L (ref 3.5–5.1)
Sodium: 136 mmol/L (ref 135–145)

## 2023-09-08 LAB — GLUCOSE, CAPILLARY
Glucose-Capillary: 180 mg/dL — ABNORMAL HIGH (ref 70–99)
Glucose-Capillary: 143 mg/dL — ABNORMAL HIGH (ref 70–99)
Glucose-Capillary: 179 mg/dL — ABNORMAL HIGH (ref 70–99)
Glucose-Capillary: 185 mg/dL — ABNORMAL HIGH (ref 70–99)

## 2023-09-08 LAB — TSH: TSH: 5.647 u[IU]/mL — ABNORMAL HIGH (ref 0.350–4.500)

## 2023-09-08 LAB — LACTIC ACID, PLASMA: Lactic Acid, Venous: 1 mmol/L (ref 0.5–1.9)

## 2023-09-08 LAB — MRSA NEXT GEN BY PCR, NASAL: MRSA by PCR Next Gen: NOT DETECTED

## 2023-09-08 MED ORDER — INSULIN ASPART 100 UNIT/ML IJ SOLN
3.0000 [IU] | Freq: Three times a day (TID) | INTRAMUSCULAR | Status: DC
  Administered 2023-09-09 – 2023-09-12 (×11): 3 [IU] via SUBCUTANEOUS

## 2023-09-08 MED ORDER — FUROSEMIDE 10 MG/ML IJ SOLN
120.0000 mg | Freq: Once | INTRAVENOUS | Status: AC
  Administered 2023-09-08: 120 mg via INTRAVENOUS
  Filled 2023-09-08: qty 10

## 2023-09-08 MED ORDER — NOREPINEPHRINE 4 MG/250ML-% IV SOLN
2.0000 ug/min | INTRAVENOUS | Status: DC
  Administered 2023-09-08 – 2023-09-12 (×4): 2 ug/min via INTRAVENOUS
  Filled 2023-09-08 (×5): qty 250

## 2023-09-08 MED ORDER — METOLAZONE 2.5 MG PO TABS: 2.5000 mg | ORAL_TABLET | Freq: Two times a day (BID) | ORAL | Status: DC

## 2023-09-08 MED ORDER — SODIUM CHLORIDE 0.9 % IV SOLN: 250.0000 mL | INTRAVENOUS | Status: AC

## 2023-09-08 MED ORDER — FUROSEMIDE 10 MG/ML IJ SOLN
25.0000 mg/h | INTRAVENOUS | Status: DC
  Administered 2023-09-08 – 2023-09-10 (×5): 20 mg/h via INTRAVENOUS
  Administered 2023-09-10: 25 mg/h via INTRAVENOUS
  Administered 2023-09-10: 20 mg/h via INTRAVENOUS
  Administered 2023-09-11 – 2023-09-12 (×6): 25 mg/h via INTRAVENOUS
  Filled 2023-09-08 (×16): qty 20

## 2023-09-08 MED ORDER — SODIUM CHLORIDE 0.9% FLUSH
10.0000 mL | Freq: Two times a day (BID) | INTRAVENOUS | Status: DC
  Administered 2023-09-08: 10 mL via INTRAVENOUS

## 2023-09-08 MED ORDER — AMIODARONE HCL 200 MG PO TABS
400.0000 mg | ORAL_TABLET | Freq: Two times a day (BID) | ORAL | Status: DC
  Administered 2023-09-08 – 2023-09-12 (×10): 400 mg via ORAL
  Filled 2023-09-08 (×10): qty 2

## 2023-09-08 MED ORDER — POTASSIUM CHLORIDE CRYS ER 20 MEQ PO TBCR
40.0000 meq | EXTENDED_RELEASE_TABLET | Freq: Once | ORAL | Status: AC
  Administered 2023-09-08: 40 meq via ORAL
  Filled 2023-09-08: qty 2

## 2023-09-08 MED ORDER — FUROSEMIDE 10 MG/ML IJ SOLN
120.0000 mg | Freq: Two times a day (BID) | INTRAVENOUS | Status: DC
  Filled 2023-09-08 (×2): qty 12

## 2023-09-08 MED ORDER — MUPIROCIN CALCIUM 2 % EX CREA
TOPICAL_CREAM | Freq: Every day | CUTANEOUS | Status: DC
  Filled 2023-09-08: qty 15

## 2023-09-08 NOTE — Inpatient Diabetes Management (Signed)
Inpatient Diabetes Program Recommendations  AACE/ADA: New Consensus Statement on Inpatient Glycemic Control (2015)  Target Ranges:  Prepandial:   less than 140 mg/dL      Peak postprandial:   less than 180 mg/dL (1-2 hours)      Critically ill patients:  140 - 180 mg/dL   Lab Results  Component Value Date   GLUCAP 180 (H) 09/08/2023   HGBA1C 10.0 (H) 09/05/2023    Review of Glycemic Control  Latest Reference Range & Units 09/07/23 05:55 09/07/23 10:54 09/07/23 15:52 09/07/23 21:20 09/08/23 06:11  Glucose-Capillary 70 - 99 mg/dL 540 (H) 981 (H) 191 (H) 349 (H) 180 (H)  (H): Data is abnormally high  Diabetes history: DM2 Outpatient Diabetes medications: Glipizide 5 mg BID Current orders for Inpatient glycemic control: Novolog 0-20 units TID and 0-5 units QHS  Inpatient Diabetes Program Recommendations:    Please consider:  Novolog 3 units TID with meals if he consumes at least 50%  Will continue to follow while inpatient.  Thank you, Dulce Sellar, MSN, CDCES Diabetes Coordinator Inpatient Diabetes Program [phone] (team pager from 8a-5p)

## 2023-09-08 NOTE — Progress Notes (Addendum)
Advanced Heart Failure Rounding Note  PCP-Cardiologist: None   Subjective:    CO-OX 53% on 0.25 milrinone.  CVP 10. 2.7L UOP yesterday with IV lasix 80 BID + 2.5 mg metolazone.  Scr 2.47>1.85.  No recurrent VT overnight.  Reports less fluid in abdomen but legs still swollen.  Objective:   Weight Range: 112.4 kg Body mass index is 31.82 kg/m.   Vital Signs:   Temp:  [97.6 F (36.4 C)-98.5 F (36.9 C)] 97.8 F (36.6 C) (11/14 1136) Pulse Rate:  [123] 123 (11/14 0512) Resp:  [15-20] 17 (11/14 0730) BP: (122-132)/(93-104) 132/104 (11/14 0730) SpO2:  [95 %-97 %] 97 % (11/14 1136) Weight:  [112.4 kg] 112.4 kg (11/14 0526) Last BM Date : 09/07/23  Weight change: Filed Weights   09/06/23 0624 09/07/23 0410 09/08/23 0526  Weight: 110.8 kg 111.5 kg 112.4 kg    Intake/Output:   Intake/Output Summary (Last 24 hours) at 09/08/2023 1606 Last data filed at 09/08/2023 1600 Gross per 24 hour  Intake 774.17 ml  Output 3275 ml  Net -2500.83 ml     Physical Exam  CVP 10 sitting up in chair General:  Thin, fatigued appeared HEENT: normal Neck: supple. JVP to jaw. Carotids 2+ bilat; no bruits.  Cor: PMI nondisplaced. Regular rate & rhythm, tachy. No rubs, gallops or murmurs. Lungs: clear Abdomen: soft, nontender, nondistended.  Extremities: no cyanosis, clubbing, rash, 2+ edema Neuro: alert & orientedx3, cranial nerves grossly intact. moves all 4 extremities w/o difficulty. Affect pleasant    Telemetry   ST 110s-120s  EKG    N/a  Labs    CBC Recent Labs    09/07/23 0515 09/08/23 0530  WBC 6.8 7.3  HGB 10.7* 11.0*  HCT 37.3* 38.2*  MCV 63.3* 64.0*  PLT 255 269   Basic Metabolic Panel Recent Labs    40/98/11 0515 09/07/23 1126 09/08/23 0530  NA 135  --  136  K 3.4*  --  4.0  CL 104  --  106  CO2 26  --  22  GLUCOSE 259*  --  201*  BUN 90*  --  79*  CREATININE 2.47*  --  1.85*  CALCIUM 8.1*  --  7.9*  MG  --  2.1  --    Liver Function  Tests Recent Labs    09/08/23 0930  AST 24  ALT 24  ALKPHOS 77  BILITOT 0.9  PROT 6.3*  ALBUMIN 2.1*   No results for input(s): "LIPASE", "AMYLASE" in the last 72 hours. Cardiac Enzymes No results for input(s): "CKTOTAL", "CKMB", "CKMBINDEX", "TROPONINI" in the last 72 hours.  BNP: BNP (last 3 results) Recent Labs    08/18/23 1622 09/05/23 1124  BNP 4,064.5* 3,881.1*    ProBNP (last 3 results) No results for input(s): "PROBNP" in the last 8760 hours.   D-Dimer No results for input(s): "DDIMER" in the last 72 hours. Hemoglobin A1C No results for input(s): "HGBA1C" in the last 72 hours.  Fasting Lipid Panel Recent Labs    09/06/23 1505  CHOL 117  HDL 22*  LDLCALC 73  TRIG 914  CHOLHDL 5.3   Thyroid Function Tests Recent Labs    09/08/23 1046  TSH 5.647*    Other results:   Imaging   No results found.  Medications:     Scheduled Medications:  amiodarone  400 mg Oral BID   atorvastatin  40 mg Oral Daily   busPIRone  15 mg Oral BID   Chlorhexidine Gluconate Cloth  6 each Topical Daily   enoxaparin (LOVENOX) injection  40 mg Subcutaneous Q24H   insulin aspart  0-20 Units Subcutaneous TID WC   insulin aspart  0-5 Units Subcutaneous QHS   mupirocin cream   Topical Daily   sodium chloride flush  10-40 mL Intracatheter Q12H   sodium chloride flush  3 mL Intravenous Q12H    Infusions:  furosemide (LASIX) 200 mg in dextrose 5 % 100 mL (2 mg/mL) infusion 20 mg/hr (09/08/23 1238)   milrinone 0.25 mcg/kg/min (09/08/23 0540)    PRN Medications: acetaminophen, ondansetron (ZOFRAN) IV, sodium chloride flush, sodium chloride flush  Patient Profile   Jeffery Villa is a 36 y.o. male with past medical history of hypertension, hyperlipidemia, insulin-dependent diabetes mellitus since 37 years of age, CKD stage IIIb, OSA and chronic systolic heart failure.  Admitted with acute on chronic systolic CHF with low-output.    Assessment/Plan   1. Acute on  Chronic Biventricular Heart Failure:  - Cardiomyopathy of uncertain etiology.  Echo in 9/22 with EF 20-25%, moderate-severe MR, moderate-severe TR.  Echo 05/23 EF 20-25%, global hypokinesis with moderate LV dilation, mod-severe MR (severe by PISA ERO), moderate TR.  He has had HTN since his teens, could be due to long-standing poorly controlled HTN. With DM/HTN/hyperlipidemia x years, CAD must be a consideration though no chest pain. Prior viral myocarditis is possible as well. Additionally, his mother died of CHF in her early 72s.  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  cMRI limited study, difficult images due to problems with breath-holding. >50% wall thickness subendocardial LGE in the mid anteroseptal wall, looks like a coronary disease pattern suggestive of prior MI though does not seem extensive enough to cause his degree of LV dysfunction. LVEF 15% on cMRI.  Echo in 8/23 showed EF 20% with severe LV dilation, moderate RV dilation with mildly decreased systolic function, mod-severe TR, mod-severe functional MR, PASP 64 mmHg, dilated IVC.  Narrow QRS, not CRT candidate. Missed appointment with EP to discuss ICD. CPX 09/23: severe functional limitation d/t advanced HF and obesity, peak VO2 15, VE/VCO2 slope 45. - Now admitted with low-output heart failure. Lactic acid not elevated.  - Echo this admit: EF < 20%, RV okay, RVSP 68 mmHg, moderate MR - CO-OX 53% on milrinone 0.25 mcg/kg/min. Recheck.  - NYHA IV. Markedly volume overloaded. CVP 10. Diuresis has been sluggish. Give 120 mg lasix IV and start lasix gtt at 20/hr.  - GDMT limited by CKD Stage IIIb and low output.  - No MRA/ARNI  - Apply UNNA boots - Concerned he will need dual organ transplant. Workup initiated. Given persistent tachycardia and difficulty with diuresis despite inotrope support, we are concerned that he may need inpatient transfer to [facility] for  transplant.  -2. 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  - He will need to establish with Nephrology.  - Looks like creatinine baseline is ~ 2.4-2.6. - Scr improved to 1.85 today - follow BMET - Renal US with evidence of medical renal disease.   3. Type 2 diabetes:  - Early onset.  - A1c 10.0 - SSI.   4. HTN:  - Early onset in teens.  - GDMT per above   5. Hyperlipidemia:  - Continue atorvastatin.   6. Diabetic foot s/p toe amputation.   7. Mitral regurgitation:  - Echo this admit with moderate MR.  8. OSA: -  Needs CPAP, has not been able to tolerate. He has a new mask but hasn't tried it yet.   9. Iron deficiency anemia: - T sat 6% and Ferritin 21.  - Would benefit from IV iron if no MRI planned  10. NSVT - Multiple runs on telemetry this admit, no recurrence overnight - Supp K with diuresis - LifeVest has been ordered. -Add po amiodarone d/t risk of recurrence with inotrope support. Check TSH and hepatic function panel.  ADDENDUM: Repeat CO-OX is 48%. Milrinone increased to 0.375 mcg/kg/min.  Check CO-OX in 2 hrs. Repeat CMET this evening.  Arrange for RHC tomorrow am.   Length of Stay: 3  FINCH, LINDSAY N, PA-C  09/08/2023, 4:06 PM  Advanced Heart Failure Team Pager [phone] (M-F; 7a - 5p)  Please contact [facility] for night-coverage after hours (5p -7a ) and weekends on [email or web address]  Agree with above.   Remains very tenuous despite milrinone.  Co-ox low. CVP elevated with sluggish diuresis. VT improved with amio.   General:  Sitting in chair No resp difficulty HEENT: normal Neck: supple. JVP to ear Carotids 2+ bilat; no bruits. No lymphadenopathy or thryomegaly appreciated. Cor: tachy regular + s3 Lungs: clear Abdomen: soft, nontender, nondistended. No hepatosplenomegaly. No bruits or masses. Good bowel sounds. Extremities: no cyanosis, clubbing, rash, 3+ edema Neuro: alert & orientedx3,  cranial nerves grossly intact. moves all 4 extremities w/o difficulty. Affect pleasant  Continue to be very tenuous with severe biventricular failure. Only modest response to milrinone.   I d/w [facility] today and concern of poor glycemic control (A1c 10) and will need to be tuned up further prior to transfer  Will increase milrinone. Start lasix gtt. Continue amio.   RHC in am with consideration of mechanical support.   CRITICAL CARE Performed by: Arvilla Meres  Total critical care time: 45 minutes  Critical care time was exclusive of separately billable procedures and treating other patients.  Critical care was necessary to treat or prevent imminent or life-threatening deterioration.  Critical care was time spent personally by me (independent of midlevel providers or residents) on the following activities: development of treatment plan with patient and/or surrogate as well as nursing, discussions with consultants, evaluation of patient's response to treatment, examination of patient, obtaining history from patient or surrogate, ordering and performing treatments and interventions, ordering and review of laboratory studies, ordering and review of radiographic studies, pulse oximetry and re-evaluation of patient's condition.  Arvilla Meres, MD  5:31 PM

## 2023-09-08 NOTE — Progress Notes (Signed)
Orthopedic Tech Progress Note Patient Details:  Jeffery Villa 01-09-1986 [id number]  Ortho Devices Type of Ortho Device: Roland Rack boot Ortho Device/Splint Location: Bi-lat Ortho Device/Splint Interventions: Ordered, Application      [name] 09/08/2023, 1:51 PM

## 2023-09-08 NOTE — Progress Notes (Signed)
Mobility Specialist Progress Note:   09/08/23 1000  Mobility  Activity Ambulated with assistance in hallway  Level of Assistance Modified independent, requires aide device or extra time  Assistive Device Other (Comment) (IV Pole)  Distance Ambulated (ft) 475 ft  Activity Response Tolerated well  Mobility Referral Yes  $Mobility charge 1 Mobility  Mobility Specialist Start Time (ACUTE ONLY) 1023  Mobility Specialist Stop Time (ACUTE ONLY) 1029  Mobility Specialist Time Calculation (min) (ACUTE ONLY) 6 min    Pre Mobility: 127 HR Post Mobility:  132 HR  Pt received in chair, eager to mobility. Asymptomatic w/ no complaints throughout. Returned to room w/o fault. Pt left in chair with call bell and all needs met.  D'Vante Earlene Plater Mobility Specialist Please contact via Special educational needs teacher or Rehab office at [phone]

## 2023-09-08 NOTE — Consult Note (Addendum)
WOC Nurse Consult Note: Reason for Consult: Consult requested for left great toe areas. Pt is currently wearing bilat Una boots for edema reduction which were ordered by the cardiac team.  Left outer foot and great toe with generalized edema and erythremia. Near great toe there is a hard dry yellow callous, raised above skin level.  Next to this is a healing full thickness wound, .2X.2X.2cm, red and dry.  Probed inner  space between great toe and 2nd toe with a swab and area  is red and fluctuant  with intact skin but currently no open wound or drainage.  Pt states he previously noted some tan drainage from this site. Affected area is approx 1X1cm Dressing procedure/placement/frequency: Topical treatment orders provided for bedside nurses to perform as follows to provide antimicrobial benefits: Apply Bactroban to left inner space and outer great toe areas Q day, then cover with foam dressing.  Change foam dressing Q 3 days or PRN soiling. Instructed patient to notify his physician after discharge if he develops an open wound or drainage and odor, as he would need different topical treatment if this occurs.   Please re-consult if further assistance is needed.  Thank-you,  Cammie Mcgee MSN, RN, CWOCN, [place], CNS [phone]

## 2023-09-08 NOTE — Plan of Care (Signed)

## 2023-09-08 NOTE — TOC Progression Note (Signed)
Transition of Care [facility]) - Progression Note    Patient Details  Name: Jeffery Villa MRN: [id number] Date of Birth: 1986/03/26  [facility]) CM/SW Contact  Nicanor Bake Phone Number: [phone] 09/08/2023, 11:58 AM  Clinical Narrative:   HF CSW met with the pt at bedside. CSW informed the pt that it is not recommended for the pt to leave to attend a funeral in [place] on Saturday. CSW informed the pt that he will probably be transferring soon to [facility] according to what was discussed in today's progressions. Pt stated that he understood. CSW explained that he will be transported by [facility]. Pt agrees.   TOC will continue following.     Expected Discharge Plan: Home w Home Health Services Barriers to Discharge: Continued Medical Work up  Expected Discharge Plan and Services   Discharge Planning Services: CM Consult Post Acute Care Choice: Home Health                             HH Arranged: RN [facility] Agency: [facility] Date HH Agency Contacted: 09/07/23 Time HH Agency Contacted: 1139 Representative spoke with at [facility] Agency: Jeri Modena RN, [facility]   Social Determinants of Health (SDOH) Interventions SDOH Screenings   Food Insecurity: No Food Insecurity (09/05/2023)  Housing: Patient Unable To Answer (09/07/2023)  Transportation Needs: No Transportation Needs (09/05/2023)  Utilities: Not At Risk (09/05/2023)  Alcohol Screen: Low Risk  (09/07/2023)  Financial Resource Strain: Low Risk  (07/14/2023)   Received from [facility]  Physical Activity: Sufficiently Active (10/21/2020)   Received from [facility], [facility]  Social Connections: Unknown (03/04/2022)   Received from [facility], [facility]  Stress: Stress Concern Present (10/21/2020)   Received from [facility], [facility]  Tobacco Use: Unknown (09/05/2023)    Readmission Risk Interventions     No data to display

## 2023-09-08 NOTE — H&P (View-Only) (Signed)
 Repeat CO-OX this evening is 45%.   Discussed with Dr. Gala Romney.   Will transfer patient to ICU to start norepinephrine at 2 mcg/min.  Will check lactic acid and repeat CO-OX in 1-2 hrs.   2C RN and charge RN in Cardiac ICU updated.

## 2023-09-08 NOTE — Progress Notes (Signed)
eLink Physician-Brief Progress Note Patient Name: Jeffery Villa DOB: [birth year] MRN: [id number]   Date of Service  09/08/2023  HPI/Events of Note  Patient with advanced heart failure being managed by the advanced heart failure service.  eICU Interventions  New Patient Evaluation.        Thomasene Lot Linton Stolp 09/08/2023, 9:18 PM

## 2023-09-08 NOTE — Progress Notes (Signed)
Repeat CO-OX this evening is 45%.   Discussed with Dr. Gala Romney.   Will transfer patient to ICU to start norepinephrine at 2 mcg/min.  Will check lactic acid and repeat CO-OX in 1-2 hrs.   2C RN and charge RN in Cardiac ICU updated.

## 2023-09-09 ENCOUNTER — Encounter (HOSPITAL_COMMUNITY): Payer: Self-pay | Admitting: Internal Medicine

## 2023-09-09 ENCOUNTER — Encounter (HOSPITAL_COMMUNITY)
Admission: EM | Disposition: A | Discharge status: Other Acute Inpatient Hospital | Payer: Self-pay | Source: Home / Self Care | Attending: Internal Medicine

## 2023-09-09 DIAGNOSIS — I5023 Acute on chronic systolic (congestive) heart failure: Secondary | ICD-10-CM | POA: Diagnosis not present

## 2023-09-09 HISTORY — PX: RIGHT/LEFT HEART CATH AND CORONARY ANGIOGRAPHY: CATH118266

## 2023-09-09 LAB — POCT I-STAT EG7
Acid-Base Excess: 0 mmol/L (ref 0.0–2.0)
Acid-Base Excess: 0 mmol/L (ref 0.0–2.0)
Bicarbonate: 24.2 mmol/L (ref 20.0–28.0)
Bicarbonate: 24.3 mmol/L (ref 20.0–28.0)
Calcium, Ion: 1.26 mmol/L (ref 1.15–1.40)
Calcium, Ion: 1.28 mmol/L (ref 1.15–1.40)
HCT: 41 % (ref 39.0–52.0)
HCT: 42 % (ref 39.0–52.0)
Hemoglobin: 13.9 g/dL (ref 13.0–17.0)
Hemoglobin: 14.3 g/dL (ref 13.0–17.0)
O2 Saturation: 66 %
O2 Saturation: 68 %
Potassium: 3.5 mmol/L (ref 3.5–5.1)
Potassium: 3.6 mmol/L (ref 3.5–5.1)
Sodium: 141 mmol/L (ref 135–145)
Sodium: 141 mmol/L (ref 135–145)
TCO2: 25 mmol/L (ref 22–32)
TCO2: 25 mmol/L (ref 22–32)
pCO2, Ven: 38.2 mm[Hg] — ABNORMAL LOW (ref 44–60)
pCO2, Ven: 38.6 mm[Hg] — ABNORMAL LOW (ref 44–60)
pH, Ven: 7.407 (ref 7.25–7.43)
pH, Ven: 7.41 (ref 7.25–7.43)
pO2, Ven: 34 mm[Hg] (ref 32–45)
pO2, Ven: 35 mm[Hg] (ref 32–45)

## 2023-09-09 LAB — COOXEMETRY PANEL
Carboxyhemoglobin: 2.2 % — ABNORMAL HIGH (ref 0.5–1.5)
Methemoglobin: <0.7 % (ref 0.0–1.5)
O2 Saturation: 71.8 %
Total hemoglobin: 11.6 g/dL — ABNORMAL LOW (ref 12.0–16.0)

## 2023-09-09 LAB — CBC
HCT: 38.9 % — ABNORMAL LOW (ref 39.0–52.0)
Hemoglobin: 11.3 g/dL — ABNORMAL LOW (ref 13.0–17.0)
MCH: 18.5 pg — ABNORMAL LOW (ref 26.0–34.0)
MCHC: 29 g/dL — ABNORMAL LOW (ref 30.0–36.0)
MCV: 63.7 fL — ABNORMAL LOW (ref 80.0–100.0)
Platelets: 289 10*3/uL (ref 150–400)
RBC: 6.11 MIL/uL — ABNORMAL HIGH (ref 4.22–5.81)
RDW: 22.5 % — ABNORMAL HIGH (ref 11.5–15.5)
WBC: 7.8 10*3/uL (ref 4.0–10.5)
nRBC: 0 % (ref 0.0–0.2)

## 2023-09-09 LAB — BASIC METABOLIC PANEL
Anion gap: 8 (ref 5–15)
BUN: 69 mg/dL — ABNORMAL HIGH (ref 6–20)
CO2: 23 mmol/L (ref 22–32)
Calcium: 8.6 mg/dL — ABNORMAL LOW (ref 8.9–10.3)
Chloride: 102 mmol/L (ref 98–111)
Creatinine, Ser: 2.04 mg/dL — ABNORMAL HIGH (ref 0.61–1.24)
GFR, Estimated: 43 mL/min — ABNORMAL LOW (ref >60)
Glucose, Bld: 347 mg/dL — ABNORMAL HIGH (ref 70–99)
Potassium: 3.4 mmol/L — ABNORMAL LOW (ref 3.5–5.1)
Sodium: 133 mmol/L — ABNORMAL LOW (ref 135–145)

## 2023-09-09 LAB — GLUCOSE, CAPILLARY
Glucose-Capillary: 165 mg/dL — ABNORMAL HIGH (ref 70–99)
Glucose-Capillary: 219 mg/dL — ABNORMAL HIGH (ref 70–99)
Glucose-Capillary: 140 mg/dL — ABNORMAL HIGH (ref 70–99)
Glucose-Capillary: 228 mg/dL — ABNORMAL HIGH (ref 70–99)

## 2023-09-09 LAB — POCT I-STAT 7, (LYTES, BLD GAS, ICA,H+H)
Acid-base deficit: 2 mmol/L (ref 0.0–2.0)
Bicarbonate: 21.6 mmol/L (ref 20.0–28.0)
Calcium, Ion: 1.1 mmol/L — ABNORMAL LOW (ref 1.15–1.40)
HCT: 39 % (ref 39.0–52.0)
Hemoglobin: 13.3 g/dL (ref 13.0–17.0)
O2 Saturation: 95 %
Potassium: 3.2 mmol/L — ABNORMAL LOW (ref 3.5–5.1)
Sodium: 143 mmol/L (ref 135–145)
TCO2: 23 mmol/L (ref 22–32)
pCO2 arterial: 32.7 mm[Hg] (ref 32–48)
pH, Arterial: 7.428 (ref 7.35–7.45)
pO2, Arterial: 72 mm[Hg] — ABNORMAL LOW (ref 83–108)

## 2023-09-09 SURGERY — RIGHT/LEFT HEART CATH AND CORONARY ANGIOGRAPHY
Anesthesia: LOCAL

## 2023-09-09 MED ORDER — FENTANYL CITRATE (PF) 100 MCG/2ML IJ SOLN
INTRAMUSCULAR | Status: DC | PRN
  Administered 2023-09-09: 25 ug via INTRAVENOUS

## 2023-09-09 MED ORDER — POTASSIUM CHLORIDE CRYS ER 20 MEQ PO TBCR
40.0000 meq | EXTENDED_RELEASE_TABLET | Freq: Two times a day (BID) | ORAL | Status: AC
  Administered 2023-09-09 (×2): 40 meq via ORAL
  Filled 2023-09-09 (×2): qty 2

## 2023-09-09 MED ORDER — HEPARIN SODIUM (PORCINE) 1000 UNIT/ML IJ SOLN
INTRAMUSCULAR | Status: AC
  Filled 2023-09-09: qty 10

## 2023-09-09 MED ORDER — VERAPAMIL HCL 2.5 MG/ML IV SOLN
INTRAVENOUS | Status: AC
  Filled 2023-09-09: qty 2

## 2023-09-09 MED ORDER — IOHEXOL 350 MG/ML SOLN
INTRAVENOUS | Status: DC | PRN
  Administered 2023-09-09: 15 mL

## 2023-09-09 MED ORDER — SODIUM CHLORIDE 0.9% FLUSH: 3.0000 mL | INTRAVENOUS | Status: DC | PRN

## 2023-09-09 MED ORDER — ENOXAPARIN SODIUM 40 MG/0.4ML IJ SOSY
40.0000 mg | PREFILLED_SYRINGE | INTRAMUSCULAR | Status: DC
  Administered 2023-09-10 – 2023-09-12 (×3): 40 mg via SUBCUTANEOUS
  Filled 2023-09-09 (×3): qty 0.4

## 2023-09-09 MED ORDER — MIDAZOLAM HCL 2 MG/2ML IJ SOLN
INTRAMUSCULAR | Status: DC | PRN
  Administered 2023-09-09 (×2): 1 mg via INTRAVENOUS

## 2023-09-09 MED ORDER — ONDANSETRON HCL 4 MG/2ML IJ SOLN: 4.0000 mg | Freq: Four times a day (QID) | INTRAMUSCULAR | Status: DC | PRN

## 2023-09-09 MED ORDER — HYDRALAZINE HCL 20 MG/ML IJ SOLN: 10.0000 mg | INTRAMUSCULAR | Status: AC | PRN

## 2023-09-09 MED ORDER — FENTANYL CITRATE (PF) 100 MCG/2ML IJ SOLN
INTRAMUSCULAR | Status: AC
  Filled 2023-09-09: qty 2

## 2023-09-09 MED ORDER — POTASSIUM CHLORIDE CRYS ER 20 MEQ PO TBCR
40.0000 meq | EXTENDED_RELEASE_TABLET | Freq: Once | ORAL | Status: AC
Start: 2023-09-09 — End: 2023-09-09
  Administered 2023-09-09: 40 meq via ORAL
  Filled 2023-09-09: qty 2

## 2023-09-09 MED ORDER — LABETALOL HCL 5 MG/ML IV SOLN: 10.0000 mg | INTRAVENOUS | Status: DC | PRN

## 2023-09-09 MED ORDER — ORAL CARE MOUTH RINSE: 15.0000 mL | OROMUCOSAL | Status: DC | PRN

## 2023-09-09 MED ORDER — LIDOCAINE HCL (PF) 1 % IJ SOLN
INTRAMUSCULAR | Status: DC | PRN
  Administered 2023-09-09: 2 mL
  Administered 2023-09-09: 5 mL

## 2023-09-09 MED ORDER — SODIUM CHLORIDE 0.9 % IV SOLN: 250.0000 mL | INTRAVENOUS | Status: DC | PRN

## 2023-09-09 MED ORDER — VERAPAMIL HCL 2.5 MG/ML IV SOLN
INTRAVENOUS | Status: DC | PRN
  Administered 2023-09-09: 10 mL via INTRA_ARTERIAL

## 2023-09-09 MED ORDER — LIDOCAINE HCL (PF) 1 % IJ SOLN
INTRAMUSCULAR | Status: AC
  Filled 2023-09-09: qty 30

## 2023-09-09 MED ORDER — SODIUM CHLORIDE 0.9% FLUSH
3.0000 mL | Freq: Two times a day (BID) | INTRAVENOUS | Status: DC
  Administered 2023-09-09 – 2023-09-11 (×3): 3 mL via INTRAVENOUS

## 2023-09-09 MED ORDER — ACETAMINOPHEN 325 MG PO TABS: 650.0000 mg | ORAL_TABLET | ORAL | Status: DC | PRN

## 2023-09-09 MED ORDER — MIDAZOLAM HCL 2 MG/2ML IJ SOLN
INTRAMUSCULAR | Status: AC
  Filled 2023-09-09: qty 2

## 2023-09-09 MED ORDER — LIDOCAINE 5 % EX PTCH
1.0000 | MEDICATED_PATCH | CUTANEOUS | Status: DC
  Administered 2023-09-09 – 2023-09-10 (×2): 1 via TRANSDERMAL
  Filled 2023-09-09 (×3): qty 1

## 2023-09-09 MED ORDER — HEPARIN (PORCINE) IN NACL 1000-0.9 UT/500ML-% IV SOLN
INTRAVENOUS | Status: DC | PRN
  Administered 2023-09-09 (×2): 500 mL

## 2023-09-09 SURGICAL SUPPLY — 13 items
CATH INFINITI 5 FR JL3.5 (CATHETERS) IMPLANT
CATH INFINITI JR4 5F (CATHETERS) IMPLANT
CATH SWAN GANZ 7F STRAIGHT (CATHETERS) IMPLANT
DEVICE RAD COMP TR BAND LRG (VASCULAR PRODUCTS) IMPLANT
GLIDESHEATH SLEND SS 6F .021 (SHEATH) IMPLANT
GUIDEWIRE INQWIRE 1.5J.035X260 (WIRE) IMPLANT
INQWIRE 1.5J .035X260CM (WIRE) ×1
KIT HEART LEFT (KITS) IMPLANT
PACK CARDIAC CATHETERIZATION (CUSTOM PROCEDURE TRAY) ×1 IMPLANT
SHEATH PINNACLE 7F 10CM (SHEATH) IMPLANT
SHEATH PROBE COVER 6X72 (BAG) IMPLANT
SHIELD CATH-GARD CONTAMINATION (MISCELLANEOUS) IMPLANT
TUBING CIL FLEX 10 FLL-RA (TUBING) IMPLANT

## 2023-09-09 NOTE — Progress Notes (Addendum)
Advanced Heart Failure Rounding Note  PCP-Cardiologist: None   Subjective:    Transferred to ICU yesterday evening for mixed venous sat of 45% despite milrinone 0.375 mcg/kg/min. Started on 2 NE.   Mixed venous sat 72% on 0.375 milrinone + 2 NE.   Seen on the way to [facility]HC this morning. 3.5L UOP last 24 hrs with lasix gtt at 20/hr.  Tearful this am. Overwhelmed by all of the information he has received the last few days.  Objective:   Weight Range: 109.2 kg Body mass index is 30.91 kg/m.   Vital Signs:   Temp:  [97.4 F (36.3 C)-98 F (36.7 C)] 98 F (36.7 C) (11/15 0400) Pulse Rate:  [104-123] 104 (11/15 0600) Resp:  [14-21] 14 (11/15 0600) BP: (119-143)/(84-108) 125/89 (11/15 0600) SpO2:  [95 %-100 %] 95 % (11/15 0600) Weight:  [109.2 kg-111 kg] 109.2 kg (11/15 0500) Last BM Date : 09/09/23  Weight change: Filed Weights   09/08/23 0526 09/08/23 2300 09/09/23 0500  Weight: 112.4 kg 111 kg 109.2 kg    Intake/Output:   Intake/Output Summary (Last 24 hours) at 09/09/2023 0700 Last data filed at 09/09/2023 1610 Gross per 24 hour  Intake 754.29 ml  Output 3500 ml  Net -2745.71 ml     Physical Exam  General:  Fatigued appearing. Sitting up in chair. HEENT: normal Neck: supple.  JVP to jaw. Carotids 2+ bilat; no bruits.  Cor: PMI nondisplaced. Regular rate & rhythm, tachy. No rubs, gallops or murmurs. Lungs: clear Abdomen: soft, nontender, nondistended. RUE PICC Extremities: no cyanosis, clubbing, rash, 2+ edema, + UNNA Neuro: alert & orientedx3. Affect sad.   Telemetry   ST 120s, occasional PVCs  EKG    Villa/a  Labs    CBC Recent Labs    09/08/23 0530 09/09/23 0405  WBC 7.3 7.8  HGB 11.0* 11.3*  HCT 38.2* 38.9*  MCV 64.0* 63.7*  PLT 269 289   Basic Metabolic Panel Recent Labs    96/04/54 1126 09/08/23 0530 09/08/23 1823 09/09/23 0405  NA  --    < > 129* 133*  K  --    < > 3.8 3.4*  CL  --    < > 99 102  CO2  --    < > 22 23   GLUCOSE  --    < > 436* 347*  BUN  --    < > 73* 69*  CREATININE  --    < > 2.11* 2.04*  CALCIUM  --    < > 8.3* 8.6*  MG 2.1  --   --   --    < > = values in this interval not displayed.   Liver Function Tests Recent Labs    09/08/23 0930 09/08/23 1823  AST 24 22  ALT 24 24  ALKPHOS 77 73  BILITOT 0.9 0.9  PROT 6.3* 6.1*  ALBUMIN 2.1* 2.0*   No results for input(s): "LIPASE", "AMYLASE" in the last 72 hours. Cardiac Enzymes No results for input(s): "CKTOTAL", "CKMB", "CKMBINDEX", "TROPONINI" in the last 72 hours.  BNP: BNP (last 3 results) Recent Labs    08/18/23 1622 09/05/23 1124  BNP 4,064.5* 3,881.1*    ProBNP (last 3 results) No results for input(s): "PROBNP" in the last 8760 hours.   D-Dimer No results for input(s): "DDIMER" in the last 72 hours. Hemoglobin A1C No results for input(s): "HGBA1C" in the last 72 hours.  Fasting Lipid Panel Recent Labs    09/06/23 1505  CHOL 117  HDL 22*  LDLCALC 73  TRIG 161  CHOLHDL 5.3   Thyroid Function Tests Recent Labs    09/08/23 1046  TSH 5.647*    Other results:   Imaging   No results found.  Medications:     Scheduled Medications:  amiodarone  400 mg Oral BID   atorvastatin  40 mg Oral Daily   busPIRone  15 mg Oral BID   Chlorhexidine Gluconate Cloth  6 each Topical Daily   enoxaparin (LOVENOX) injection  40 mg Subcutaneous Q24H   insulin aspart  0-20 Units Subcutaneous TID WC   insulin aspart  0-5 Units Subcutaneous QHS   insulin aspart  3 Units Subcutaneous TID WC   mupirocin cream   Topical Daily   potassium chloride  40 mEq Oral BID   sodium chloride flush  10 mL Intravenous Q12H   sodium chloride flush  10-40 mL Intracatheter Q12H   sodium chloride flush  3 mL Intravenous Q12H    Infusions:  sodium chloride     furosemide (LASIX) 200 mg in dextrose 5 % 100 mL (2 mg/mL) infusion 20 mg/hr (09/09/23 0607)   milrinone 0.375 mcg/kg/min (09/09/23 0600)   norepinephrine (LEVOPHED)  Adult infusion 2 mcg/min (09/09/23 0600)    PRN Medications: acetaminophen, ondansetron (ZOFRAN) IV, mouth rinse, sodium chloride flush, sodium chloride flush  Patient Profile   Jeffery Villa is a 37 y.o. male with past medical history of hypertension, hyperlipidemia, insulin-dependent diabetes mellitus since 37 years of age, CKD stage IIIb, OSA and chronic systolic heart failure.  Admitted with acute on chronic systolic CHF with low-output.    Assessment/Plan   1. Acute on Chronic Biventricular Heart Failure:  - Cardiomyopathy of uncertain etiology.  Echo in 9/22 with EF 20-25%, moderate-severe MR, moderate-severe TR.  Echo 05/23 EF 20-25%, global hypokinesis with moderate LV dilation, mod-severe MR (severe by PISA ERO), moderate TR.  He has had HTN since his teens, could be due to long-standing poorly controlled HTN. With DM/HTN/hyperlipidemia x years, CAD must be a consideration though no chest pain. Prior viral myocarditis is possible as well. Additionally, his mother died of CHF in her early 20s.  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  cMRI limited study, difficult images due to problems with breath-holding. >50% wall thickness subendocardial LGE in the mid anteroseptal wall, looks like a coronary disease pattern suggestive of prior MI though does not seem extensive enough to cause his degree of LV dysfunction. LVEF 15% on cMRI.  Echo in 8/23 showed EF 20% with severe LV dilation, moderate RV dilation with mildly decreased systolic function, mod-severe TR, mod-severe functional MR, PASP 64 mmHg, dilated IVC.  Narrow QRS, not CRT candidate. Missed appointment with EP to discuss ICD. CPX 09/23: severe functional limitation d/t advanced HF and obesity, peak VO2 15, VE/VCO2 slope 45. - Now admitted with low-output heart failure. Lactic acid not elevated.  - Echo this admit: EF < 20%, RV okay, RVSP 68 mmHg, moderate MR - CO-OX  72% on 0.375 milrinone + 2 NE - NYHA IV. Markedly volume overloaded. Better diuresis overnight with lasix gtt at 20/hr.  - Going for RHC today to definitively assess hemodynamics and degree of volume overload. May require mechanical support to optimize him for transplant. - GDMT limited by CKD Stage IIIb and low output.  - No MRA/ARNI  - Apply UNNA boots - Concerned he will need dual organ transplant. Workup initiated. Dr. Leory Plowman  discussed with [facility]. Needs further optimization and better control of diabetes (A1c 10).  -2. 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  - He will need to establish with Nephrology.  - Looks like creatinine baseline is ~ 2.4-2.6. - Scr 2.04 today - follow BMET - Renal US with evidence of medical renal disease.   3. Type 2 diabetes:  - Early onset.  - A1c 10.0 - SSI.  - Diabetes coordinator consulted.  4. HTN:  - Early onset in teens.  - GDMT per above   5. Hyperlipidemia:  - Continue atorvastatin.   6. Diabetic foot s/p toe amputation.   7. Mitral regurgitation:  - Echo this admit with moderate MR.  8. OSA: - Needs CPAP, has not been able to tolerate. He has a new mask but hasn't tried it yet.   9. Iron deficiency anemia: - T sat 6% and Ferritin 21.  - May eventually benefit from IV iron.  10. NSVT - Multiple runs on telemetry this admit, no recurrence after adding PO amiodarone - Supp K with diuresis   Will try to arrange for him to visit with his family later today.  Length of Stay: 4  Jeffery Villa, Jeffery N, PA-C  09/09/2023, 7:00 AM  Advanced Heart Failure Team Pager [phone] (M-F; 7a - 5p)  Please contact [facility] for night-coverage after hours (5p -7a ) and weekends on [email or web address]   Agree with above.   Moved to ICU overnight for low co-ox despite milrinone 0.375. Coox now improved with NE 2   Diuresing well with lasix gtt. Breathing better. Nervous about cath.   General:   Lying in bed HEENT: normal Neck: supple. JVP 10 a Cor: Reg tachy + s3 Lungs: clear Abdomen: soft, nontender, nondistended. No hepatosplenomegaly. No bruits or masses. Good bowel sounds. Extremities: no cyanosis, clubbing, rash, 1+ edema Neuro: alert & orientedx3, cranial nerves grossly intact. moves all 4 extremities w/o difficulty. Affect pleasant  Improved with addition of milrinone. Still very tenuous   Plan RHC and coronary angio (minimal contrast) today to further assess.   Ideally would transfer to [facility] as inpatient for transplant eval but diabetes control is a barrier currently (A1c 10).   Further decision making based on cath.   CRITICAL CARE Performed by: Arvilla Meres  Total critical care time: 50 minutes  Critical care time was exclusive of separately billable procedures and treating other patients.  Critical care was necessary to treat or prevent imminent or life-threatening deterioration.  Critical care was time spent personally by me (independent of midlevel providers or residents) on the following activities: development of treatment plan with patient and/or surrogate as well as nursing, discussions with consultants, evaluation of patient's response to treatment, examination of patient, obtaining history from patient or surrogate, ordering and performing treatments and interventions, ordering and review of laboratory studies, ordering and review of radiographic studies, pulse oximetry and re-evaluation of patient's condition.   Arvilla Meres, MD  7:58 AM

## 2023-09-09 NOTE — Interval H&P Note (Signed)
History and Physical Interval Note:  09/09/2023 7:47 AM  Jeffery Villa  has presented today for surgery, with the diagnosis of hf.  The various methods of treatment have been discussed with the patient and family. After consideration of risks, benefits and other options for treatment, the patient has consented to  Procedure(s): RIGHT/LEFT HEART CATH AND CORONARY ANGIOGRAPHY (N/A) as a surgical intervention.  The patient's history has been reviewed, patient examined, no change in status, stable for surgery.  I have reviewed the patient's chart and labs.  Questions were answered to the patient's satisfaction.     Alek Poncedeleon

## 2023-09-09 NOTE — Inpatient Diabetes Management (Signed)
Inpatient Diabetes Program Recommendations  AACE/ADA: New Consensus Statement on Inpatient Glycemic Control (2015)  Target Ranges:  Prepandial:   less than 140 mg/dL      Peak postprandial:   less than 180 mg/dL (1-2 hours)      Critically ill patients:  140 - 180 mg/dL   Lab Results  Component Value Date   GLUCAP 219 (H) 09/09/2023   HGBA1C 10.0 (H) 09/05/2023    Review of Glycemic Control  Latest Reference Range & Units 09/09/23 11:54  Glucose-Capillary 70 - 99 mg/dL 119 (H)  (H): Data is abnormally high Diabetes history: DM2 Outpatient Diabetes medications: Glipizide 5 mg BID Current orders for Inpatient glycemic control: Novolog 0-20 units TID and 0-5 units QHS Novolog 3 units TID  Inpatient Diabetes Program Recommendations:     Consider reducing correction given renal status to Novolog 0-9 units TID  Secure chat sent to PA.   Thanks, Lujean Rave, MSN, RNC-OB Diabetes Coordinator [phone] (8a-5p)

## 2023-09-09 NOTE — TOC Progression Note (Signed)
Transition of Care [facility]) - Progression Note    Patient Details  Name: Jeffery Villa MRN: [id number] Date of Birth: 1986-05-30  Transition of Care [facility]) CM/SW Contact  Nicanor Bake Phone Number: [phone] 09/09/2023, 3:26 PM  Clinical Narrative:   HF CSW met with pt at bedside. Pt had a friend visiting. Pt stated that he felt comfortable speaking in front of his friend. Pt appeared to be in positive spirits. Pt stated that he was concerned about finances and inquired about medicaid and disability. CSW explained that she will reach out to financial counselors and follow up.   TOC will continue following.     Expected Discharge Plan: Home w Home Health Services Barriers to Discharge: Continued Medical Work up  Expected Discharge Plan and Services   Discharge Planning Services: CM Consult Post Acute Care Choice: Home Health                             HH Arranged: RN [facility] Agency: [facility] Date HH Agency Contacted: 09/07/23 Time HH Agency Contacted: 1139 Representative spoke with at [facility] Agency: Jeri Modena RN, [facility]   Social Determinants of Health (SDOH) Interventions SDOH Screenings   Food Insecurity: No Food Insecurity (09/05/2023)  Housing: Patient Unable To Answer (09/07/2023)  Transportation Needs: No Transportation Needs (09/05/2023)  Utilities: Not At Risk (09/05/2023)  Alcohol Screen: Low Risk  (09/07/2023)  Financial Resource Strain: Low Risk  (07/14/2023)   Received from [facility]  Physical Activity: Sufficiently Active (10/21/2020)   Received from [facility], [facility]  Social Connections: Unknown (03/04/2022)   Received from [facility], [facility]  Stress: Stress Concern Present (10/21/2020)   Received from [facility], [facility]  Tobacco Use: Unknown (09/05/2023)    Readmission Risk Interventions     No data to display

## 2023-09-10 ENCOUNTER — Inpatient Hospital Stay (HOSPITAL_COMMUNITY): Payer: BC Managed Care – PPO

## 2023-09-10 DIAGNOSIS — I5023 Acute on chronic systolic (congestive) heart failure: Secondary | ICD-10-CM | POA: Diagnosis not present

## 2023-09-10 LAB — CBC
HCT: 37.4 % — ABNORMAL LOW (ref 39.0–52.0)
Hemoglobin: 10.7 g/dL — ABNORMAL LOW (ref 13.0–17.0)
MCH: 18.2 pg — ABNORMAL LOW (ref 26.0–34.0)
MCHC: 28.6 g/dL — ABNORMAL LOW (ref 30.0–36.0)
MCV: 63.5 fL — ABNORMAL LOW (ref 80.0–100.0)
Platelets: 217 10*3/uL (ref 150–400)
RBC: 5.89 MIL/uL — ABNORMAL HIGH (ref 4.22–5.81)
RDW: 22.5 % — ABNORMAL HIGH (ref 11.5–15.5)
WBC: 6.5 10*3/uL (ref 4.0–10.5)
nRBC: 0 % (ref 0.0–0.2)

## 2023-09-10 LAB — BASIC METABOLIC PANEL
Anion gap: 8 (ref 5–15)
BUN: 70 mg/dL — ABNORMAL HIGH (ref 6–20)
CO2: 23 mmol/L (ref 22–32)
Calcium: 8.5 mg/dL — ABNORMAL LOW (ref 8.9–10.3)
Chloride: 103 mmol/L (ref 98–111)
Creatinine, Ser: 2.23 mg/dL — ABNORMAL HIGH (ref 0.61–1.24)
GFR, Estimated: 38 mL/min — ABNORMAL LOW (ref >60)
Glucose, Bld: 297 mg/dL — ABNORMAL HIGH (ref 70–99)
Potassium: 4 mmol/L (ref 3.5–5.1)
Sodium: 134 mmol/L — ABNORMAL LOW (ref 135–145)

## 2023-09-10 LAB — COOXEMETRY PANEL
Carboxyhemoglobin: 1.9 % — ABNORMAL HIGH (ref 0.5–1.5)
Carboxyhemoglobin: 1.2 % (ref 0.5–1.5)
Methemoglobin: 0.7 % (ref 0.0–1.5)
Methemoglobin: <0.7 % (ref 0.0–1.5)
O2 Saturation: 84.7 %
O2 Saturation: 52.2 %
Total hemoglobin: 11.5 g/dL — ABNORMAL LOW (ref 12.0–16.0)
Total hemoglobin: 12.2 g/dL (ref 12.0–16.0)

## 2023-09-10 LAB — GLUCOSE, CAPILLARY
Glucose-Capillary: 211 mg/dL — ABNORMAL HIGH (ref 70–99)
Glucose-Capillary: 183 mg/dL — ABNORMAL HIGH (ref 70–99)
Glucose-Capillary: 178 mg/dL — ABNORMAL HIGH (ref 70–99)
Glucose-Capillary: 179 mg/dL — ABNORMAL HIGH (ref 70–99)

## 2023-09-10 LAB — MAGNESIUM: Magnesium: 1.8 mg/dL (ref 1.7–2.4)

## 2023-09-10 MED ORDER — POTASSIUM CHLORIDE CRYS ER 20 MEQ PO TBCR
40.0000 meq | EXTENDED_RELEASE_TABLET | Freq: Once | ORAL | Status: AC
  Administered 2023-09-10: 40 meq via ORAL
  Filled 2023-09-10: qty 2

## 2023-09-10 MED ORDER — MAGNESIUM SULFATE 2 GM/50ML IV SOLN
2.0000 g | Freq: Once | INTRAVENOUS | Status: AC
  Administered 2023-09-10: 2 g via INTRAVENOUS
  Filled 2023-09-10: qty 50

## 2023-09-10 NOTE — Plan of Care (Signed)

## 2023-09-10 NOTE — Progress Notes (Signed)
Advanced Heart Failure Rounding Note  PCP-Cardiologist: None   Subjective:    RHC 11/15 On milrinone 0.375 and NE 3   Ao = 107/79 (92) LV = 111/27 RA = 10 RV = 53/10 PA =  46/30 (37) PCW = 20 Fick cardiac output/index = 7.3/3.1 Thermo CP/CI = 5.5/2.3 PVR = 2.3 (Fick) 3.1 (Thermo) Ao sat = 95% PA sat = 66%, 68% PAPi = 1.6  Remains on NE 2 Milrinone 0.375 and lasix gtt  at 20.  Diuresed 2.4 L  Co-ox 85% (?). Weight up 1 pound  Swan pulled back last night. Now nonfunctional .  Denies CP or SOB. Remains tachy with HRs in 120s (sinus)  Objective:   Weight Range: 109.6 kg Body mass index is 31.02 kg/m.   Vital Signs:   Temp:  [97.2 F (36.2 C)-98.6 F (37 C)] 97.8 F (36.6 C) (11/16 1200) Pulse Rate:  [106-128] 118 (11/16 1200) Resp:  [10-27] 25 (11/16 1200) BP: (109-130)/(85-103) 115/97 (11/16 1200) SpO2:  [84 %-100 %] 100 % (11/16 1200) Weight:  [109.6 kg] 109.6 kg (11/16 0500) Last BM Date : 09/09/23  Weight change: Filed Weights   09/08/23 2300 09/09/23 0500 09/10/23 0500  Weight: 111 kg 109.2 kg 109.6 kg    Intake/Output:   Intake/Output Summary (Last 24 hours) at 09/10/2023 1421 Last data filed at 09/10/2023 1211 Gross per 24 hour  Intake 1499.75 ml  Output 2105 ml  Net -605.25 ml     Physical Exam   General:  Sitting up in chair No resp difficulty HEENT: normal Neck: supple. RIJ swan Carotids 2+ bilat; no bruits. No lymphadenopathy or thryomegaly appreciated. Cor: Regular tachy + s3 Lungs: clear Abdomen: soft, nontender, nondistended. No hepatosplenomegaly. No bruits or masses. Good bowel sounds. Extremities: no cyanosis, clubbing, rash, 1+ edema Neuro: alert & orientedx3, cranial nerves grossly intact. moves all 4 extremities w/o difficulty. Affect pleasant   Telemetry   ST 120s + PVCs Personally reviewed  Labs    CBC Recent Labs    09/09/23 0405 09/09/23 0812 09/09/23 0818 09/10/23 0528  WBC 7.8  --   --  6.5  HGB 11.3*    < > 14.3  13.9 10.7*  HCT 38.9*   < > 42.0  41.0 37.4*  MCV 63.7*  --   --  63.5*  PLT 289  --   --  217   < > = values in this interval not displayed.   Basic Metabolic Panel Recent Labs    16/10/96 0405 09/09/23 0812 09/09/23 0818 09/10/23 0528  NA 133*   < > 141  141 134*  K 3.4*   < > 3.6  3.5 4.0  CL 102  --   --  103  CO2 23  --   --  23  GLUCOSE 347*  --   --  297*  BUN 69*  --   --  70*  CREATININE 2.04*  --   --  2.23*  CALCIUM 8.6*  --   --  8.5*  MG  --   --   --  1.8   < > = values in this interval not displayed.   Liver Function Tests Recent Labs    09/08/23 0930 09/08/23 1823  AST 24 22  ALT 24 24  ALKPHOS 77 73  BILITOT 0.9 0.9  PROT 6.3* 6.1*  ALBUMIN 2.1* 2.0*   No results for input(s): "LIPASE", "AMYLASE" in the last 72 hours. Cardiac Enzymes No results  for input(s): "CKTOTAL", "CKMB", "CKMBINDEX", "TROPONINI" in the last 72 hours.  BNP: BNP (last 3 results) Recent Labs    08/18/23 1622 09/05/23 1124  BNP 4,064.5* 3,881.1*    ProBNP (last 3 results) No results for input(s): "PROBNP" in the last 8760 hours.   D-Dimer No results for input(s): "DDIMER" in the last 72 hours. Hemoglobin A1C No results for input(s): "HGBA1C" in the last 72 hours.  Fasting Lipid Panel No results for input(s): "CHOL", "HDL", "LDLCALC", "TRIG", "CHOLHDL", "LDLDIRECT" in the last 72 hours.  Thyroid Function Tests Recent Labs    09/08/23 1046  TSH 5.647*    Other results:   Imaging   DG CHEST PORT 1 VIEW  Result Date: 09/10/2023 CLINICAL DATA:  Status post cardiac catheterization EXAM: PORTABLE CHEST 1 VIEW COMPARISON:  09/06/2023 FINDINGS: Left PICC line remains in place with the tip at the cavoatrial junction. Interval placement of right internal jugular central line with the tip at the cavoatrial junction. No pneumothorax. Cardiomegaly. No confluent opacities, effusions or pneumothorax. No acute bony abnormality. IMPRESSION: Cardiomegaly. No  acute cardiopulmonary disease. Electronically Signed   By: Charlett Nose M.D.   On: 09/10/2023 02:55    Medications:     Scheduled Medications:  amiodarone  400 mg Oral BID   atorvastatin  40 mg Oral Daily   busPIRone  15 mg Oral BID   Chlorhexidine Gluconate Cloth  6 each Topical Daily   enoxaparin (LOVENOX) injection  40 mg Subcutaneous Q24H   insulin aspart  0-20 Units Subcutaneous TID WC   insulin aspart  0-5 Units Subcutaneous QHS   insulin aspart  3 Units Subcutaneous TID WC   lidocaine  1 patch Transdermal Q24H   mupirocin cream   Topical Daily   sodium chloride flush  10-40 mL Intracatheter Q12H   sodium chloride flush  3 mL Intravenous Q12H   sodium chloride flush  3 mL Intravenous Q12H    Infusions:  furosemide (LASIX) 200 mg in dextrose 5 % 100 mL (2 mg/mL) infusion 20 mg/hr (09/10/23 1200)   magnesium sulfate bolus IVPB     milrinone 0.375 mcg/kg/min (09/10/23 1200)   norepinephrine (LEVOPHED) Adult infusion 2 mcg/min (09/10/23 1200)    PRN Medications: acetaminophen, ondansetron (ZOFRAN) IV, mouth rinse, sodium chloride flush, sodium chloride flush, sodium chloride flush  Patient Profile   Jeffery Villa is a 37 y.o. male with past medical history of hypertension, hyperlipidemia, insulin-dependent diabetes mellitus since 37 years of age, CKD stage IIIb, OSA and chronic systolic heart failure.  Admitted with acute on chronic systolic CHF with low-output.    Assessment/Plan   1. Acute on Chronic Biventricular Heart Failure:  - Cardiomyopathy of uncertain etiology.  Echo in 9/22 with EF 20-25%, moderate-severe MR, moderate-severe TR.  Echo 05/23 EF 20-25%, global hypokinesis with moderate LV dilation, mod-severe MR (severe by PISA ERO), moderate TR.  He has had HTN since his teens, could be due to long-standing poorly controlled HTN. With DM/HTN/hyperlipidemia x years, CAD must be a consideration though no chest pain. Prior viral myocarditis is possible as well.  Additionally, his mother died of CHF in her early 38s.  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  cMRI limited study, difficult images due to problems with breath-holding. >50% wall thickness subendocardial LGE in the mid anteroseptal wall, looks like a coronary disease pattern suggestive of prior MI though does not seem extensive enough to cause his degree of LV dysfunction. LVEF  15% on cMRI.  Echo in 8/23 showed EF 20% with severe LV dilation, moderate RV dilation with mildly decreased systolic function, mod-severe TR, mod-severe functional MR, PASP 64 mmHg, dilated IVC.  Narrow QRS, not CRT candidate. Missed appointment with EP to discuss ICD. CPX 09/23: severe functional limitation d/t advanced HF and obesity, peak VO2 15, VE/VCO2 slope 45. - Now admitted with low-output heart failure. Lactic acid not elevated.  - Echo this admit: EF < 20%, RV okay, RVSP 68 mmHg, moderate MR - RHC 11/15 with mild volume overload and preserved CO on NE 3 milrinone 0.25 - CO-OX 8% (?) on 0.375 milrinone + 2 NE - NYHA IV with prominent s3 and marked sinus tach - GDMT limited by CKD Stage IIIb and low output.  - No MRA/ARNI  - Apply UNNA boots - He has end-stage biventricular HF. Ideally he will need dual organ transplant. Workup initiated. Case discussed with [facility]. Needs further optimization and better control of diabetes (A1c 10). May need to consider bridge VAD. I suspect we will likely need to send him to [facility] as inpatient for transplant eval as we may not be abe to get him stable enough for outpatient eval.   2. AKI on CKD stage 3b:  - [facility] to DM2/HTN - Creatinine baseline is ~ 2.4-2.6. - Scr 2.04 -> 2.23 today - follow BMET - Renal US with evidence of medical renal disease.   3. Type 2 diabetes:  - Early onset.  - A1c 10.0 - SSI.  - Diabetes coordinator consulted.  4. HTN:  - Early onset in teens.  - GDMT per above   5.  Hyperlipidemia:  - Continue atorvastatin.   6. Diabetic foot s/p toe amputation.   7. Mitral regurgitation:  - Echo this admit with moderate MR.  8. OSA: - Needs CPAP, has not been able to tolerate. He has a new mask but hasn't tried it yet.   9. Iron deficiency anemia: - T sat 6% and Ferritin 21.  - May eventually benefit from IV iron.  10. NSVT - Multiple runs on telemetry this admit, no recurrence after adding PO amiodarone - Supp K with diuresis  CRITICAL CARE Performed by: Arvilla Meres  Total critical care time: 45 minutes  Critical care time was exclusive of separately billable procedures and treating other patients.  Critical care was necessary to treat or prevent imminent or life-threatening deterioration.  Critical care was time spent personally by me (independent of midlevel providers or residents) on the following activities: development of treatment plan with patient and/or surrogate as well as nursing, discussions with consultants, evaluation of patient's response to treatment, examination of patient, obtaining history from patient or surrogate, ordering and performing treatments and interventions, ordering and review of laboratory studies, ordering and review of radiographic studies, pulse oximetry and re-evaluation of patient's condition.   Length of Stay: 5  Arvilla Meres, MD  09/10/2023, 2:21 PM  Advanced Heart Failure Team Pager [phone] (M-F; 7a - 5p)  Please contact [facility] for night-coverage after hours (5p -7a ) and weekends on [email or web address]

## 2023-09-10 NOTE — Progress Notes (Signed)
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  RIJ sheath with NS infusing at 49ml/hr.  Upon assessment, SGC remains dislodged and fluid noted in catheter sleeve and leaking on pt.

## 2023-09-11 DIAGNOSIS — I5023 Acute on chronic systolic (congestive) heart failure: Secondary | ICD-10-CM | POA: Diagnosis not present

## 2023-09-11 LAB — BASIC METABOLIC PANEL
Anion gap: 7 (ref 5–15)
BUN: 72 mg/dL — ABNORMAL HIGH (ref 6–20)
CO2: 23 mmol/L (ref 22–32)
Calcium: 8.2 mg/dL — ABNORMAL LOW (ref 8.9–10.3)
Chloride: 106 mmol/L (ref 98–111)
Creatinine, Ser: 2.52 mg/dL — ABNORMAL HIGH (ref 0.61–1.24)
GFR, Estimated: 33 mL/min — ABNORMAL LOW (ref >60)
Glucose, Bld: 223 mg/dL — ABNORMAL HIGH (ref 70–99)
Potassium: 3.8 mmol/L (ref 3.5–5.1)
Sodium: 136 mmol/L (ref 135–145)

## 2023-09-11 LAB — CBC
HCT: 37.1 % — ABNORMAL LOW (ref 39.0–52.0)
Hemoglobin: 10.8 g/dL — ABNORMAL LOW (ref 13.0–17.0)
MCH: 18.4 pg — ABNORMAL LOW (ref 26.0–34.0)
MCHC: 29.1 g/dL — ABNORMAL LOW (ref 30.0–36.0)
MCV: 63.2 fL — ABNORMAL LOW (ref 80.0–100.0)
Platelets: 263 10*3/uL (ref 150–400)
RBC: 5.87 MIL/uL — ABNORMAL HIGH (ref 4.22–5.81)
RDW: 22.3 % — ABNORMAL HIGH (ref 11.5–15.5)
WBC: 6.8 10*3/uL (ref 4.0–10.5)
nRBC: 0 % (ref 0.0–0.2)

## 2023-09-11 LAB — GLUCOSE, CAPILLARY
Glucose-Capillary: 226 mg/dL — ABNORMAL HIGH (ref 70–99)
Glucose-Capillary: 121 mg/dL — ABNORMAL HIGH (ref 70–99)
Glucose-Capillary: 155 mg/dL — ABNORMAL HIGH (ref 70–99)
Glucose-Capillary: 200 mg/dL — ABNORMAL HIGH (ref 70–99)

## 2023-09-11 LAB — HEPARIN LEVEL (UNFRACTIONATED): Heparin Unfractionated: 0.11 [IU]/mL — ABNORMAL LOW (ref 0.30–0.70)

## 2023-09-11 LAB — MAGNESIUM: Magnesium: 2.1 mg/dL (ref 1.7–2.4)

## 2023-09-11 LAB — COOXEMETRY PANEL
Carboxyhemoglobin: 2.1 % — ABNORMAL HIGH (ref 0.5–1.5)
Methemoglobin: <0.7 % (ref 0.0–1.5)
O2 Saturation: 78.8 %
Total hemoglobin: 11.1 g/dL — ABNORMAL LOW (ref 12.0–16.0)

## 2023-09-11 MED ORDER — POTASSIUM CHLORIDE CRYS ER 20 MEQ PO TBCR
60.0000 meq | EXTENDED_RELEASE_TABLET | Freq: Once | ORAL | Status: AC
  Administered 2023-09-11: 60 meq via ORAL
  Filled 2023-09-11: qty 3

## 2023-09-11 MED ORDER — INSULIN ASPART 100 UNIT/ML IJ SOLN
0.0000 [IU] | Freq: Three times a day (TID) | INTRAMUSCULAR | Status: DC
  Administered 2023-09-11 – 2023-09-12 (×3): 3 [IU] via SUBCUTANEOUS
  Administered 2023-09-12: 2 [IU] via SUBCUTANEOUS

## 2023-09-11 MED ORDER — METOLAZONE 2.5 MG PO TABS
2.5000 mg | ORAL_TABLET | Freq: Once | ORAL | Status: AC
  Administered 2023-09-11: 2.5 mg via ORAL
  Filled 2023-09-11: qty 1

## 2023-09-11 MED ORDER — INSULIN GLARGINE-YFGN 100 UNIT/ML ~~LOC~~ SOLN
8.0000 [IU] | Freq: Every day | SUBCUTANEOUS | Status: DC
  Administered 2023-09-11 – 2023-09-12 (×2): 8 [IU] via SUBCUTANEOUS
  Filled 2023-09-11 (×2): qty 0.08

## 2023-09-11 NOTE — Progress Notes (Signed)
Advanced Heart Failure Rounding Note  PCP-Cardiologist: None   Subjective:    RHC 11/15 On milrinone 0.375 and NE 3   Ao = 107/79 (92) LV = 111/27 RA = 10 RV = 53/10 PA =  46/30 (37) PCW = 20 Fick cardiac output/index = 7.3/3.1 Thermo CP/CI = 5.5/2.3 PVR = 2.3 (Fick) 3.1 (Thermo) Ao sat = 95% PA sat = 66%, 68% PAPi = 1.6  Remains on NE 2 Milrinone 0.375 and lasix gtt  at 25.  Diuresed 1.8 L  Co-ox 79%. Weight up 1 pound  Swan dislodged so had to be removed  CVP via PIC 16-17  Denies CP or SOB. Remains tachy with HRs in 120s (sinus). Abe to ambulate room  Objective:   Weight Range: 110.2 kg Body mass index is 31.19 kg/m.   Vital Signs:   Temp:  [97.5 F (36.4 C)-98.6 F (37 C)] 97.9 F (36.6 C) (11/17 1200) Pulse Rate:  [105-122] 122 (11/17 0847) Resp:  [11-27] 11 (11/17 1013) BP: (101-129)/(60-102) 106/76 (11/17 0800) SpO2:  [93 %-100 %] 99 % (11/17 0847) Weight:  [110.2 kg] 110.2 kg (11/17 0451) Last BM Date : 09/11/23  Weight change: Filed Weights   09/09/23 0500 09/10/23 0500 09/11/23 0451  Weight: 109.2 kg 109.6 kg 110.2 kg    Intake/Output:   Intake/Output Summary (Last 24 hours) at 09/11/2023 1258 Last data filed at 09/11/2023 1000 Gross per 24 hour  Intake 1062.28 ml  Output 2200 ml  Net -1137.72 ml     Physical Exam   General:  Sitting up in chair No resp difficulty HEENT: normal Neck: supple. JVP to ear .  Cor: PMI laterally displaced. Regular tachy + s3 Lungs: clear Abdomen: soft, nontender, nondistended. No hepatosplenomegaly. No bruits or masses. Good bowel sounds. Extremities: no cyanosis, clubbing, rash, 2+ thigh edema Neuro: alert & orientedx3, cranial nerves grossly intact. moves all 4 extremities w/o difficulty. Affect pleasant   Telemetry   ST 120s + PVCs Personally reviewed  Labs    CBC Recent Labs    09/10/23 0528 09/11/23 0417  WBC 6.5 6.8  HGB 10.7* 10.8*  HCT 37.4* 37.1*  MCV 63.5* 63.2*  PLT 217 263    Basic Metabolic Panel Recent Labs    08/65/78 0528 09/11/23 0417  NA 134* 136  K 4.0 3.8  CL 103 106  CO2 23 23  GLUCOSE 297* 223*  BUN 70* 72*  CREATININE 2.23* 2.52*  CALCIUM 8.5* 8.2*  MG 1.8 2.1   Liver Function Tests Recent Labs    09/08/23 1823  AST 22  ALT 24  ALKPHOS 73  BILITOT 0.9  PROT 6.1*  ALBUMIN 2.0*   No results for input(s): "LIPASE", "AMYLASE" in the last 72 hours. Cardiac Enzymes No results for input(s): "CKTOTAL", "CKMB", "CKMBINDEX", "TROPONINI" in the last 72 hours.  BNP: BNP (last 3 results) Recent Labs    08/18/23 1622 09/05/23 1124  BNP 4,064.5* 3,881.1*    ProBNP (last 3 results) No results for input(s): "PROBNP" in the last 8760 hours.   D-Dimer No results for input(s): "DDIMER" in the last 72 hours. Hemoglobin A1C No results for input(s): "HGBA1C" in the last 72 hours.  Fasting Lipid Panel No results for input(s): "CHOL", "HDL", "LDLCALC", "TRIG", "CHOLHDL", "LDLDIRECT" in the last 72 hours.  Thyroid Function Tests No results for input(s): "TSH", "T4TOTAL", "T3FREE", "THYROIDAB" in the last 72 hours.  Invalid input(s): "FREET3"   Other results:   Imaging   No results found.  Medications:     Scheduled Medications:  amiodarone  400 mg Oral BID   atorvastatin  40 mg Oral Daily   busPIRone  15 mg Oral BID   Chlorhexidine Gluconate Cloth  6 each Topical Daily   enoxaparin (LOVENOX) injection  40 mg Subcutaneous Q24H   insulin aspart  0-20 Units Subcutaneous TID WC   insulin aspart  0-5 Units Subcutaneous QHS   insulin aspart  3 Units Subcutaneous TID WC   lidocaine  1 patch Transdermal Q24H   mupirocin cream   Topical Daily   sodium chloride flush  10-40 mL Intracatheter Q12H   sodium chloride flush  3 mL Intravenous Q12H   sodium chloride flush  3 mL Intravenous Q12H    Infusions:  furosemide (LASIX) 200 mg in dextrose 5 % 100 mL (2 mg/mL) infusion 25 mg/hr (09/11/23 1257)   milrinone 0.5 mcg/kg/min  (09/11/23 1050)   norepinephrine (LEVOPHED) Adult infusion 2 mcg/min (09/11/23 1000)    PRN Medications: acetaminophen, ondansetron (ZOFRAN) IV, mouth rinse, sodium chloride flush, sodium chloride flush, sodium chloride flush  Patient Profile   Mr. Degenhart is a 37 y.o. male with past medical history of hypertension, hyperlipidemia, insulin-dependent diabetes mellitus since 37 years of age, CKD stage IIIb, OSA and chronic systolic heart failure.  Admitted with acute on chronic systolic CHF with low-output.    Assessment/Plan   1. Acute on Chronic Biventricular Heart Failure:  - Mixed ischemic/NICM cardiomyopathy of uncertain etiology.  Echo in 9/22 with EF 20-25%, moderate-severe MR, moderate-severe TR.  - He has had HTN since his teens, could be due to long-standing poorly controlled HTN. With DM/HTN/hyperlipidemia x years,  - cMRI limited study, difficult images due to problems with breath-holding. >50% wall thickness subendocardial LGE in the mid anteroseptal wall, looks like a coronary disease pattern suggestive of prior MI though does not seem extensive enough to cause his degree of LV dysfunction. LVEF 15% on cMRI.   - CPX 09/23: severe functional limitation d/t advanced HF and obesity, peak VO2 15, VE/VCO2 slope 45. - Now admitted with low-output heart failure. Lactic acid not elevated.  - Echo this admit: EF < 20%, RV okay, RVSP 68 mmHg, moderate MR - RHC 11/15 with mild volume overload and preserved CO on NE 3 milrinone 0.25 - Cath this admit with occluded mid LAD with R-L collats otherwise minimal CAD - CO-OX 79% (?) on 0.375 milrinone + 2 NE CVP 16 - Continue lasix gtt at 25. Give metolazone x 1 today - NYHA IV with prominent s3 and marked sinus tach - GDMT limited by CKD Stage IIIb and low output.  - No MRA/ARNI  - He has end-stage biventricular HF. Ideally he will need dual organ transplant. Workup initiated. Case discussed with [facility]. Needs further optimization and better  control of diabetes (A1c 10). May need to consider bridge VAD but renal function an issue.  - I do not see any way we can get him home safely to go to [facility] for outpatient transplant evaluation Will reach back out to discuss inpatient transfer.  2. AKI on CKD stage 3b:  - [facility] to DM2/HTN - Creatinine baseline is ~ 2.4-2.6. - Scr 2.04 -> 2.23 -> 2.52 today - follow BMET - Renal US with evidence of medical renal disease.   3. Type 2 diabetes:  - Early onset.  - A1c 10.0 - SSI.  - Diabetes coordinator consulted.  4. HTN:  - Early onset in teens.  - GDMT per above  5. Hyperlipidemia:  - Continue atorvastatin.   6. Diabetic foot s/p toe amputation.   7. Mitral regurgitation:  - Echo this admit with moderate MR.  8. OSA: - Needs CPAP, has not been able to tolerate. He has a new mask but hasn't tried it yet.   9. Iron deficiency anemia: - T sat 6% and Ferritin 21.  - May eventually benefit from IV iron.  10. NSVT - Multiple runs on telemetry this admit, no recurrence after adding PO amiodarone - K 3.8 Supp K with diuresis - Mg 21  CRITICAL CARE Performed by: Arvilla Meres  Total critical care time: 50 minutes  Critical care time was exclusive of separately billable procedures and treating other patients.  Critical care was necessary to treat or prevent imminent or life-threatening deterioration.  Critical care was time spent personally by me (independent of midlevel providers or residents) on the following activities: development of treatment plan with patient and/or surrogate as well as nursing, discussions with consultants, evaluation of patient's response to treatment, examination of patient, obtaining history from patient or surrogate, ordering and performing treatments and interventions, ordering and review of laboratory studies, ordering and review of radiographic studies, pulse oximetry and re-evaluation of patient's condition.   Length of Stay: 6  Arvilla Meres, MD  09/11/2023, 12:58 PM  Advanced Heart Failure Team Pager [phone] (M-F; 7a - 5p)  Please contact [facility] for night-coverage after hours (5p -7a ) and weekends on [email or web address]

## 2023-09-12 DIAGNOSIS — I5023 Acute on chronic systolic (congestive) heart failure: Secondary | ICD-10-CM | POA: Diagnosis not present

## 2023-09-12 LAB — CBC
HCT: 37.2 % — ABNORMAL LOW (ref 39.0–52.0)
Hemoglobin: 10.8 g/dL — ABNORMAL LOW (ref 13.0–17.0)
MCH: 18.4 pg — ABNORMAL LOW (ref 26.0–34.0)
MCHC: 29 g/dL — ABNORMAL LOW (ref 30.0–36.0)
MCV: 63.5 fL — ABNORMAL LOW (ref 80.0–100.0)
Platelets: 273 10*3/uL (ref 150–400)
RBC: 5.86 MIL/uL — ABNORMAL HIGH (ref 4.22–5.81)
RDW: 22.5 % — ABNORMAL HIGH (ref 11.5–15.5)
WBC: 6.3 10*3/uL (ref 4.0–10.5)
nRBC: 0 % (ref 0.0–0.2)

## 2023-09-12 LAB — BASIC METABOLIC PANEL
Anion gap: 7 (ref 5–15)
BUN: 71 mg/dL — ABNORMAL HIGH (ref 6–20)
CO2: 24 mmol/L (ref 22–32)
Calcium: 8.6 mg/dL — ABNORMAL LOW (ref 8.9–10.3)
Chloride: 104 mmol/L (ref 98–111)
Creatinine, Ser: 2.69 mg/dL — ABNORMAL HIGH (ref 0.61–1.24)
GFR, Estimated: 31 mL/min — ABNORMAL LOW (ref >60)
Glucose, Bld: 166 mg/dL — ABNORMAL HIGH (ref 70–99)
Potassium: 3.4 mmol/L — ABNORMAL LOW (ref 3.5–5.1)
Sodium: 135 mmol/L (ref 135–145)

## 2023-09-12 LAB — COOXEMETRY PANEL
Carboxyhemoglobin: 2.1 % — ABNORMAL HIGH (ref 0.5–1.5)
Carboxyhemoglobin: 1.7 % — ABNORMAL HIGH (ref 0.5–1.5)
Methemoglobin: <0.7 % (ref 0.0–1.5)
Methemoglobin: <0.7 % (ref 0.0–1.5)
O2 Saturation: 74.6 %
O2 Saturation: 59.7 %
Total hemoglobin: 11.2 g/dL — ABNORMAL LOW (ref 12.0–16.0)
Total hemoglobin: 11.4 g/dL — ABNORMAL LOW (ref 12.0–16.0)

## 2023-09-12 LAB — GLUCOSE, CAPILLARY
Glucose-Capillary: 161 mg/dL — ABNORMAL HIGH (ref 70–99)
Glucose-Capillary: 143 mg/dL — ABNORMAL HIGH (ref 70–99)
Glucose-Capillary: 166 mg/dL — ABNORMAL HIGH (ref 70–99)

## 2023-09-12 LAB — MAGNESIUM: Magnesium: 2 mg/dL (ref 1.7–2.4)

## 2023-09-12 MED ORDER — POTASSIUM CHLORIDE CRYS ER 20 MEQ PO TBCR
60.0000 meq | EXTENDED_RELEASE_TABLET | Freq: Once | ORAL | Status: AC
  Administered 2023-09-12: 60 meq via ORAL
  Filled 2023-09-12: qty 3

## 2023-09-12 MED ORDER — INSULIN ASPART 100 UNIT/ML IJ SOLN: 0.0000 [IU] | Freq: Three times a day (TID) | INTRAMUSCULAR | Status: AC

## 2023-09-12 MED ORDER — POTASSIUM CHLORIDE CRYS ER 20 MEQ PO TBCR
40.0000 meq | EXTENDED_RELEASE_TABLET | Freq: Once | ORAL | Status: AC
Start: 2023-09-12 — End: 2023-09-12
  Administered 2023-09-12: 40 meq via ORAL
  Filled 2023-09-12: qty 2

## 2023-09-12 MED ORDER — INSULIN ASPART 100 UNIT/ML IJ SOLN: 3.0000 [IU] | Freq: Three times a day (TID) | INTRAMUSCULAR | Status: AC

## 2023-09-12 MED ORDER — INSULIN GLARGINE-YFGN 100 UNIT/ML ~~LOC~~ SOLN: 8.0000 [IU] | Freq: Every day | SUBCUTANEOUS | Status: AC

## 2023-09-12 MED ORDER — NOREPINEPHRINE 4 MG/250ML-% IV SOLN: 2.0000 ug/min | INTRAVENOUS | Status: AC

## 2023-09-12 MED ORDER — ENOXAPARIN SODIUM 40 MG/0.4ML IJ SOSY: 40.0000 mg | PREFILLED_SYRINGE | INTRAMUSCULAR | Status: AC

## 2023-09-12 MED ORDER — MILRINONE LACTATE IN DEXTROSE 20-5 MG/100ML-% IV SOLN: 0.5000 ug/kg/min | INTRAVENOUS | Status: AC

## 2023-09-12 MED ORDER — FUROSEMIDE 10 MG/ML IJ SOLN: 25.0000 mg/h | INTRAVENOUS | Status: AC

## 2023-09-12 MED ORDER — INSULIN ASPART 100 UNIT/ML IJ SOLN: 0.0000 [IU] | Freq: Every day | INTRAMUSCULAR | Status: AC

## 2023-09-12 MED ORDER — METOLAZONE 2.5 MG PO TABS
2.5000 mg | ORAL_TABLET | Freq: Once | ORAL | Status: AC
  Administered 2023-09-12: 2.5 mg via ORAL
  Filled 2023-09-12: qty 1

## 2023-09-12 MED ORDER — AMIODARONE HCL 400 MG PO TABS: 400.0000 mg | ORAL_TABLET | Freq: Two times a day (BID) | ORAL | Status: AC

## 2023-09-12 MED ORDER — ONDANSETRON HCL 4 MG/2ML IJ SOLN: 4.0000 mg | Freq: Four times a day (QID) | INTRAMUSCULAR | Status: AC | PRN

## 2023-09-12 MED ORDER — MUPIROCIN CALCIUM 2 % EX CREA: TOPICAL_CREAM | Freq: Every day | CUTANEOUS | Status: AC

## 2023-09-12 NOTE — Plan of Care (Signed)
Patient to transfer to [facility] tonight.

## 2023-09-12 NOTE — Progress Notes (Signed)
Orthopedic Tech Progress Note Patient Details:  ELMA WYDRA Aug 30, 1986 [id number]  Patient ID: Aniceto Boss Kief, male   DOB: [birth year], 37 y.o.   MRN: [id number] I went to apply the unna boots the rn said not to because the patient was being transferred to [facility] and they wanted to see the legs. Trinna Post 09/12/2023, 7:48 PM

## 2023-09-12 NOTE — Progress Notes (Addendum)
Advanced Heart Failure Rounding Note  PCP-Cardiologist: None   Subjective:    RHC 11/15 On milrinone 0.375 and NE 3 Ao = 107/79 (92) LV = 111/27 RA = 10 RV = 53/10 PA =  46/30 (37) PCW = 20 Fick cardiac output/index = 7.3/3.1 Thermo CP/CI = 5.5/2.3 PVR = 2.3 (Fick) 3.1 (Thermo) Ao sat = 95% PA sat = 66%, 68% PAPi = 1.6  Milrinone increased to 0.5 on 11/16.     CO-OX 74.6>>59.7% on 0.5 milrinone + 2 NE.   CVP 10. 3.3L UOP last 24 hrs with lasix gtt at 25/hr + metolazone 2.5 mg.   Feeling well. No dyspnea. Has been ambulating around the room. Anxious to get out of ICU.  Objective:   Weight Range: 110.2 kg Body mass index is 31.19 kg/m.   Vital Signs:   Temp:  [97.5 F (36.4 C)-98.6 F (37 C)] 98.6 F (37 C) (11/18 0700) Pulse Rate:  [103-122] 106 (11/18 0616) Resp:  [11-25] 15 (11/18 0616) BP: (106-126)/(67-96) 126/88 (11/18 0615) SpO2:  [93 %-99 %] 97 % (11/18 0616) Weight:  [110.2 kg] 110.2 kg (11/18 0500) Last BM Date : 09/11/23  Weight change: Filed Weights   09/10/23 0500 09/11/23 0451 09/12/23 0500  Weight: 109.6 kg 110.2 kg 110.2 kg    Intake/Output:   Intake/Output Summary (Last 24 hours) at 09/12/2023 0743 Last data filed at 09/12/2023 0615 Gross per 24 hour  Intake 1438.69 ml  Output 3300 ml  Net -1861.31 ml     Physical Exam   General:  Well appearing. Sitting up in chair. HEENT: normal Neck: supple. JVP 10-12. Carotids 2+ bilat; no bruits.  Cor: PMI nondisplaced. Regular rate & rhythm, tachy. No rubs, gallops or murmurs. Lungs: clear Abdomen: soft, nontender, nondistended.  Extremities: no cyanosis, clubbing, rash, 2+ edema in thighs, + UNNA Neuro: alert & orientedx3. Affect pleasant    Telemetry   ST 110s  Labs    CBC Recent Labs    09/11/23 0417 09/12/23 0518  WBC 6.8 6.3  HGB 10.8* 10.8*  HCT 37.1* 37.2*  MCV 63.2* 63.5*  PLT 263 273   Basic Metabolic Panel Recent Labs    16/10/96 0417 09/12/23 0518   NA 136 135  K 3.8 3.4*  CL 106 104  CO2 23 24  GLUCOSE 223* 166*  BUN 72* 71*  CREATININE 2.52* 2.69*  CALCIUM 8.2* 8.6*  MG 2.1 2.0   Liver Function Tests No results for input(s): "AST", "ALT", "ALKPHOS", "BILITOT", "PROT", "ALBUMIN" in the last 72 hours.  No results for input(s): "LIPASE", "AMYLASE" in the last 72 hours. Cardiac Enzymes No results for input(s): "CKTOTAL", "CKMB", "CKMBINDEX", "TROPONINI" in the last 72 hours.  BNP: BNP (last 3 results) Recent Labs    08/18/23 1622 09/05/23 1124  BNP 4,064.5* 3,881.1*    ProBNP (last 3 results) No results for input(s): "PROBNP" in the last 8760 hours.   D-Dimer No results for input(s): "DDIMER" in the last 72 hours. Hemoglobin A1C No results for input(s): "HGBA1C" in the last 72 hours.  Fasting Lipid Panel No results for input(s): "CHOL", "HDL", "LDLCALC", "TRIG", "CHOLHDL", "LDLDIRECT" in the last 72 hours.  Thyroid Function Tests No results for input(s): "TSH", "T4TOTAL", "T3FREE", "THYROIDAB" in the last 72 hours.  Invalid input(s): "FREET3"   Other results:   Imaging   No results found.  Medications:     Scheduled Medications:  amiodarone  400 mg Oral BID   atorvastatin  40 mg Oral  Daily   busPIRone  15 mg Oral BID   Chlorhexidine Gluconate Cloth  6 each Topical Daily   enoxaparin (LOVENOX) injection  40 mg Subcutaneous Q24H   insulin aspart  0-15 Units Subcutaneous TID WC   insulin aspart  0-5 Units Subcutaneous QHS   insulin aspart  3 Units Subcutaneous TID WC   insulin glargine-yfgn  8 Units Subcutaneous Daily   lidocaine  1 patch Transdermal Q24H   mupirocin cream   Topical Daily   sodium chloride flush  10-40 mL Intracatheter Q12H   sodium chloride flush  3 mL Intravenous Q12H   sodium chloride flush  3 mL Intravenous Q12H    Infusions:  furosemide (LASIX) 200 mg in dextrose 5 % 100 mL (2 mg/mL) infusion 25 mg/hr (09/12/23 0600)   milrinone 0.5 mcg/kg/min (09/12/23 0600)    norepinephrine (LEVOPHED) Adult infusion 2 mcg/min (09/12/23 0600)    PRN Medications: acetaminophen, ondansetron (ZOFRAN) IV, mouth rinse, sodium chloride flush, sodium chloride flush, sodium chloride flush  Patient Profile   [name] is a 37 y.o. male with past medical history of hypertension, hyperlipidemia, insulin-dependent diabetes mellitus since 37 years of age, CKD stage IIIb, OSA and chronic systolic heart failure.  Admitted with acute on chronic systolic CHF with low-output.    Assessment/Plan   1. Acute on Chronic Biventricular Heart Failure:  - Mixed ischemic/NICM cardiomyopathy of uncertain etiology.   - He has had HTN since his teens, could be due to long-standing poorly controlled HTN. With DM/HTN/hyperlipidemia x years,  - cMRI limited study, difficult images due to problems with breath-holding. >50% wall thickness subendocardial LGE in the mid anteroseptal wall, looks like a coronary disease pattern suggestive of prior MI though does not seem extensive enough to cause his degree of LV dysfunction. LVEF 15% on cMRI.   - CPX 09/23: severe functional limitation d/t advanced HF and obesity, peak VO2 15, VE/VCO2 slope 45. - Now admitted with low-output heart failure. - Echo this admit: EF < 20%, RV okay, RVSP 68 mmHg, moderate MR - RHC 11/15 with mild volume overload and preserved CO on NE 3 milrinone 0.375 - Cath this admit with occluded mid LAD with R-L collats otherwise minimal CAD - CO-OX 74.6% (lab error confirmed) > 59.7 on milrinone 0.5 + 2 NE. - Remains volume up. Continue lasix gtt at 25. Give metolazone x 1 again today. Supp K today. - NYHA IV with prominent s3 and marked sinus tach - GDMT limited by CKD Stage IIIb and low output.  - He has end-stage biventricular HF. Ideally he will need dual organ transplant. Workup initiated. Case previously discussed with [facility]. Needs further optimization and better control of diabetes (A1c 10). May need to consider bridge VAD  but renal function an issue.  - Unfortunately, do not see any way we can get him home safely to go to [facility] for outpatient transplant evaluation. Dr. Gala Villa reaching back out to discuss inpatient transfer.  2. AKI on CKD stage 3b:  - [facility] to DM2/HTN - Creatinine baseline is ~ 2.4-2.6. - Scr 2.04 -> 2.23 -> 2.52 -> 2.69 today - follow BMET - Renal US with evidence of medical renal disease.   3. Type 2 diabetes:  - Early onset.  - A1c 10.0 - On insulin - Diabetes coordinator consulted.  4. HTN:  - Early onset in teens.  - GDMT per above   5. Hyperlipidemia:  - Continue atorvastatin.   6. Diabetic foot s/p toe amputation.   7.  Mitral regurgitation:  - Echo this admit with moderate MR.  8. OSA: - Needs CPAP, has not been able to tolerate. He has a new mask but hasn't tried it yet.   9. Iron deficiency anemia: - T sat 6% and Ferritin 21.  - May eventually benefit from IV iron.  10. NSVT - Multiple runs on telemetry this admit, no recurrence after adding PO amiodarone - Supp K, 3.4 today. Mag 2.0.    Length of Stay: 7  Jeffery Villa N, PA-C  09/12/2023, 7:43 AM  Advanced Heart Failure Team Pager [phone] (M-F; 7a - 5p)  Please contact [facility] for night-coverage after hours (5p -7a ) and weekends on [email or web address]

## 2023-09-12 NOTE — Discharge Summary (Addendum)
Advanced Heart Failure Team  Discharge Summary   Patient ID: Jeffery Villa MRN: [id number], DOB/AGE: [birth year] 35 y.o. 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 male with past medical history of HTN, HLD, insulin-dependent DM II since 37 years of age (unaware of prior autoantibody testing), CKD IIIb, OSA and chronic systolic CHF.   Heart failure dates back to 2022. Had initial echo in 09/22, EF 20-25% with moderate to severe MR and moderate to severe TR. He saw a cardiologist at [facility], 10/22. He did not follow back up. He wore the LifeVest for about a week before he discontinued it.     Admitted 5/23 to [facility] with left lower extremity cellulitis and acute on chronic CHF requiring inotrope support with milrinone. Echo: EF 20-25%, mod-severe MR (severe by PISA ERO), moderate TR.  RHC: mildly elevated PCWP and normal RA pressure with mild pulmonary venous hypertension, CI 3.33 (off milrinone x 1 hour). No LHC with elevated SCr. Cardiac MRI: >50% wall thickness subendocardial LGE in the mid anteroseptal wall, looked like a coronary disease pattern suggestive of prior MI. LVEF 15%.    Echo in 8/23: EF 20% with severe LV dilation, mildly reduced RV, mod-severe TR, mod-severe MR, PASP 64 mmHg. TEE 9/23 to assess MR, this showed EF 20-25%, LVESD 6.4 cm, PASP 69, moderately decreased RV function, moderate functional MR, dilated IVC, severe TR.    CPX 10/23: severe functional limitation d/t advanced HF and morbid obesity. VE/VCO2 slope severely elevated at 45.  He was lost to follow-up until 10/24 when he presented with massive volume overload and concern for low-output HF. Had been off several heart failure medications.  Declined admission at that time.  Presented for outpatient f/u 09/05/23 and directly admitted from the clinic for acute on chronic systolic CHF with suspected low-output. Lactic acid not elevated but initial CO-OX 33%. He was initiated on inotrope support with milrinone and started on IV diuresis. CO-OX dropped to 45% and diuresis was sluggish despite titrating milrinone and converting to lasix gtt. He was transferred to the ICU for additional support with norepinephrine.  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 SWAN-Ganz catheter left in place but later discontinued after it dislodged. Hemodynamics have remained marginal despite inotrope support. Case discussed with Dr. Edwena Blow at [facility], he was accepted for transfer to his service for consideration of dual transplant (heart/kidney).    Last echo, cardiac cath and dopplers sent to [facility] via [facility].  See below for hospital course by problem.    Hospital Course by Problem: 1. Acute on Chronic Biventricular Heart Failure:  - Mixed ischemic/NICM cardiomyopathy of uncertain etiology.   - He has had HTN since his teens, could be due to long-standing poorly controlled HTN. With DM/HTN/hyperlipidemia x years,  - cMRI limited study, difficult images due to problems with breath-holding. >50% wall thickness subendocardial LGE in the mid anteroseptal wall, looks like a coronary disease pattern suggestive of prior MI though does not seem extensive enough to cause his degree of LV dysfunction. LVEF 15% on cMRI.   - CPX 09/23: severe functional limitation d/t advanced HF  and obesity, peak VO2 15, VE/VCO2 slope 45. - Now admitted with low-output heart failure. - Echo this admit: EF < 20%, RV okay, RVSP 68 mmHg, moderate MR - RHC 11/15 with mild volume overload and preserved CO on NE 3 milrinone 0.375 - Cath this admit with occluded mid LAD with R-L  collats otherwise minimal CAD - CO-OX 74.6% (lab error confirmed) > 59.7 on milrinone 0.5 + 2 NE. - Remains volume up. Continue lasix gtt at 25. Give metolazone x 1 again today. Supp K today. - NYHA IV with prominent s3 and marked sinus tach - GDMT limited by CKD Stage IIIb and low output.  - He has end-stage biventricular HF. Ideally he will need dual organ transplant. Workup initiated. Case previously discussed with [facility]. Needs further optimization and better control of diabetes (A1c 10). May need to consider bridge VAD but renal function an issue.  - Unfortunately, do not see any way we can get him home safely to go to [facility] for outpatient transplant evaluation. Dr. Elwyn Lade discussed with [name] again today and he was accepted under his service for transplant evaluation.   2. AKI on CKD stage 3b:  - CKD due to DM2/HTN and likely component of cardiorenal syndrome - Creatinine baseline is ~ 2.4-2.6. - Scr 2.04 -> 2.23 -> 2.52 -> 2.69 today - follow BMET - Renal US with evidence of medical renal disease.    3. Type 2 diabetes:  - Early onset.  - He does not believe he ever had autoantibody testing - A1c 10.0 - On insulin - Diabetes coordinator consulted.   4. HTN:  - Early onset in teens.  - BP not currently elevated   5. Hyperlipidemia:  - Continue atorvastatin.    6. Diabetic foot s/p toe amputation.    7. Mitral regurgitation:  - Echo this admit with moderate MR.   8. OSA: - Needs CPAP, has not been able to tolerate. He has a new mask but hasn't tried it yet.    9. Iron deficiency anemia: - T sat 6% and Ferritin 21.  - May eventually benefit from IV iron.   10. NSVT - Multiple runs on telemetry this admit, no recurrence after adding PO amiodarone - Supp K, 3.4 today. Mag 2.0.    Discharge Vitals: Blood pressure (!) 123/92, pulse (!) 114, temperature (!) 97.5 F (36.4 C), temperature source Oral, resp. rate 18, height 6\' 2"  (1.88 m), weight 110.2 kg, SpO2  96%.  Labs: Lab Results  Component Value Date   WBC 6.3 09/12/2023   HGB 10.8 (L) 09/12/2023   HCT 37.2 (L) 09/12/2023   MCV 63.5 (L) 09/12/2023   PLT 273 09/12/2023    Recent Labs  Lab 09/08/23 1823 09/09/23 0405 09/12/23 0518  NA 129*   < > 135  K 3.8   < > 3.4*  CL 99   < > 104  CO2 22   < > 24  BUN 73*   < > 71*  CREATININE 2.11*   < > 2.69*  CALCIUM 8.3*   < > 8.6*  PROT 6.1*  --   --   BILITOT 0.9  --   --   ALKPHOS 73  --   --   ALT 24  --   --   AST 22  --   --   GLUCOSE 436*   < > 166*   < > = values in this interval not displayed.   Lab Results  Component Value Date  CHOL 117 09/06/2023   HDL 22 (L) 09/06/2023   LDLCALC 73 09/06/2023   TRIG 112 09/06/2023   BNP (last 3 results) Recent Labs    08/18/23 1622 09/05/23 1124  BNP 4,064.5* 3,881.1*    ProBNP (last 3 results) No results for input(s): "PROBNP" in the last 8760 hours.   Diagnostic Studies/Procedures   [facility] 09/09/23: Findings:   On milrinone 0.375 and NE 3   Ao = 107/79 (92) LV = 111/27 RA = 10 RV = 53/10 PA =  46/30 (37) PCW = 20 Fick cardiac output/index = 7.3/3.1 Thermo CP/CI = 5.5/2.3 PVR = 2.3 (Fick) 3.1 (Thermo) Ao sat = 95% PA sat = 66%, 68% PAPi = 1.6   Assessment: 1. 1v CAD with CTO of mLAD with R-> L collaterals 2. Severe mixed ischemic/nonischemic CM with biventricular failure 3. Elevated filling pressures 4. Low PAPi suggestive of RV dysfunction   Echo 09/06/23: IMPRESSIONS   1. Left ventricular ejection fraction, by estimation, is <20%. The left ventricle has severely decreased function. The left ventricle demonstrates global hypokinesis. The left ventricular internal cavity size was severely dilated. Left ventricular diastolic function could not be evaluated.   2. Right ventricular systolic function is normal. The right ventricular size is mildly enlarged. There is severely elevated pulmonary artery systolic pressure. The estimated right ventricular  systolic pressure is 68.6 mmHg.   3. Left atrial size was severely dilated.   4. Right atrial size was severely dilated.   5. The mitral valve is normal in structure. Moderate mitral valve regurgitation. No evidence of mitral stenosis.   6. Tricuspid valve regurgitation is moderate.   7. The aortic valve is normal in structure. Aortic valve regurgitation is  not visualized. No aortic stenosis is present.   8. The inferior vena cava is dilated in size with <50% respiratory variability, suggesting right atrial pressure of 15 mmHg.    Discharge Medications   Allergies as of 09/12/2023   No Known Allergies      Medication List     STOP taking these medications    blood glucose meter kit and supplies   glipiZIDE 5 MG tablet Commonly known as: GLUCOTROL   hydrALAZINE 50 MG tablet Commonly known as: APRESOLINE   isosorbide mononitrate 30 MG 24 hr tablet Commonly known as: IMDUR   metolazone 2.5 MG tablet Commonly known as: ZAROXOLYN   potassium chloride SA 20 MEQ tablet Commonly known as: KLOR-CON M   torsemide 20 MG tablet Commonly known as: DEMADEX       TAKE these medications    amiodarone 400 MG tablet Commonly known as: PACERONE Take 1 tablet (400 mg total) by mouth 2 (two) times daily.   atorvastatin 40 MG tablet Commonly known as: LIPITOR Take 1 tablet (40 mg total) by mouth daily. What changed: when to take this   busPIRone 15 MG tablet Commonly known as: BUSPAR Take 15 mg by mouth 2 (two) times daily.   enoxaparin 40 MG/0.4ML injection Commonly known as: LOVENOX Inject 0.4 mLs (40 mg total) into the skin daily. Start taking on: September 13, 2023   furosemide 200 mg in dextrose 5 % 80 mL Inject 25 mg/hr into the vein continuous.   insulin aspart 100 UNIT/ML injection Commonly known as: novoLOG Inject 0-15 Units into the skin 3 (three) times daily with meals.   insulin aspart 100 UNIT/ML injection Commonly known as: novoLOG Inject 0-5 Units  into the skin at bedtime.   insulin aspart 100 UNIT/ML injection  Commonly known as: novoLOG Inject 3 Units into the skin 3 (three) times daily with meals.   insulin glargine-yfgn 100 UNIT/ML injection Commonly known as: SEMGLEE Inject 0.08 mLs (8 Units total) into the skin daily. Start taking on: September 13, 2023   milrinone 20 MG/100 ML Soln infusion Commonly known as: PRIMACOR Inject 0.0554 mg/min into the vein continuous.   mupirocin cream 2 % Commonly known as: BACTROBAN Apply topically daily. Start taking on: September 13, 2023   norepinephrine 4-5 MG/250ML-% Soln Commonly known as: LEVOPHED Inject 2-10 mcg/min into the vein continuous.   ondansetron 4 MG/2ML Soln injection Commonly known as: ZOFRAN Inject 2 mLs (4 mg total) into the vein every 6 (six) hours as needed for nausea.        Disposition   The patient will be discharged for transfer to [facility] via [facility].     Duration of Discharge Encounter: Greater than 35 minutes   Signed, Henson Fraticelli N  09/12/2023, 4:20 PM

## 2023-09-12 NOTE — Discharge Summary (Deleted)
Advanced Heart Failure Team  Discharge Summary   Patient ID: Jeffery Villa MRN: [id number], DOB/AGE: 1986-03-23 37 y.o. 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 male with past medical history of HTN, HLD, insulin-dependent DM II since 37 years of age (unaware of prior autoantibody testing), CKD IIIb, OSA and chronic systolic CHF.   Heart failure dates back to 2022. Had initial echo in 09/22, EF 20-25% with moderate to severe MR and moderate to severe TR. He saw a cardiologist at [facility], 10/22. He did not follow back up. He wore the LifeVest for about a week before he discontinued it.     Admitted 5/23 to [facility] with left lower extremity cellulitis and acute on chronic CHF requiring inotrope support with milrinone. Echo: EF 20-25%, mod-severe MR (severe by PISA ERO), moderate TR.  RHC: mildly elevated PCWP and normal RA pressure with mild pulmonary venous hypertension, CI 3.33 (off milrinone x 1 hour). No LHC with elevated SCr. Cardiac MRI: >50% wall thickness subendocardial LGE in the mid anteroseptal wall, looked like a coronary disease pattern suggestive of prior MI. LVEF 15%.    Echo in 8/23: EF 20% with severe LV dilation, mildly reduced RV, mod-severe TR, mod-severe MR, PASP 64 mmHg. TEE 9/23 to assess MR, this showed EF 20-25%, LVESD 6.4 cm, PASP 69, moderately decreased RV function, moderate functional MR, dilated IVC, severe TR.    CPX 10/23: severe functional limitation d/t advanced HF and morbid obesity. VE/VCO2 slope severely elevated at 45.  He was lost to follow-up until 10/24 when he presented with massive volume overload and concern for low-output HF. Had been off several heart failure medications.  Declined admission at that time.  Presented for outpatient f/u 09/05/23 and directly admitted from the clinic for acute on chronic systolic CHF with suspected low-output. Lactic acid not elevated but initial CO-OX 33%. He was initiated on inotrope support with milrinone and started on IV diuresis. CO-OX dropped to 45% and diuresis was sluggish despite titrating milrinone and converting to lasix gtt. He was transferred to the ICU for additional support with norepinephrine.  R/LHC 09/09/23: 1 v CAD with CTO of mLAD with collaterals, elevated filling pressures, Fick CI 3.1, Thermo CI 2.3, PAPi 1.6. SWAN-Ganz catheter left in place but later discontinued after it dislodged. Hemodynamics have remained marginal despite inotrope support. Case discussed with Dr. Edwena Blow at [facility], he was accepted for transfer to his service for consideration of dual transplant (heart/kidney).  See below for hospital course by problem.    Hospital Course by Problem: 1. Acute on Chronic Biventricular Heart Failure:  - Mixed ischemic/NICM cardiomyopathy of uncertain etiology.   - He has had HTN since his teens, could be due to long-standing poorly controlled HTN. With DM/HTN/hyperlipidemia x years,  - cMRI limited study, difficult images due to problems with breath-holding. >50% wall thickness subendocardial LGE in the mid anteroseptal wall, looks like a coronary disease pattern suggestive of prior MI though does not seem extensive enough to cause his degree of LV dysfunction. LVEF 15% on cMRI.   - CPX 09/23: severe functional limitation d/t advanced HF and obesity, peak VO2 15, VE/VCO2 slope 45. - Now admitted with low-output heart failure. - Echo this admit: EF <  20%, RV okay, RVSP 68 mmHg, moderate MR - RHC 11/15 with mild volume overload and preserved CO on NE 3 milrinone 0.375 - Cath this admit with occluded mid LAD with R-L collats otherwise minimal CAD - CO-OX 74.6% (lab error confirmed) > 59.7 on milrinone 0.5 + 2 NE. -  Remains volume up. Continue lasix gtt at 25. Give metolazone x 1 again today. Supp K today. - NYHA IV with prominent s3 and marked sinus tach - GDMT limited by CKD Stage IIIb and low output.  - He has end-stage biventricular HF. Ideally he will need dual organ transplant. Workup initiated. Case previously discussed with [facility]. Needs further optimization and better control of diabetes (A1c 10). May need to consider bridge VAD but renal function an issue.  - Unfortunately, do not see any way we can get him home safely to go to [facility] for outpatient transplant evaluation. Dr. Elwyn Lade discussed with [name] again today and he was accepted under his service for transplant evaluation.   2. AKI on CKD stage 3b:  - CKD due to DM2/HTN and likely component of cardiorenal syndrome - Creatinine baseline is ~ 2.4-2.6. - Scr 2.04 -> 2.23 -> 2.52 -> 2.69 today - follow BMET - Renal US with evidence of medical renal disease.    3. Type 2 diabetes:  - Early onset.  - He does not believe he ever had autoantibody testing - A1c 10.0 - On insulin - Diabetes coordinator consulted.   4. HTN:  - Early onset in teens.  - BP not currently elevated   5. Hyperlipidemia:  - Continue atorvastatin.    6. Diabetic foot s/p toe amputation.    7. Mitral regurgitation:  - Echo this admit with moderate MR.   8. OSA: - Needs CPAP, has not been able to tolerate. He has a new mask but hasn't tried it yet.    9. Iron deficiency anemia: - T sat 6% and Ferritin 21.  - May eventually benefit from IV iron.   10. NSVT - Multiple runs on telemetry this admit, no recurrence after adding PO amiodarone - Supp K, 3.4 today. Mag 2.0.    Discharge Vitals: Blood pressure (!) 123/92, pulse (!) 114, temperature (!) 97.5 F (36.4 C), temperature source Oral, resp. rate 18, height 6\' 2"  (1.88 m), weight 110.2 kg, SpO2 96%.  Labs: Lab Results  Component Value Date   WBC 6.3 09/12/2023   HGB 10.8 (L) 09/12/2023   HCT 37.2 (L)  09/12/2023   MCV 63.5 (L) 09/12/2023   PLT 273 09/12/2023    Recent Labs  Lab 09/08/23 1823 09/09/23 0405 09/12/23 0518  NA 129*   < > 135  K 3.8   < > 3.4*  CL 99   < > 104  CO2 22   < > 24  BUN 73*   < > 71*  CREATININE 2.11*   < > 2.69*  CALCIUM 8.3*   < > 8.6*  PROT 6.1*  --   --   BILITOT 0.9  --   --   ALKPHOS 73  --   --   ALT 24  --   --   AST 22  --   --   GLUCOSE 436*   < > 166*   < > = values in this interval not displayed.   Lab Results  Component Value Date   CHOL 117 09/06/2023   HDL 22 (L) 09/06/2023   LDLCALC 73 09/06/2023   TRIG 112 09/06/2023  BNP (last 3 results) Recent Labs    08/18/23 1622 09/05/23 1124  BNP 4,064.5* 3,881.1*    ProBNP (last 3 results) No results for input(s): "PROBNP" in the last 8760 hours.   Diagnostic Studies/Procedures   [facility] 09/09/23: Findings:   On milrinone 0.375 and NE 3   Ao = 107/79 (92) LV = 111/27 RA = 10 RV = 53/10 PA =  46/30 (37) PCW = 20 Fick cardiac output/index = 7.3/3.1 Thermo CP/CI = 5.5/2.3 PVR = 2.3 (Fick) 3.1 (Thermo) Ao sat = 95% PA sat = 66%, 68% PAPi = 1.6   Assessment: 1. 1v CAD with CTO of mLAD with R-> L collaterals 2. Severe mixed ischemic/nonischemic CM with biventricular failure 3. Elevated filling pressures 4. Low PAPi suggestive of RV dysfunction  Echo 09/06/23: IMPRESSIONS   1. Left ventricular ejection fraction, by estimation, is <20%. The left ventricle has severely decreased function. The left ventricle demonstrates global hypokinesis. The left ventricular internal cavity size was severely dilated. Left ventricular diastolic function could not be evaluated.   2. Right ventricular systolic function is normal. The right ventricular size is mildly enlarged. There is severely elevated pulmonary artery systolic pressure. The estimated right ventricular systolic pressure is 68.6 mmHg.   3. Left atrial size was severely dilated.   4. Right atrial size was severely dilated.    5. The mitral valve is normal in structure. Moderate mitral valve regurgitation. No evidence of mitral stenosis.   6. Tricuspid valve regurgitation is moderate.   7. The aortic valve is normal in structure. Aortic valve regurgitation is  not visualized. No aortic stenosis is present.   8. The inferior vena cava is dilated in size with <50% respiratory variability, suggesting right atrial pressure of 15 mmHg.   Discharge Medications   Allergies as of 09/12/2023   No Known Allergies      Medication List     STOP taking these medications    blood glucose meter kit and supplies   glipiZIDE 5 MG tablet Commonly known as: GLUCOTROL   hydrALAZINE 50 MG tablet Commonly known as: APRESOLINE   isosorbide mononitrate 30 MG 24 hr tablet Commonly known as: IMDUR   metolazone 2.5 MG tablet Commonly known as: ZAROXOLYN   potassium chloride SA 20 MEQ tablet Commonly known as: KLOR-CON M   torsemide 20 MG tablet Commonly known as: DEMADEX       TAKE these medications    amiodarone 400 MG tablet Commonly known as: PACERONE Take 1 tablet (400 mg total) by mouth 2 (two) times daily.   atorvastatin 40 MG tablet Commonly known as: LIPITOR Take 1 tablet (40 mg total) by mouth daily. What changed: when to take this   busPIRone 15 MG tablet Commonly known as: BUSPAR Take 15 mg by mouth 2 (two) times daily.   enoxaparin 40 MG/0.4ML injection Commonly known as: LOVENOX Inject 0.4 mLs (40 mg total) into the skin daily. Start taking on: September 13, 2023   furosemide 200 mg in dextrose 5 % 80 mL Inject 25 mg/hr into the vein continuous.   insulin aspart 100 UNIT/ML injection Commonly known as: novoLOG Inject 0-15 Units into the skin 3 (three) times daily with meals.   insulin aspart 100 UNIT/ML injection Commonly known as: novoLOG Inject 0-5 Units into the skin at bedtime.   insulin aspart 100 UNIT/ML injection Commonly known as: novoLOG Inject 3 Units into the skin 3  (three) times daily with meals.   insulin glargine-yfgn 100 UNIT/ML injection  Commonly known as: SEMGLEE Inject 0.08 mLs (8 Units total) into the skin daily. Start taking on: September 13, 2023   milrinone 20 MG/100 ML Soln infusion Commonly known as: PRIMACOR Inject 0.0554 mg/min into the vein continuous.   mupirocin cream 2 % Commonly known as: BACTROBAN Apply topically daily. Start taking on: September 13, 2023   norepinephrine 4-5 MG/250ML-% Soln Commonly known as: LEVOPHED Inject 2-10 mcg/min into the vein continuous.   ondansetron 4 MG/2ML Soln injection Commonly known as: ZOFRAN Inject 2 mLs (4 mg total) into the vein every 6 (six) hours as needed for nausea.        Disposition   The patient will be discharged in stable condition to home. Discharge Instructions     Diet - low sodium heart healthy   Complete by: As directed           Duration of Discharge Encounter: Greater than 35 minutes   Signed, Akiya Morr N  09/12/2023, 4:21 PM

## 2023-09-12 NOTE — TOC Progression Note (Signed)
Transition of Care [facility]) - Progression Note    Patient Details  Name: Jeffery Villa MRN: [id number] Date of Birth: [birth year]  Transition [facility]) CM/SW Contact  Nicanor Bake Phone Number: [phone] 09/12/2023, 9:31 AM  Clinical Narrative:   HF CSW spoke with pt over the phone to work on completing the pts Disability Assistance Referral Application through the [facility]. CSW completed and submitted the application with pts permission. Financial counselors also followed up and stated that they are also working on things for the pt.   TOC will continue following.     Expected Discharge Plan: Home w Home Health Services Barriers to Discharge: Continued Medical Work up  Expected Discharge Plan and Services   Discharge Planning Services: CM Consult Post Acute Care Choice: Home Health                             HH Arranged: RN [facility] Agency: [facility] Date HH Agency Contacted: 09/07/23 Time HH Agency Contacted: 1139 Representative spoke with at [facility] Agency: Jeri Modena RN, [facility]   Social Determinants of Health (SDOH) Interventions SDOH Screenings   Food Insecurity: No Food Insecurity (09/05/2023)  Housing: Patient Unable To Answer (09/07/2023)  Transportation Needs: No Transportation Needs (09/05/2023)  Utilities: Not At Risk (09/05/2023)  Alcohol Screen: Low Risk  (09/07/2023)  Financial Resource Strain: Low Risk  (07/14/2023)   Received from [facility]  Physical Activity: Sufficiently Active (10/21/2020)   Received from [facility], [facility]  Social Connections: Unknown (03/04/2022)   Received from [facility], [facility]  Stress: Stress Concern Present (10/21/2020)   Received from [facility], [facility]  Tobacco Use: Unknown (09/05/2023)    Readmission Risk Interventions     No data to display

## 2023-09-13 LAB — FACTOR 5 LEIDEN

## 2023-11-09 ENCOUNTER — Other Ambulatory Visit (HOSPITAL_COMMUNITY): Payer: Self-pay | Admitting: Cardiology

## 2023-12-07 ENCOUNTER — Telehealth (HOSPITAL_COMMUNITY): Payer: Self-pay

## 2023-12-07 NOTE — Telephone Encounter (Signed)
Outside/paper referral received by Dr. Jeanie Sewer from [facility]. Will fax over Physician order and request further documents. Insurance benefits and eligibility to be determined.

## 2023-12-07 NOTE — Telephone Encounter (Signed)
Called patient to see if he is interested in the Cardiac Rehab Program. Patient expressed interest. Explained scheduling process, patient verbalized understanding.    Will pass to RN for review.

## 2024-04-25 NOTE — Procedures (Signed)
 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 After informed consent was obtained, the patient was taken to the biopsy suite where a time out was performed.  The right neck was cleaned using betadine and draped in usual fashion. Lidocaine  was given subcutaneously for local anesthesia. Using ultrasound guidance, the right internal jugular vein was accessed using a micropuncture needle and we retained a representative image (see image). The modified Seldinger technique was then used to cannulate the vein with a 8 [place] sheath. The bioptome was then passed through the sheath, across the tricuspid valve, and an endomyocardial biopsy was performed in the right ventricle under echocardiographic guidance. This was repeated until adequate endomyocardial samples were obtained and samples were sent for analysis. After biopsies, the sheath was removed and manual pressure applied until hemostasis was achieved. The patient was then returned to the examination room. There were no apparent complications.     Alm HERO. Holmes, MD, PhD [facility] Cardiology Fellow
